# Patient Record
Sex: Male | Born: 1935 | Race: Black or African American | Hispanic: No | Smoking: Former smoker
Health system: Southern US, Community
[De-identification: ages and names within clinical notes are randomized; demographics above are authoritative.]

## PROBLEM LIST (undated history)

## (undated) DIAGNOSIS — N4340 Spermatocele of epididymis, unspecified: Secondary | ICD-10-CM

## (undated) DIAGNOSIS — M1990 Unspecified osteoarthritis, unspecified site: Secondary | ICD-10-CM

## (undated) DIAGNOSIS — I509 Heart failure, unspecified: Secondary | ICD-10-CM

## (undated) DIAGNOSIS — R339 Retention of urine, unspecified: Secondary | ICD-10-CM

## (undated) DIAGNOSIS — E291 Testicular hypofunction: Secondary | ICD-10-CM

## (undated) DIAGNOSIS — C61 Malignant neoplasm of prostate: Secondary | ICD-10-CM

## (undated) DIAGNOSIS — E119 Type 2 diabetes mellitus without complications: Secondary | ICD-10-CM

## (undated) DIAGNOSIS — M109 Gout, unspecified: Secondary | ICD-10-CM

## (undated) DIAGNOSIS — R350 Frequency of micturition: Secondary | ICD-10-CM

## (undated) DIAGNOSIS — N400 Enlarged prostate without lower urinary tract symptoms: Secondary | ICD-10-CM

## (undated) DIAGNOSIS — F0390 Unspecified dementia without behavioral disturbance: Secondary | ICD-10-CM

## (undated) DIAGNOSIS — T7840XA Allergy, unspecified, initial encounter: Secondary | ICD-10-CM

## (undated) DIAGNOSIS — F329 Major depressive disorder, single episode, unspecified: Secondary | ICD-10-CM

## (undated) DIAGNOSIS — N5089 Other specified disorders of the male genital organs: Secondary | ICD-10-CM

## (undated) DIAGNOSIS — I739 Peripheral vascular disease, unspecified: Secondary | ICD-10-CM

## (undated) DIAGNOSIS — M5430 Sciatica, unspecified side: Secondary | ICD-10-CM

## (undated) DIAGNOSIS — E785 Hyperlipidemia, unspecified: Secondary | ICD-10-CM

## (undated) DIAGNOSIS — N281 Cyst of kidney, acquired: Secondary | ICD-10-CM

## (undated) DIAGNOSIS — F419 Anxiety disorder, unspecified: Secondary | ICD-10-CM

## (undated) DIAGNOSIS — M5416 Radiculopathy, lumbar region: Secondary | ICD-10-CM

## (undated) DIAGNOSIS — R42 Dizziness and giddiness: Secondary | ICD-10-CM

## (undated) DIAGNOSIS — N139 Obstructive and reflux uropathy, unspecified: Secondary | ICD-10-CM

## (undated) DIAGNOSIS — R0600 Dyspnea, unspecified: Secondary | ICD-10-CM

## (undated) DIAGNOSIS — I2510 Atherosclerotic heart disease of native coronary artery without angina pectoris: Secondary | ICD-10-CM

## (undated) DIAGNOSIS — N430 Encysted hydrocele: Secondary | ICD-10-CM

## (undated) DIAGNOSIS — I639 Cerebral infarction, unspecified: Secondary | ICD-10-CM

## (undated) DIAGNOSIS — I7090 Unspecified atherosclerosis: Secondary | ICD-10-CM

## (undated) DIAGNOSIS — F32A Depression, unspecified: Secondary | ICD-10-CM

## (undated) DIAGNOSIS — R972 Elevated prostate specific antigen [PSA]: Secondary | ICD-10-CM

## (undated) DIAGNOSIS — I10 Essential (primary) hypertension: Secondary | ICD-10-CM

## (undated) DIAGNOSIS — N411 Chronic prostatitis: Secondary | ICD-10-CM

## (undated) DIAGNOSIS — N529 Male erectile dysfunction, unspecified: Secondary | ICD-10-CM

## (undated) DIAGNOSIS — R310 Gross hematuria: Secondary | ICD-10-CM

## (undated) DIAGNOSIS — I499 Cardiac arrhythmia, unspecified: Secondary | ICD-10-CM

## (undated) DIAGNOSIS — M48061 Spinal stenosis, lumbar region without neurogenic claudication: Secondary | ICD-10-CM

## (undated) DIAGNOSIS — H409 Unspecified glaucoma: Secondary | ICD-10-CM

## (undated) DIAGNOSIS — N62 Hypertrophy of breast: Secondary | ICD-10-CM

## (undated) DIAGNOSIS — K219 Gastro-esophageal reflux disease without esophagitis: Secondary | ICD-10-CM

## (undated) DIAGNOSIS — K649 Unspecified hemorrhoids: Secondary | ICD-10-CM

## (undated) HISTORY — DX: Unspecified osteoarthritis, unspecified site: M19.90

## (undated) HISTORY — DX: Type 2 diabetes mellitus without complications: E11.9

## (undated) HISTORY — DX: Testicular hypofunction: E29.1

## (undated) HISTORY — PX: APPENDECTOMY: SHX54

## (undated) HISTORY — DX: Other specified disorders of the male genital organs: N50.89

## (undated) HISTORY — DX: Gout, unspecified: M10.9

## (undated) HISTORY — DX: Essential (primary) hypertension: I10

## (undated) HISTORY — DX: Retention of urine, unspecified: R33.9

## (undated) HISTORY — DX: Obstructive and reflux uropathy, unspecified: N13.9

## (undated) HISTORY — DX: Unspecified hemorrhoids: K64.9

## (undated) HISTORY — DX: Male erectile dysfunction, unspecified: N52.9

## (undated) HISTORY — DX: Benign prostatic hyperplasia without lower urinary tract symptoms: N40.0

## (undated) HISTORY — DX: Allergy, unspecified, initial encounter: T78.40XA

## (undated) HISTORY — DX: Gross hematuria: R31.0

## (undated) HISTORY — DX: Sciatica, unspecified side: M54.30

## (undated) HISTORY — DX: Radiculopathy, lumbar region: M54.16

## (undated) HISTORY — DX: Spinal stenosis, lumbar region without neurogenic claudication: M48.061

## (undated) HISTORY — DX: Hypertrophy of breast: N62

## (undated) HISTORY — DX: Anxiety disorder, unspecified: F41.9

## (undated) HISTORY — DX: Elevated prostate specific antigen (PSA): R97.20

## (undated) HISTORY — DX: Hyperlipidemia, unspecified: E78.5

## (undated) HISTORY — DX: Cardiac arrhythmia, unspecified: I49.9

## (undated) HISTORY — DX: Cyst of kidney, acquired: N28.1

## (undated) HISTORY — DX: Encysted hydrocele: N43.0

## (undated) HISTORY — DX: Frequency of micturition: R35.0

## (undated) HISTORY — DX: Unspecified atherosclerosis: I70.90

## (undated) HISTORY — DX: Peripheral vascular disease, unspecified: I73.9

## (undated) HISTORY — DX: Chronic prostatitis: N41.1

## (undated) HISTORY — DX: Spermatocele of epididymis, unspecified: N43.40

## (undated) HISTORY — DX: Malignant neoplasm of prostate: C61

## (undated) HISTORY — PX: OTHER SURGICAL HISTORY: SHX169

---

## 2004-09-27 ENCOUNTER — Ambulatory Visit: Payer: Self-pay | Admitting: Unknown Physician Specialty

## 2004-12-13 ENCOUNTER — Ambulatory Visit: Payer: Self-pay | Admitting: Internal Medicine

## 2004-12-21 ENCOUNTER — Emergency Department: Payer: Self-pay | Admitting: Emergency Medicine

## 2005-01-24 ENCOUNTER — Ambulatory Visit: Payer: Self-pay | Admitting: Internal Medicine

## 2005-10-21 ENCOUNTER — Emergency Department: Payer: Self-pay | Admitting: Internal Medicine

## 2005-11-04 ENCOUNTER — Emergency Department: Payer: Self-pay | Admitting: Emergency Medicine

## 2005-11-07 ENCOUNTER — Emergency Department: Payer: Self-pay | Admitting: Emergency Medicine

## 2005-12-13 ENCOUNTER — Other Ambulatory Visit: Payer: Self-pay

## 2005-12-13 ENCOUNTER — Inpatient Hospital Stay: Payer: Self-pay | Admitting: Internal Medicine

## 2005-12-14 ENCOUNTER — Other Ambulatory Visit: Payer: Self-pay

## 2006-08-15 ENCOUNTER — Other Ambulatory Visit: Payer: Self-pay

## 2006-08-15 ENCOUNTER — Inpatient Hospital Stay: Payer: Self-pay | Admitting: Internal Medicine

## 2006-08-22 ENCOUNTER — Emergency Department: Payer: Self-pay | Admitting: Emergency Medicine

## 2006-08-22 ENCOUNTER — Other Ambulatory Visit: Payer: Self-pay

## 2007-01-09 ENCOUNTER — Ambulatory Visit: Payer: Self-pay | Admitting: Pain Medicine

## 2007-01-15 ENCOUNTER — Ambulatory Visit: Payer: Self-pay | Admitting: Pain Medicine

## 2007-03-15 ENCOUNTER — Ambulatory Visit: Payer: Self-pay | Admitting: Urology

## 2007-03-18 ENCOUNTER — Ambulatory Visit: Payer: Self-pay | Admitting: Pain Medicine

## 2007-03-31 ENCOUNTER — Ambulatory Visit: Payer: Self-pay | Admitting: Pain Medicine

## 2007-05-06 ENCOUNTER — Ambulatory Visit: Payer: Self-pay | Admitting: Pain Medicine

## 2007-05-12 ENCOUNTER — Ambulatory Visit: Payer: Self-pay | Admitting: Pain Medicine

## 2007-07-01 ENCOUNTER — Ambulatory Visit: Payer: Self-pay | Admitting: Pain Medicine

## 2007-07-16 ENCOUNTER — Ambulatory Visit: Payer: Self-pay | Admitting: Pain Medicine

## 2009-08-11 HISTORY — PX: PROSTATE BIOPSY: SHX241

## 2009-08-22 ENCOUNTER — Ambulatory Visit: Payer: Self-pay | Admitting: Urology

## 2009-08-24 ENCOUNTER — Ambulatory Visit: Payer: Self-pay | Admitting: Urology

## 2009-08-31 ENCOUNTER — Ambulatory Visit: Payer: Self-pay | Admitting: Urology

## 2009-09-16 ENCOUNTER — Ambulatory Visit: Payer: Self-pay | Admitting: Radiation Oncology

## 2009-10-14 HISTORY — PX: OTHER SURGICAL HISTORY: SHX169

## 2009-10-16 ENCOUNTER — Ambulatory Visit: Payer: Self-pay | Admitting: Radiation Oncology

## 2009-10-17 ENCOUNTER — Ambulatory Visit: Payer: Self-pay | Admitting: Radiation Oncology

## 2009-11-16 ENCOUNTER — Ambulatory Visit: Payer: Self-pay | Admitting: Radiation Oncology

## 2009-12-16 ENCOUNTER — Ambulatory Visit: Payer: Self-pay | Admitting: Radiation Oncology

## 2010-01-16 ENCOUNTER — Ambulatory Visit: Payer: Self-pay | Admitting: Radiation Oncology

## 2010-01-18 ENCOUNTER — Ambulatory Visit: Payer: Self-pay | Admitting: Radiation Oncology

## 2010-02-16 ENCOUNTER — Ambulatory Visit: Payer: Self-pay | Admitting: Radiation Oncology

## 2010-04-20 ENCOUNTER — Ambulatory Visit: Payer: Self-pay | Admitting: Urology

## 2010-05-18 ENCOUNTER — Ambulatory Visit: Payer: Self-pay | Admitting: Urology

## 2010-06-18 ENCOUNTER — Ambulatory Visit: Payer: Self-pay | Admitting: Urology

## 2010-07-21 ENCOUNTER — Ambulatory Visit: Payer: Self-pay | Admitting: Radiation Oncology

## 2010-08-17 ENCOUNTER — Ambulatory Visit: Payer: Self-pay | Admitting: Radiation Oncology

## 2011-07-23 ENCOUNTER — Ambulatory Visit: Payer: Self-pay | Admitting: Radiation Oncology

## 2011-08-17 ENCOUNTER — Ambulatory Visit: Payer: Self-pay | Admitting: Radiation Oncology

## 2011-10-03 ENCOUNTER — Ambulatory Visit: Payer: Self-pay | Admitting: Urology

## 2012-04-21 DIAGNOSIS — R339 Retention of urine, unspecified: Secondary | ICD-10-CM | POA: Insufficient documentation

## 2012-04-21 DIAGNOSIS — N4340 Spermatocele of epididymis, unspecified: Secondary | ICD-10-CM | POA: Insufficient documentation

## 2012-04-21 DIAGNOSIS — R350 Frequency of micturition: Secondary | ICD-10-CM | POA: Insufficient documentation

## 2012-04-21 DIAGNOSIS — Z8546 Personal history of malignant neoplasm of prostate: Secondary | ICD-10-CM | POA: Insufficient documentation

## 2012-04-21 DIAGNOSIS — M5430 Sciatica, unspecified side: Secondary | ICD-10-CM | POA: Insufficient documentation

## 2012-04-21 DIAGNOSIS — N430 Encysted hydrocele: Secondary | ICD-10-CM | POA: Insufficient documentation

## 2012-04-21 DIAGNOSIS — N529 Male erectile dysfunction, unspecified: Secondary | ICD-10-CM | POA: Insufficient documentation

## 2012-07-21 ENCOUNTER — Ambulatory Visit: Payer: Self-pay | Admitting: Radiation Oncology

## 2012-08-16 ENCOUNTER — Ambulatory Visit: Payer: Self-pay | Admitting: Radiation Oncology

## 2013-01-19 ENCOUNTER — Emergency Department: Payer: Self-pay | Admitting: Emergency Medicine

## 2013-01-19 LAB — CBC
HGB: 18.3 g/dL — ABNORMAL HIGH (ref 13.0–18.0)
MCH: 32.4 pg (ref 26.0–34.0)
MCHC: 35 g/dL (ref 32.0–36.0)
MCV: 92 fL (ref 80–100)
RDW: 14.4 % (ref 11.5–14.5)

## 2013-01-19 LAB — URINALYSIS, COMPLETE
Bacteria: NONE SEEN
Bilirubin,UR: NEGATIVE
Blood: NEGATIVE
Glucose,UR: NEGATIVE mg/dL (ref 0–75)

## 2013-01-19 LAB — COMPREHENSIVE METABOLIC PANEL
Anion Gap: 5 — ABNORMAL LOW (ref 7–16)
BUN: 10 mg/dL (ref 7–18)
Bilirubin,Total: 0.9 mg/dL (ref 0.2–1.0)
Calcium, Total: 9.7 mg/dL (ref 8.5–10.1)
Chloride: 103 mmol/L (ref 98–107)
Creatinine: 0.91 mg/dL (ref 0.60–1.30)
EGFR (Non-African Amer.): >60
Potassium: 4.2 mmol/L (ref 3.5–5.1)
SGOT(AST): 23 U/L (ref 15–37)

## 2013-04-20 ENCOUNTER — Encounter: Payer: Self-pay | Admitting: Podiatry

## 2013-04-20 ENCOUNTER — Ambulatory Visit (INDEPENDENT_AMBULATORY_CARE_PROVIDER_SITE_OTHER): Payer: Medicare Other | Admitting: Podiatry

## 2013-04-20 VITALS — BP 141/84 | HR 67 | Resp 18 | Ht 71.0 in | Wt 225.0 lb

## 2013-04-20 DIAGNOSIS — B351 Tinea unguium: Secondary | ICD-10-CM

## 2013-04-20 DIAGNOSIS — M79609 Pain in unspecified limb: Secondary | ICD-10-CM

## 2013-04-20 NOTE — Progress Notes (Signed)
  Subjective:    Patient ID: Darrell Clark, male    DOB: 1932-11-11, 79 y.o.   MRN: [id number]  HPI painful toe nails    Review of Systems  Constitutional: Negative.   HENT: Negative.   Eyes: Negative.   Cardiovascular: Negative.   Gastrointestinal: Negative.   Endocrine: Negative.   Genitourinary: Negative.   Allergic/Immunologic: Negative.   Neurological: Negative.   Hematological: Negative.   Psychiatric/Behavioral: Negative.        Objective:   Physical Exam: Palpable pulses bilateral lower extremity are intact. Nails are thick yellow dystrophic clinically mycotic.        Assessment & Plan:  Impression:. Pain in limb secondary to onychomycosis.  Plan: Debridement of nails 1 through 5 bilateral is cover service secondary to pain. Followup with him in 3 months.

## 2013-05-28 ENCOUNTER — Other Ambulatory Visit: Payer: Self-pay | Admitting: Urgent Care

## 2013-05-28 LAB — CBC WITH DIFFERENTIAL/PLATELET
Basophil #: 0 x10 3/mm 3
Basophil %: 0.6 %
Eosinophil #: 0.3 x10 3/mm 3
Eosinophil %: 4 %
HCT: 48.7 %
HGB: 16.2 g/dL
Lymphocyte %: 28.5 %
Lymphs Abs: 2.1 x10 3/mm 3
MCH: 31.1 pg
MCHC: 33.2 g/dL
MCV: 94 fL
Monocyte #: 0.9 "x10 3/mm "
Monocyte %: 11.6 %
Neutrophil #: 4.1 x10 3/mm 3
Neutrophil %: 55.3 %
Platelet: 174 x10 3/mm 3
RBC: 5.19 x10 6/mm 3
RDW: 14.5 %
WBC: 7.4 x10 3/mm 3

## 2013-05-28 LAB — COMPREHENSIVE METABOLIC PANEL WITH GFR
Albumin: 3.7 g/dL
Alkaline Phosphatase: 102 U/L
Anion Gap: 5 — ABNORMAL LOW
BUN: 12 mg/dL
Bilirubin,Total: 0.8 mg/dL
Calcium, Total: 9.4 mg/dL
Chloride: 108 mmol/L — ABNORMAL HIGH
Co2: 28 mmol/L
Creatinine: 0.93 mg/dL
EGFR (African American): >60
EGFR (Non-African Amer.): >60
Glucose: 83 mg/dL
Osmolality: 280
Potassium: 3.8 mmol/L
SGOT(AST): 26 U/L
SGPT (ALT): 25 U/L
Sodium: 141 mmol/L
Total Protein: 7.5 g/dL

## 2013-05-28 LAB — TSH: Thyroid Stimulating Horm: 1.51 u[IU]/mL

## 2013-06-02 ENCOUNTER — Ambulatory Visit: Payer: Self-pay | Admitting: Urgent Care

## 2013-07-22 ENCOUNTER — Ambulatory Visit: Payer: Self-pay | Admitting: Radiation Oncology

## 2013-07-22 ENCOUNTER — Ambulatory Visit: Payer: Self-pay | Admitting: Gastroenterology

## 2013-07-25 LAB — PATHOLOGY REPORT

## 2013-08-17 ENCOUNTER — Ambulatory Visit (INDEPENDENT_AMBULATORY_CARE_PROVIDER_SITE_OTHER): Payer: Commercial Managed Care - HMO | Admitting: Podiatry

## 2013-08-17 ENCOUNTER — Ambulatory Visit (INDEPENDENT_AMBULATORY_CARE_PROVIDER_SITE_OTHER): Payer: Commercial Managed Care - HMO

## 2013-08-17 VITALS — BP 116/67 | HR 83 | Resp 16 | Ht 71.0 in | Wt 230.0 lb

## 2013-08-17 DIAGNOSIS — M79609 Pain in unspecified limb: Secondary | ICD-10-CM

## 2013-08-17 DIAGNOSIS — B351 Tinea unguium: Secondary | ICD-10-CM

## 2013-08-17 DIAGNOSIS — M79673 Pain in unspecified foot: Secondary | ICD-10-CM

## 2013-08-17 DIAGNOSIS — M109 Gout, unspecified: Secondary | ICD-10-CM

## 2013-08-17 NOTE — Progress Notes (Signed)
He presents today chief complaint of painful toenails one through 5 bilateral.  Objective: Vital signs are stable he is alert and oriented x3.

## 2013-10-19 ENCOUNTER — Ambulatory Visit (INDEPENDENT_AMBULATORY_CARE_PROVIDER_SITE_OTHER): Payer: Medicare HMO | Admitting: Podiatry

## 2013-10-19 ENCOUNTER — Encounter: Payer: Self-pay | Admitting: Podiatry

## 2013-10-19 VITALS — BP 118/63 | HR 80 | Resp 18

## 2013-10-19 DIAGNOSIS — E1149 Type 2 diabetes mellitus with other diabetic neurological complication: Secondary | ICD-10-CM

## 2013-10-19 DIAGNOSIS — M2040 Other hammer toe(s) (acquired), unspecified foot: Secondary | ICD-10-CM | POA: Diagnosis not present

## 2013-10-19 NOTE — Progress Notes (Signed)
Measured for shoes 12.5 m 517 black shoe. Am also having numbness and tingling in my toes. The only hurt before of the shoes on and they do not hurt at night.  Objective: Vital signs are stable he is alert and oriented x3. Pulses are palpable bilateral. Hammertoe deformities are noted bilateral. No ulcerations. Decrease in sensorium per Semmes-Weinstein monofilament to the toes only.  Assessment: Diabetic peripheral neuropathy very early stage bilateral foot. Hammertoe deformities  Plan: Do to his early stages I suggest we do not start her medications at this point.

## 2013-11-02 ENCOUNTER — Encounter: Payer: Self-pay | Admitting: *Deleted

## 2013-11-02 NOTE — Progress Notes (Signed)
Sent pt post card letting him know diabetic inserts are here.

## 2013-11-16 HISTORY — PX: CARDIAC CATHETERIZATION: SHX172

## 2013-11-25 ENCOUNTER — Ambulatory Visit: Payer: Self-pay | Admitting: Internal Medicine

## 2013-12-09 ENCOUNTER — Ambulatory Visit (INDEPENDENT_AMBULATORY_CARE_PROVIDER_SITE_OTHER): Payer: Medicare HMO | Admitting: Podiatry

## 2013-12-09 VITALS — BP 129/78 | HR 76 | Resp 16

## 2013-12-09 DIAGNOSIS — E1149 Type 2 diabetes mellitus with other diabetic neurological complication: Secondary | ICD-10-CM

## 2013-12-09 DIAGNOSIS — M79673 Pain in unspecified foot: Secondary | ICD-10-CM

## 2013-12-09 DIAGNOSIS — M79609 Pain in unspecified limb: Secondary | ICD-10-CM | POA: Diagnosis not present

## 2013-12-09 DIAGNOSIS — M2040 Other hammer toe(s) (acquired), unspecified foot: Secondary | ICD-10-CM

## 2013-12-09 DIAGNOSIS — B351 Tinea unguium: Secondary | ICD-10-CM | POA: Diagnosis not present

## 2013-12-09 NOTE — Progress Notes (Signed)
He presented today to pick up his diabetic shoes and have his nail debridement 1 through 5 bilateral.  Objective: Pulses are strongly palpable bilateral. Nails are thick yellow dystrophic with mycotic and painful palpation.  Assessment: Pain in limb secondary to onychomycosis and diabetes.  Plan: Dispensed diabetic shoes debrided nails 1 through 5 bilateral covered service secondary to pain.

## 2013-12-21 ENCOUNTER — Ambulatory Visit: Payer: Commercial Managed Care - HMO | Admitting: Podiatry

## 2014-03-22 ENCOUNTER — Ambulatory Visit (INDEPENDENT_AMBULATORY_CARE_PROVIDER_SITE_OTHER): Payer: Medicare HMO | Admitting: Podiatry

## 2014-03-22 DIAGNOSIS — M79676 Pain in unspecified toe(s): Secondary | ICD-10-CM

## 2014-03-22 DIAGNOSIS — B351 Tinea unguium: Secondary | ICD-10-CM

## 2014-03-22 NOTE — Progress Notes (Signed)
He presents today chief complaint of painful elongated toenails.  Objective: Nails are thick yellow dystrophic with mycotic painful palpation.  Assessment: Pain in limb secondary to onychomycosis 1 through 5 bilateral.  Plan: Debridement of nails in thickness and length as cover service secondary to pain.

## 2014-07-15 ENCOUNTER — Ambulatory Visit: Payer: Self-pay | Admitting: Radiation Oncology

## 2014-07-17 LAB — PSA: PSA: 0.5 ng/mL (ref 0.0–4.0)

## 2014-07-19 ENCOUNTER — Ambulatory Visit (INDEPENDENT_AMBULATORY_CARE_PROVIDER_SITE_OTHER): Payer: PPO | Admitting: Podiatry

## 2014-07-19 ENCOUNTER — Ambulatory Visit: Payer: Medicare HMO | Admitting: Podiatry

## 2014-07-19 ENCOUNTER — Ambulatory Visit: Payer: Self-pay | Admitting: Radiation Oncology

## 2014-07-19 DIAGNOSIS — B351 Tinea unguium: Secondary | ICD-10-CM

## 2014-07-19 DIAGNOSIS — M79676 Pain in unspecified toe(s): Secondary | ICD-10-CM

## 2014-07-19 DIAGNOSIS — E1142 Type 2 diabetes mellitus with diabetic polyneuropathy: Secondary | ICD-10-CM

## 2014-07-19 NOTE — Progress Notes (Signed)
He presents today chief complaint of painful elongated toenails. He would also like to consider a new pair of diabetic shoes.  Objective: Nails are thick yellow dystrophic with mycotic painful palpation. Pulses are strongly palpable bilateral. Neurologic sensorium is decreased per Semmes-Weinstein monofilament. Hammertoe deformities are noted bilateral.  Assessment: Pain in limb secondary to onychomycosis 1 through 5 bilateral. Diabetic peripheral neuropathy with hammertoe deformities bilateral.  Plan: Debridement of nails in thickness and length as cover service secondary to pain. He was scanned for a set of diabetic shoes today. I will follow-up with him in 3 months

## 2014-08-04 ENCOUNTER — Telehealth: Payer: Self-pay | Admitting: Podiatry

## 2014-08-04 ENCOUNTER — Encounter: Payer: Self-pay | Admitting: Podiatry

## 2014-08-04 NOTE — Telephone Encounter (Signed)
Mrs. Grunder called in regards to Darrell Clark diabetic shoes. She stated they received a call that they had arrived. I tried to schedule an appointment but she was asking questions. Wanted to know if we are in network with Darrell Clark insurance. I told her yes. She then wanted to know if he would be responsible to pay anything. I told her when he came in to pick up his shoes he would be responsible to pay his co-pay. She stated she called the insurance company and they told her if we were filing in network they would not be responsible for anything but if we are filing out of network he would be responsible for 20%. I explained that when a doctor charges an office visit we are required to collect the co-pay. I said we could bill the insurance and if they owe anything we would send a bill. The patient said no either we don't pay anything if we are in network or we pay 20% if its out of network. I tried to schedule an appointment but they did not want to schedule one at this time, stating they had another doctor appointment to make.

## 2014-08-16 ENCOUNTER — Encounter: Payer: Self-pay | Admitting: Podiatry

## 2014-08-16 ENCOUNTER — Ambulatory Visit (INDEPENDENT_AMBULATORY_CARE_PROVIDER_SITE_OTHER): Payer: PPO | Admitting: Podiatry

## 2014-08-16 DIAGNOSIS — E1142 Type 2 diabetes mellitus with diabetic polyneuropathy: Secondary | ICD-10-CM

## 2014-08-16 DIAGNOSIS — M79673 Pain in unspecified foot: Secondary | ICD-10-CM

## 2014-08-16 NOTE — Progress Notes (Signed)
Dispensed diabetic shoes and 3 pairs of insoles. Instructions were reviewed and a copy was given to the patient. Patient to reappointment for regularly scheduled diabetic foot care visits or if he experiences any trouble with diabetic shoes.

## 2014-08-16 NOTE — Patient Instructions (Signed)

## 2014-08-17 DIAGNOSIS — I2510 Atherosclerotic heart disease of native coronary artery without angina pectoris: Secondary | ICD-10-CM | POA: Insufficient documentation

## 2014-08-31 DIAGNOSIS — I340 Nonrheumatic mitral (valve) insufficiency: Secondary | ICD-10-CM | POA: Insufficient documentation

## 2014-08-31 DIAGNOSIS — I350 Nonrheumatic aortic (valve) stenosis: Secondary | ICD-10-CM | POA: Insufficient documentation

## 2014-11-22 ENCOUNTER — Ambulatory Visit (INDEPENDENT_AMBULATORY_CARE_PROVIDER_SITE_OTHER): Payer: PPO | Admitting: Podiatry

## 2014-11-22 DIAGNOSIS — M79676 Pain in unspecified toe(s): Secondary | ICD-10-CM

## 2014-11-22 DIAGNOSIS — B351 Tinea unguium: Secondary | ICD-10-CM

## 2014-11-22 NOTE — Progress Notes (Signed)
He presents today chief complaint of painful elongated toenails. He would also like to consider a new pair of diabetic shoes.  Objective: Nails are thick yellow dystrophic with mycotic painful palpation. Pulses are strongly palpable bilateral. Neurologic sensorium is decreased per Semmes-Weinstein monofilament. Hammertoe deformities are noted bilateral.  Assessment: Pain in limb secondary to onychomycosis 1 through 5 bilateral. Diabetic peripheral neuropathy with hammertoe deformities bilateral.  Plan: Debridement of nails in thickness and length as cover service secondary to pain. He was scanned for a set of diabetic shoes today. I will follow-up with him in 3 months

## 2015-01-10 DIAGNOSIS — I071 Rheumatic tricuspid insufficiency: Secondary | ICD-10-CM | POA: Insufficient documentation

## 2015-02-28 ENCOUNTER — Ambulatory Visit (INDEPENDENT_AMBULATORY_CARE_PROVIDER_SITE_OTHER): Payer: PPO | Admitting: Podiatry

## 2015-02-28 DIAGNOSIS — E1142 Type 2 diabetes mellitus with diabetic polyneuropathy: Secondary | ICD-10-CM

## 2015-02-28 DIAGNOSIS — B351 Tinea unguium: Secondary | ICD-10-CM

## 2015-02-28 DIAGNOSIS — M79676 Pain in unspecified toe(s): Secondary | ICD-10-CM | POA: Diagnosis not present

## 2015-02-28 NOTE — Progress Notes (Signed)
He presents today chief complaint of painful elongated toenails.   Objective: Nails are thick yellow dystrophic with mycotic painful palpation. Pulses are strongly palpable bilateral. Neurologic sensorium is decreased per Semmes-Weinstein monofilament. Hammertoe deformities are noted bilateral.  Assessment: Pain in limb secondary to onychomycosis 1 through 5 bilateral. Diabetic peripheral neuropathy with hammertoe deformities bilateral.  Plan: Debridement of nails in thickness and length as cover service secondary to plan.    3 months  Roselind Messier DPM

## 2015-06-27 ENCOUNTER — Ambulatory Visit: Payer: PPO

## 2015-06-27 ENCOUNTER — Ambulatory Visit (INDEPENDENT_AMBULATORY_CARE_PROVIDER_SITE_OTHER): Payer: PPO | Admitting: Podiatry

## 2015-06-27 DIAGNOSIS — B351 Tinea unguium: Secondary | ICD-10-CM | POA: Diagnosis not present

## 2015-06-27 DIAGNOSIS — E1142 Type 2 diabetes mellitus with diabetic polyneuropathy: Secondary | ICD-10-CM | POA: Diagnosis not present

## 2015-06-27 DIAGNOSIS — M79676 Pain in unspecified toe(s): Secondary | ICD-10-CM

## 2015-06-27 NOTE — Progress Notes (Signed)
He presents today with a chief complaint of painful toenails bilateral.  Objective: Vital signs are stable he is alert and oriented 3. Pulses are strongly palpable bilateral. Toenails are thick yellow dystrophic with mycotic and painful palpation as well as debridement. Diabetic peripheral neuropathy with decreased sensorium persimmons Weinstein monofilament.  Assessment: Diabetic peripheral neuropathy with pain in limb secondary to onychomycosis.  Plan: Debrided nails 1 through 5 bilateral. And he was scanned for set of diabetic shoes.

## 2015-07-29 ENCOUNTER — Ambulatory Visit: Payer: Self-pay | Admitting: Radiation Oncology

## 2015-08-05 ENCOUNTER — Ambulatory Visit: Admission: RE | Admit: 2015-08-05 | Payer: Self-pay | Source: Ambulatory Visit | Admitting: Radiation Oncology

## 2015-08-05 ENCOUNTER — Ambulatory Visit: Payer: Self-pay | Admitting: Radiation Oncology

## 2015-08-17 ENCOUNTER — Ambulatory Visit: Payer: PPO

## 2015-09-21 ENCOUNTER — Ambulatory Visit: Payer: PPO

## 2015-09-21 ENCOUNTER — Encounter: Payer: Self-pay | Admitting: Podiatry

## 2015-09-21 ENCOUNTER — Ambulatory Visit: Payer: PPO | Admitting: Podiatry

## 2015-09-21 ENCOUNTER — Ambulatory Visit (INDEPENDENT_AMBULATORY_CARE_PROVIDER_SITE_OTHER): Payer: PPO | Admitting: Podiatry

## 2015-09-21 DIAGNOSIS — E1142 Type 2 diabetes mellitus with diabetic polyneuropathy: Secondary | ICD-10-CM

## 2015-09-21 DIAGNOSIS — M2040 Other hammer toe(s) (acquired), unspecified foot: Secondary | ICD-10-CM | POA: Diagnosis not present

## 2015-09-21 DIAGNOSIS — M79676 Pain in unspecified toe(s): Principal | ICD-10-CM

## 2015-09-21 DIAGNOSIS — B351 Tinea unguium: Secondary | ICD-10-CM

## 2015-09-21 NOTE — Progress Notes (Signed)
He presents today with a chief complaint of painful elongated toenails and he would like to pick up his diabetic shoes.  Objective: Vital signs are stable alert and oriented 3. Pulses are palpable. Toenails are thick yellow dystrophic onychomycotic and painful on palpation.  Assessment: Pain limb secondary to onychomycosis and diabetes.  Plan: Debridement of toenails 1 through 5 bilateral covered service secondary to pain. Also he Is diabetic shoes today and I will follow-up with him in 3 months.

## 2015-09-22 NOTE — Progress Notes (Signed)
Dispensed diabetic shoes and 3 pairs of insoles. Instructions were reviewed and a copy was given to the patient. Patient to reappointment for regularly scheduled diabetic foot care visits or if he experiences any trouble with her diabetic shoes. 

## 2015-09-24 DIAGNOSIS — M160 Bilateral primary osteoarthritis of hip: Secondary | ICD-10-CM | POA: Insufficient documentation

## 2015-09-26 ENCOUNTER — Ambulatory Visit: Payer: PPO | Admitting: Podiatry

## 2015-10-18 ENCOUNTER — Other Ambulatory Visit: Payer: Self-pay | Admitting: Orthopedic Surgery

## 2015-10-18 DIAGNOSIS — M5442 Lumbago with sciatica, left side: Principal | ICD-10-CM

## 2015-10-18 DIAGNOSIS — G8929 Other chronic pain: Secondary | ICD-10-CM

## 2015-10-18 DIAGNOSIS — M5441 Lumbago with sciatica, right side: Principal | ICD-10-CM

## 2015-10-19 ENCOUNTER — Ambulatory Visit: Payer: PPO | Admitting: Podiatry

## 2015-10-20 ENCOUNTER — Ambulatory Visit
Admission: RE | Admit: 2015-10-20 | Discharge: 2015-10-20 | Disposition: A | Discharge status: Home / Self Care | Payer: PPO | Source: Ambulatory Visit | Attending: Orthopedic Surgery | Admitting: Orthopedic Surgery

## 2015-10-20 DIAGNOSIS — G8929 Other chronic pain: Secondary | ICD-10-CM

## 2015-10-20 DIAGNOSIS — M4806 Spinal stenosis, lumbar region: Secondary | ICD-10-CM | POA: Diagnosis not present

## 2015-10-20 DIAGNOSIS — M5442 Lumbago with sciatica, left side: Secondary | ICD-10-CM | POA: Diagnosis present

## 2015-10-20 DIAGNOSIS — M5441 Lumbago with sciatica, right side: Secondary | ICD-10-CM | POA: Diagnosis present

## 2015-11-11 DIAGNOSIS — M48061 Spinal stenosis, lumbar region without neurogenic claudication: Secondary | ICD-10-CM

## 2015-11-11 DIAGNOSIS — M5416 Radiculopathy, lumbar region: Secondary | ICD-10-CM

## 2015-11-11 HISTORY — DX: Spinal stenosis, lumbar region without neurogenic claudication: M48.061

## 2015-11-11 HISTORY — DX: Radiculopathy, lumbar region: M54.16

## 2015-12-21 ENCOUNTER — Encounter: Payer: Self-pay | Admitting: Podiatry

## 2015-12-21 ENCOUNTER — Ambulatory Visit (INDEPENDENT_AMBULATORY_CARE_PROVIDER_SITE_OTHER): Payer: PPO | Admitting: Podiatry

## 2015-12-21 DIAGNOSIS — M79676 Pain in unspecified toe(s): Secondary | ICD-10-CM | POA: Diagnosis not present

## 2015-12-21 DIAGNOSIS — B351 Tinea unguium: Secondary | ICD-10-CM

## 2015-12-21 DIAGNOSIS — E1142 Type 2 diabetes mellitus with diabetic polyneuropathy: Secondary | ICD-10-CM | POA: Diagnosis not present

## 2015-12-21 NOTE — Progress Notes (Signed)
He presents today for chief complaint of painful elongated toenails he also states that his wife is been sick and in the hospital.  Objective: Vital signs are stable alert and oriented 3 pulses are palpable. His toenails are thick yellow dystrophic with mycotic painful palpation and debridement.  Assessment: Pain and limp secondary to onychomycosis.  Plan: Debridement of toenails 1 through 5 bilateral.

## 2016-03-26 ENCOUNTER — Encounter: Payer: Self-pay | Admitting: Podiatry

## 2016-03-26 ENCOUNTER — Ambulatory Visit (INDEPENDENT_AMBULATORY_CARE_PROVIDER_SITE_OTHER): Payer: PPO | Admitting: Podiatry

## 2016-03-26 DIAGNOSIS — B351 Tinea unguium: Secondary | ICD-10-CM

## 2016-03-26 DIAGNOSIS — M79676 Pain in unspecified toe(s): Secondary | ICD-10-CM

## 2016-03-26 DIAGNOSIS — L600 Ingrowing nail: Secondary | ICD-10-CM | POA: Diagnosis not present

## 2016-03-26 DIAGNOSIS — E1142 Type 2 diabetes mellitus with diabetic polyneuropathy: Secondary | ICD-10-CM

## 2016-03-26 MED ORDER — NEOMYCIN-POLYMYXIN-HC 1 % OT SOLN: OTIC | 1 refills | Status: DC

## 2016-03-26 NOTE — Patient Instructions (Signed)

## 2016-03-27 NOTE — Progress Notes (Signed)
Mr. Arrizon presents today with a chief complaint of a painful ingrown toenail to the fibular border of the hallux right. He states that the remainder of his toenails are long and need to be trimmed as well. He states that his diabetes is under good control and he has no problems with his feet.  Objective: Vital signs are stable he is alert and oriented 3. Pulses are palpable. Neurologic sensorium is intact. Deep tendon reflexes are intact.. Orthopedic evaluation demonstrates all joints distal to the ankle full range of motion without crepitation. Cutaneous evaluation Mr. is supple well-hydrated cutis no erythema edema cellulitis drainage or odor. A sharp greater nail margin to the inferior border hallux right. Otherwise his toenails are thick yellow dystrophic with mycotic bilateral. No open lesions or wounds other than the mild paronychia associated with the ingrown nail hallux right.  Assessment: Pain in limb secondary to onychomycosis and diabetes mellitus. Ingrown nail. The border hallux right.  Plan: Debridement nails 1 through 5 bilateral today also debrided the field were the hallux right. This is performed with a chemical matrixectomy after local anesthesia was administered. He tolerated this procedure well without complications. He was provided with oral and written home-going instructions for care and soaking of his toes. He was also provided with a prescription for Cortisporin Otic to be applied twice daily after soaking. I will follow-up with him in approximately 2 weeks for nail check.

## 2016-04-11 ENCOUNTER — Ambulatory Visit (INDEPENDENT_AMBULATORY_CARE_PROVIDER_SITE_OTHER): Payer: PPO | Admitting: Podiatry

## 2016-04-11 ENCOUNTER — Encounter: Payer: Self-pay | Admitting: Podiatry

## 2016-04-11 DIAGNOSIS — L600 Ingrowing nail: Secondary | ICD-10-CM | POA: Diagnosis not present

## 2016-04-11 NOTE — Progress Notes (Signed)
He presents today for follow-up of his matrixectomy of his hallux nail right. He states this seems to be doing good and he hasn't been soaking it. He states that his diabetes is under control.  Objective: Vital signs are stable alert and oriented 3. Pulses are palpable. No erythema edema saline as drainage or odor to the fibular border hallux right. Appears to have gone on to heal uneventfully.  Assessment: Well-healing surgical toe hallux right.  Plan: Continue soaks until completely resolved no defined me with questions or concerns. Otherwise he will leave this covered during the day and open at night.

## 2016-04-14 ENCOUNTER — Encounter: Payer: Self-pay | Admitting: Emergency Medicine

## 2016-04-14 ENCOUNTER — Observation Stay
Admission: EM | Admit: 2016-04-14 | Discharge: 2016-04-16 | Disposition: A | Discharge status: Skilled Nursing Facility | Payer: PPO | Attending: Internal Medicine | Admitting: Internal Medicine

## 2016-04-14 DIAGNOSIS — M1990 Unspecified osteoarthritis, unspecified site: Secondary | ICD-10-CM | POA: Diagnosis not present

## 2016-04-14 DIAGNOSIS — H53462 Homonymous bilateral field defects, left side: Principal | ICD-10-CM | POA: Insufficient documentation

## 2016-04-14 DIAGNOSIS — Z8546 Personal history of malignant neoplasm of prostate: Secondary | ICD-10-CM | POA: Diagnosis not present

## 2016-04-14 DIAGNOSIS — Z7902 Long term (current) use of antithrombotics/antiplatelets: Secondary | ICD-10-CM | POA: Insufficient documentation

## 2016-04-14 DIAGNOSIS — Z7982 Long term (current) use of aspirin: Secondary | ICD-10-CM | POA: Insufficient documentation

## 2016-04-14 DIAGNOSIS — F419 Anxiety disorder, unspecified: Secondary | ICD-10-CM | POA: Diagnosis not present

## 2016-04-14 DIAGNOSIS — K219 Gastro-esophageal reflux disease without esophagitis: Secondary | ICD-10-CM | POA: Diagnosis present

## 2016-04-14 DIAGNOSIS — I6612 Occlusion and stenosis of left anterior cerebral artery: Secondary | ICD-10-CM | POA: Diagnosis not present

## 2016-04-14 DIAGNOSIS — M6281 Muscle weakness (generalized): Secondary | ICD-10-CM

## 2016-04-14 DIAGNOSIS — I2510 Atherosclerotic heart disease of native coronary artery without angina pectoris: Secondary | ICD-10-CM | POA: Diagnosis not present

## 2016-04-14 DIAGNOSIS — H3412 Central retinal artery occlusion, left eye: Secondary | ICD-10-CM | POA: Insufficient documentation

## 2016-04-14 DIAGNOSIS — Z8249 Family history of ischemic heart disease and other diseases of the circulatory system: Secondary | ICD-10-CM | POA: Insufficient documentation

## 2016-04-14 DIAGNOSIS — I679 Cerebrovascular disease, unspecified: Secondary | ICD-10-CM

## 2016-04-14 DIAGNOSIS — Z794 Long term (current) use of insulin: Secondary | ICD-10-CM | POA: Diagnosis not present

## 2016-04-14 DIAGNOSIS — I639 Cerebral infarction, unspecified: Secondary | ICD-10-CM | POA: Diagnosis present

## 2016-04-14 DIAGNOSIS — E119 Type 2 diabetes mellitus without complications: Secondary | ICD-10-CM

## 2016-04-14 DIAGNOSIS — I10 Essential (primary) hypertension: Secondary | ICD-10-CM | POA: Diagnosis present

## 2016-04-14 DIAGNOSIS — I63422 Cerebral infarction due to embolism of left anterior cerebral artery: Secondary | ICD-10-CM

## 2016-04-14 DIAGNOSIS — E785 Hyperlipidemia, unspecified: Secondary | ICD-10-CM | POA: Diagnosis present

## 2016-04-14 DIAGNOSIS — R4781 Slurred speech: Secondary | ICD-10-CM | POA: Diagnosis not present

## 2016-04-14 DIAGNOSIS — Z87891 Personal history of nicotine dependence: Secondary | ICD-10-CM | POA: Insufficient documentation

## 2016-04-14 DIAGNOSIS — H53139 Sudden visual loss, unspecified eye: Secondary | ICD-10-CM

## 2016-04-14 HISTORY — DX: Malignant neoplasm of prostate: C61

## 2016-04-14 HISTORY — DX: Gastro-esophageal reflux disease without esophagitis: K21.9

## 2016-04-14 HISTORY — DX: Hyperlipidemia, unspecified: E78.5

## 2016-04-14 HISTORY — DX: Atherosclerotic heart disease of native coronary artery without angina pectoris: I25.10

## 2016-04-14 NOTE — ED Provider Notes (Signed)
[facility] Emergency Department Provider Note    First MD Initiated Contact with Patient 04/14/16 2344     (approximate)  I have reviewed the triage vital signs and the nursing notes.   HISTORY  Chief Complaint Visual Field Change (Pt. states vision change in lt. eye)   HPI Darrell Clark is a 82 y.o. male with history of hypertension and diabetes presents to the emergency department with history of slurred speech with onset 11 AM this morning which subsequently resolved. Patient stated at 10 PM tonight started to experience loss of vision in his left eye and has been intermittently losing vision in that eye since that time.   Past Medical History:  Diagnosis Date  . Allergy   . Anxiety   . Arthritis   . CAD (coronary artery disease)   . Diabetes mellitus without complication ([place])   . GERD (gastroesophageal reflux disease)   . HLD (hyperlipidemia)   . Hypertension   . Prostate cancer [facility])     Patient Active Problem List   Diagnosis Date Noted  . CVA (cerebral vascular accident) ([place]) 04/15/2016  . HTN (hypertension) 04/15/2016  . Diabetes ([place]) 04/15/2016  . GERD (gastroesophageal reflux disease) 04/15/2016  . HLD (hyperlipidemia) 04/15/2016    Past Surgical History:  Procedure Laterality Date  . APPENDECTOMY      Prior to Admission medications   Medication Sig Start Date End Date Taking? Authorizing Provider  amLODipine (NORVASC) 5 MG tablet Take 5 mg by mouth daily.  02/21/13  Yes Historical Provider, MD  benazepril (LOTENSIN) 20 MG tablet Take 20 mg by mouth daily.  02/21/13  Yes Historical Provider, MD  donepezil (ARICEPT) 10 MG tablet Take 10 mg by mouth at bedtime.  02/21/13  Yes Historical Provider, MD  GLIPIZIDE XL 5 MG 24 hr tablet Take 5 mg by mouth daily with breakfast.  02/21/13  Yes Historical Provider, MD  NEOMYCIN-POLYMYXIN-HYDROCORTISONE (CORTISPORIN) 1 % SOLN otic solution Apply 1-2 drops to toe BID after soaking 03/26/16   Yes Max T [place], DPM  ONE TOUCH ULTRA TEST test strip  04/16/13  Yes Historical Provider, MD  Glory Rosebush DELICA LANCETS [id number] [place]  04/16/13  Yes Historical Provider, MD  simvastatin (ZOCOR) 20 MG tablet Take 20 mg by mouth daily at 6 PM.  03/09/13  Yes Historical Provider, MD  fluticasone (FLONASE) 50 MCG/ACT nasal spray Place 2 sprays into both nostrils daily.  04/16/13   Historical Provider, MD  lactulose (CHRONULAC) 10 GM/15ML solution  01/27/13   Historical Provider, MD  omeprazole (PRILOSEC) 20 MG capsule  07/24/13   Historical Provider, MD  pantoprazole (PROTONIX) 40 MG tablet Take 40 mg by mouth daily.  08/06/13   Historical Provider, MD    Allergies Niacin and Niacin and related  Family History  Problem Relation Age of Onset  . Heart attack Mother   . Hypertension Mother   . Heart attack Father   . Hypertension Father   . Hypertension Sister   . Cancer Brother   . Hypertension Brother     Social History Social History  Substance Use Topics  . Smoking status: Former Research scientist (life sciences)  . Smokeless tobacco: Never Used     Comment: quit 10 years ago   . Alcohol use No    Review of Systems Constitutional: No fever/chills Eyes: Left eye decreased vision ENT: No sore throat. Cardiovascular: Denies chest pain. Respiratory: Denies shortness of breath. Gastrointestinal: No abdominal pain.  No nausea, no vomiting.  No  diarrhea.  No constipation. Genitourinary: Negative for dysuria. Musculoskeletal: Negative for back pain. Skin: Negative for rash. Neurological: Negative for headaches, focal weakness or numbness.  10-point ROS otherwise negative.  ____________________________________________   PHYSICAL EXAM:  VITAL SIGNS: ED Triage Vitals  Enc Vitals Group     BP 04/14/16 2337 (!) 147/114     Pulse Rate 04/14/16 2337 64     Resp 04/14/16 2337 18     Temp 04/14/16 2337 98.3 F (36.8 C)     Temp src --      SpO2 04/14/16 2330 96 %     Weight 04/14/16 2337 225 lb (102.1 kg)      Height 04/14/16 2337 5\' 11"  (1.803 m)     Head Circumference --      [place] --      Pain Score --      Pain Loc --      Pain Edu? --      Excl. in [place]? --     Constitutional: Alert and oriented. Well appearing and in no acute distress. Eyes: Conjunctivae are normal. PERRL. EOMI. Head: Atraumatic. Ears:  Healthy appearing ear canals and TMs bilaterally Nose: No congestion/rhinnorhea. Mouth/Throat: Mucous membranes are moist.  Oropharynx non-erythematous. Neck: No stridor.  No meningeal signs.  No cervical spine tenderness to palpation. Cardiovascular: Normal rate, regular rhythm. Good peripheral circulation. Grossly normal heart sounds. Respiratory: Normal respiratory effort.  No retractions. Lungs CTAB. Gastrointestinal: Soft and nontender. No distention.  Musculoskeletal: No lower extremity tenderness nor edema. No gross deformities of extremities. Neurologic:  Normal speech and language. No gross focal neurologic deficits are appreciated.  Skin:  Skin is warm, dry and intact. No rash noted. Psychiatric: Mood and affect are normal. Speech and behavior are normal.  ____________________________________________   LABS (all labs ordered are listed, but only abnormal results are displayed)  Labs Reviewed  APTT - Abnormal; Notable for the following:       Result Value   aPTT <24 (*)    All other components within normal limits  COMPREHENSIVE METABOLIC PANEL - Abnormal; Notable for the following:    Glucose, Bld 102 (*)    All other components within normal limits  GLUCOSE, CAPILLARY - Abnormal; Notable for the following:    Glucose-Capillary 111 (*)    All other components within normal limits  LIPID PANEL - Abnormal; Notable for the following:    HDL 31 (*)    All other components within normal limits  GLUCOSE, CAPILLARY - Abnormal; Notable for the following:    Glucose-Capillary 132 (*)    All other components within normal limits  GLUCOSE, CAPILLARY - Abnormal; Notable  for the following:    Glucose-Capillary 143 (*)    All other components within normal limits  GLUCOSE, CAPILLARY - Abnormal; Notable for the following:    Glucose-Capillary 136 (*)    All other components within normal limits  ETHANOL  PROTIME-INR  CBC  TROPONIN I  TSH  HEMOGLOBIN A1C   ____________________________________________  EKG  ED ECG REPORT I, [place] Chrissy Ealey, the attending physician, personally viewed and interpreted this ECG.   Date: 04/15/2016  EKG Time: 11:36 PM  Rate: 65  Rhythm: Normal sinus rhythm  Axis: Normal  Intervals: Normal  ST&T Change: None  ____________________________________________  RADIOLOGY I, [place] Wade Asebedo, personally viewed and evaluated these images (plain radiographs) as part of my medical decision making, as well as reviewing the written report by the radiologist.  Ct Angio Head  W Or Wo Contrast  Result Date: 04/15/2016 CLINICAL DATA:  Initial valuation for dizziness, slurred speech. Left visual changes. EXAM: CT ANGIOGRAPHY HEAD TECHNIQUE: Multidetector CT imaging of the head was performed using the standard protocol during bolus administration of intravenous contrast. Multiplanar CT image reconstructions and MIPs were obtained to evaluate the vascular anatomy. CONTRAST:  75 cc of Isovue view 370. COMPARISON:  Prior CT from 08/15/2006. FINDINGS: CT HEAD Brain: Cerebral volume normal for age. Minimal chronic microvascular ischemic disease, also felt to be within normal limits for age. Remote lacunar infarct within the right thalamus noted. Negative for acute intracranial hemorrhage. No acute infarct identified. No mass lesion, midline shift or mass effect. No hydrocephalus. No extra-axial fluid collection. Vascular: Prominent vascular calcifications are within the carotid siphons. Skull: Scalp soft tissues demonstrate no acute abnormality. Sinuses: Visualized paranasal sinuses are clear. No mastoid effusion. Orbits: Visualized globes and  orbits within normal limits. CTA HEAD Anterior circulation: The distal cervical segments patent bilaterally. Petrous segments widely patent. Scattered calcified atheromatous plaque within the cavernous/ supraclinoid ICAs with mild to moderate multi focal narrowing. Changes worse on the right. A1 segments patent. Left A1 segment is hypoplastic. Dominant right A2 segment which is widely patent to its distal aspect. Left A2 segment hypoplastic and small, with apparent occlusion (series 10, image 52). Atheromatous regularity throughout the anterior cerebral arteries. M1 segments patent without stenosis or occlusion. MCA bifurcations normal. Focal plaque at proximal M2 segment on the left with moderate stenosis (series 10, image 30). MCA branches opacified distally and symmetric. Atheromatous regularity throughout the MCA branches bilaterally. Posterior circulation: Attenuated flow within the vertebral arteries which are diminutive bilaterally. Left vertebral artery is dominant. Scattered atheromatous plaque within the V4 segments. There is fairly severe narrowing near the level of the vertebrobasilar junction (series 12, image 99). Basilar artery diminutive and attenuated distally with multi focal irregularity and moderate diffuse narrowing, but is patent to its distal aspect. Superior cerebral arteries patent bilaterally. There is fetal origin of the posterior cerebral arteries bilaterally, supplied via patent posterior communicating arteries. PCAs demonstrate multifocal atheromatous irregularity (worse on the left) but are supplied to their distal aspects. Venous sinuses: Grossly patent, although not well evaluated on this exam due to timing the contrast bolus. Anatomic variants: Fetal type PCAs with diminutive vertebrobasilar system. No aneurysm or vascular malformation. Delayed phase: No pathologic enhancement. IMPRESSION: 1. No acute intracranial process identified. 2. Bilateral fetal type PCAs with secondary  diminutive vertebrobasilar system. Attenuated flow throughout the diminutive vertebrobasilar system with superimposed multifocal atheromatous irregularity, with severe stenosis at the level of the vertebrobasilar junction. 3. Occlusion of the left A2 segment, of unknown chronicity. No CT findings to suggest evolving left ACA territory infarct, although evaluation with MRI would be warranted if there is concern for acute ischemia. 4. Additional age related atheromatous irregularity involving the anterior and posterior circulation as above. Electronically Signed   By: Jeannine Boga M.D.   On: 04/15/2016 01:31   Mr Angiogram Neck W Contrast  Result Date: 04/15/2016 CLINICAL DATA:  Stroke. Diabetes, hypertension, hyperlipidemia. Slurred speech EXAM: MR HEAD WITHOUT CONTRAST MR CIRCLE OF WILLIS WITHOUT CONTRAST MRA OF THE NECK WITHOUT AND WITH CONTRAST TECHNIQUE: Multiplanar, multiecho pulse sequences of the brain, circle of willis and surrounding structures were obtained without intravenous contrast. Angiographic images of the neck were obtained using MRA technique without and with intravenous contrast. CONTRAST:  51mL MULTIHANCE GADOBENATE DIMEGLUMINE 529 MG/ML IV SOLN COMPARISON:  CTA 04/15/2016 FINDINGS: MR HEAD FINDINGS  Brain: Negative for acute infarct. Chronic microvascular ischemic change in the white matter. Chronic infarcts in the thalamus bilaterally. Brainstem and cerebellum intact. Negative for hemorrhage or mass. Pituitary normal in size. Vascular: Abnormal signal in the distal right vertebral artery. Flow voids in the remainder of the circle of Willis within normal limits. Skull and upper cervical spine: No acute skeletal abnormality. Degenerative changes C1-C2. Sinuses/Orbits: Negative Other: None MR CIRCLE OF WILLIS FINDINGS Severe stenosis distal right vertebral artery. Moderate stenosis distal left vertebral artery. Basilar is patent. AICA patent. Diffuse basilar atherosclerotic disease  with mild stenosis. Diffuse posterior cerebral artery atherosclerotic disease with moderate stenosis bilaterally. Fetal origin of the posterior cerebral artery bilaterally. Superior cerebellar arteries patent bilaterally. Atherosclerotic irregularity in the cavernous carotid with mild stenosis bilaterally. Anterior and middle cerebral arteries patent bilaterally. Mild stenosis of the distal right M1 segment and right middle cerebral artery bifurcation. Left middle cerebral artery branches patent. Occlusion of the left anterior cerebral artery A2 segment. MRA NECK FINDINGS Severe stenosis at the origin of the vertebral artery bilaterally. Severe stenosis distal right vertebral artery. Moderate stenosis distal left vertebral artery. Carotid artery is patent bilaterally without significant stenosis. Mild atherosclerotic disease left carotid bulb. IMPRESSION: Negative for acute infarct. Chronic microvascular ischemic changes in the white matter and basal ganglia Severe stenosis at the origin of the vertebral artery bilaterally. Severe stenosis distal right vertebral artery and moderate stenosis distal left vertebral artery. Diffuse atherosclerotic disease in the basilar and posterior cerebral arteries bilaterally. Occlusion of the left anterior cerebral artery A2 segment. Mild atherosclerotic disease in the right middle cerebral artery. No significant carotid stenosis in the neck. Electronically Signed   By: Franchot Gallo M.D.   On: 04/15/2016 13:23   Mr Brain Wo Contrast  Result Date: 04/15/2016 CLINICAL DATA:  Stroke. Diabetes, hypertension, hyperlipidemia. Slurred speech EXAM: MR HEAD WITHOUT CONTRAST MR CIRCLE OF WILLIS WITHOUT CONTRAST MRA OF THE NECK WITHOUT AND WITH CONTRAST TECHNIQUE: Multiplanar, multiecho pulse sequences of the brain, circle of willis and surrounding structures were obtained without intravenous contrast. Angiographic images of the neck were obtained using MRA technique without and with  intravenous contrast. CONTRAST:  68mL MULTIHANCE GADOBENATE DIMEGLUMINE 529 MG/ML IV SOLN COMPARISON:  CTA 04/15/2016 FINDINGS: MR HEAD FINDINGS Brain: Negative for acute infarct. Chronic microvascular ischemic change in the white matter. Chronic infarcts in the thalamus bilaterally. Brainstem and cerebellum intact. Negative for hemorrhage or mass. Pituitary normal in size. Vascular: Abnormal signal in the distal right vertebral artery. Flow voids in the remainder of the circle of Willis within normal limits. Skull and upper cervical spine: No acute skeletal abnormality. Degenerative changes C1-C2. Sinuses/Orbits: Negative Other: None MR CIRCLE OF WILLIS FINDINGS Severe stenosis distal right vertebral artery. Moderate stenosis distal left vertebral artery. Basilar is patent. AICA patent. Diffuse basilar atherosclerotic disease with mild stenosis. Diffuse posterior cerebral artery atherosclerotic disease with moderate stenosis bilaterally. Fetal origin of the posterior cerebral artery bilaterally. Superior cerebellar arteries patent bilaterally. Atherosclerotic irregularity in the cavernous carotid with mild stenosis bilaterally. Anterior and middle cerebral arteries patent bilaterally. Mild stenosis of the distal right M1 segment and right middle cerebral artery bifurcation. Left middle cerebral artery branches patent. Occlusion of the left anterior cerebral artery A2 segment. MRA NECK FINDINGS Severe stenosis at the origin of the vertebral artery bilaterally. Severe stenosis distal right vertebral artery. Moderate stenosis distal left vertebral artery. Carotid artery is patent bilaterally without significant stenosis. Mild atherosclerotic disease left carotid bulb. IMPRESSION: Negative for acute infarct.  Chronic microvascular ischemic changes in the white matter and basal ganglia Severe stenosis at the origin of the vertebral artery bilaterally. Severe stenosis distal right vertebral artery and moderate stenosis  distal left vertebral artery. Diffuse atherosclerotic disease in the basilar and posterior cerebral arteries bilaterally. Occlusion of the left anterior cerebral artery A2 segment. Mild atherosclerotic disease in the right middle cerebral artery. No significant carotid stenosis in the neck. Electronically Signed   By: Franchot Gallo M.D.   On: 04/15/2016 13:23   Mr Jodene Nam Head/brain [id number] Cm  Result Date: 04/15/2016 CLINICAL DATA:  Stroke. Diabetes, hypertension, hyperlipidemia. Slurred speech EXAM: MR HEAD WITHOUT CONTRAST MR CIRCLE OF WILLIS WITHOUT CONTRAST MRA OF THE NECK WITHOUT AND WITH CONTRAST TECHNIQUE: Multiplanar, multiecho pulse sequences of the brain, circle of willis and surrounding structures were obtained without intravenous contrast. Angiographic images of the neck were obtained using MRA technique without and with intravenous contrast. CONTRAST:  5mL MULTIHANCE GADOBENATE DIMEGLUMINE 529 MG/ML IV SOLN COMPARISON:  CTA 04/15/2016 FINDINGS: MR HEAD FINDINGS Brain: Negative for acute infarct. Chronic microvascular ischemic change in the white matter. Chronic infarcts in the thalamus bilaterally. Brainstem and cerebellum intact. Negative for hemorrhage or mass. Pituitary normal in size. Vascular: Abnormal signal in the distal right vertebral artery. Flow voids in the remainder of the circle of Willis within normal limits. Skull and upper cervical spine: No acute skeletal abnormality. Degenerative changes C1-C2. Sinuses/Orbits: Negative Other: None MR CIRCLE OF WILLIS FINDINGS Severe stenosis distal right vertebral artery. Moderate stenosis distal left vertebral artery. Basilar is patent. AICA patent. Diffuse basilar atherosclerotic disease with mild stenosis. Diffuse posterior cerebral artery atherosclerotic disease with moderate stenosis bilaterally. Fetal origin of the posterior cerebral artery bilaterally. Superior cerebellar arteries patent bilaterally. Atherosclerotic irregularity in the cavernous  carotid with mild stenosis bilaterally. Anterior and middle cerebral arteries patent bilaterally. Mild stenosis of the distal right M1 segment and right middle cerebral artery bifurcation. Left middle cerebral artery branches patent. Occlusion of the left anterior cerebral artery A2 segment. MRA NECK FINDINGS Severe stenosis at the origin of the vertebral artery bilaterally. Severe stenosis distal right vertebral artery. Moderate stenosis distal left vertebral artery. Carotid artery is patent bilaterally without significant stenosis. Mild atherosclerotic disease left carotid bulb. IMPRESSION: Negative for acute infarct. Chronic microvascular ischemic changes in the white matter and basal ganglia Severe stenosis at the origin of the vertebral artery bilaterally. Severe stenosis distal right vertebral artery and moderate stenosis distal left vertebral artery. Diffuse atherosclerotic disease in the basilar and posterior cerebral arteries bilaterally. Occlusion of the left anterior cerebral artery A2 segment. Mild atherosclerotic disease in the right middle cerebral artery. No significant carotid stenosis in the neck. Electronically Signed   By: Franchot Gallo M.D.   On: 04/15/2016 13:23     Procedures    INITIAL IMPRESSION / ASSESSMENT AND PLAN / ED COURSE  Pertinent labs & imaging results that were available during my care of the patient were reviewed by me and considered in my medical decision making (see chart for details).     Clinical Course    ____________________________________________  FINAL CLINICAL IMPRESSION(S) / ED DIAGNOSES  Final diagnoses:  CVA (cerebral vascular accident) ([place])  CVA (cerebral vascular accident) ([place])  CVA (cerebral vascular accident) ([place])     MEDICATIONS GIVEN DURING THIS VISIT:  Medications  pantoprazole (PROTONIX) EC tablet 40 mg (40 mg Oral Given 04/15/16 0855)  simvastatin (ZOCOR) tablet 20 mg (20 mg Oral Given 04/15/16 1803)  enoxaparin  (LOVENOX) injection 40  mg (40 mg Subcutaneous Given 04/15/16 0507)  insulin aspart (novoLOG) injection 0-9 Units (1 Units Subcutaneous Given 04/15/16 2150)  aspirin EC tablet 81 mg (81 mg Oral Not Given 04/15/16 1217)  clopidogrel (PLAVIX) tablet 75 mg (75 mg Oral Given 04/15/16 1433)  acetaminophen (TYLENOL) tablet 650 mg (650 mg Oral Given 04/15/16 2146)  iopamidol (ISOVUE-370) 76 % injection 75 mL (75 mLs Intravenous Contrast Given 04/15/16 0017)   stroke: mapping our early stages of recovery book ( Does not apply Given 04/15/16 0507)  gadobenate dimeglumine (MULTIHANCE) injection 20 mL (20 mLs Intravenous Contrast Given 04/15/16 1257)     NEW OUTPATIENT MEDICATIONS STARTED DURING THIS VISIT:  Current Discharge Medication List      Current Discharge Medication List      Current Discharge Medication List       Note:  This document was prepared using Dragon voice recognition software and may include unintentional dictation errors.    Gregor Hams, MD 04/15/16 [id number]

## 2016-04-14 NOTE — ED Triage Notes (Signed)
Pt. States he was talking to nurse at [facility] today when he lost most vision in lt. Eye.  Pt. Denies any other symptoms at this time.

## 2016-04-15 ENCOUNTER — Emergency Department: Payer: PPO

## 2016-04-15 ENCOUNTER — Observation Stay
Admit: 2016-04-15 | Discharge: 2016-04-15 | Disposition: A | Discharge status: Home / Self Care | Payer: PPO | Attending: Internal Medicine | Admitting: Internal Medicine

## 2016-04-15 ENCOUNTER — Observation Stay: Payer: PPO

## 2016-04-15 DIAGNOSIS — I63533 Cerebral infarction due to unspecified occlusion or stenosis of bilateral posterior cerebral arteries: Secondary | ICD-10-CM | POA: Diagnosis not present

## 2016-04-15 DIAGNOSIS — I639 Cerebral infarction, unspecified: Secondary | ICD-10-CM | POA: Diagnosis present

## 2016-04-15 DIAGNOSIS — I10 Essential (primary) hypertension: Secondary | ICD-10-CM | POA: Diagnosis present

## 2016-04-15 DIAGNOSIS — E119 Type 2 diabetes mellitus without complications: Secondary | ICD-10-CM

## 2016-04-15 DIAGNOSIS — E785 Hyperlipidemia, unspecified: Secondary | ICD-10-CM | POA: Diagnosis present

## 2016-04-15 DIAGNOSIS — K219 Gastro-esophageal reflux disease without esophagitis: Secondary | ICD-10-CM | POA: Diagnosis present

## 2016-04-15 LAB — ECHOCARDIOGRAM COMPLETE
AOVTI: 67.2 cm
AV Mean grad: 17 mmHg
AV Peak grad: 29 mmHg
AV VEL mean LVOT/AV: 0.33
AV area mean vel ind: 0.59 cm2/m2
AV peak Index: 0.62
AV vel: 1.53
AVA: 1.53 cm2
AVAREAMEANV: 1.36 cm2
AVAREAVTI: 1.43 cm2
AVAREAVTIIND: 0.67 cm2/m2
AVPHT: 660 ms
AVPKVEL: 267 cm/s
Ao pk vel: 0.34 m/s
CHL CUP AV VALUE AREA INDEX: 0.67
CHL CUP MV DEC (S): 398
DOP CAL AO MEAN VELOCITY: 192 cm/s
EERAT: 7.41
EWDT: 398 ms
FS: 25 % — AB (ref 28–44)
Height: 71 in
IVS/LV PW RATIO, ED: 1.23
LA ID, A-P, ES: 39 mm
LA diam index: 1.7 cm/m2
LA vol A4C: 54.8 ml
LA vol index: 30.4 mL/m2
LAVOL: 69.5 mL
LEFT ATRIUM END SYS DIAM: 39 mm
LV E/e'average: 7.41
LV PW d: 10.2 mm — AB (ref 0.6–1.1)
LV TDI E'LATERAL: 8.38
LV TDI E'MEDIAL: 4.79
LV e' LATERAL: 8.38 cm/s
LVEEMED: 7.41
LVOT VTI: 24.7 cm
LVOT area: 4.15 cm2
LVOT peak VTI: 0.37 cm
LVOT peak vel: 92 cm/s
LVOTD: 23 mm
LVOTSV: 103 mL
MV pk A vel: 110 m/s
MVPKEVEL: 62.1 m/s
RV LATERAL S' VELOCITY: 9.57 cm/s
RV TAPSE: 24.4 mm
Weight: 3600 oz

## 2016-04-15 LAB — ETHANOL

## 2016-04-15 LAB — COMPREHENSIVE METABOLIC PANEL
ALBUMIN: 4 g/dL (ref 3.5–5.0)
ALK PHOS: 68 U/L (ref 38–126)
ALT: 17 U/L (ref 17–63)
ANION GAP: 7 (ref 5–15)
AST: 21 U/L (ref 15–41)
BILIRUBIN TOTAL: 0.6 mg/dL (ref 0.3–1.2)
BUN: 13 mg/dL (ref 6–20)
CALCIUM: 9.1 mg/dL (ref 8.9–10.3)
CO2: 28 mmol/L (ref 22–32)
Chloride: 108 mmol/L (ref 101–111)
Creatinine, Ser: 0.85 mg/dL (ref 0.61–1.24)
GLUCOSE: 102 mg/dL — AB (ref 65–99)
Potassium: 3.7 mmol/L (ref 3.5–5.1)
Sodium: 143 mmol/L (ref 135–145)
TOTAL PROTEIN: 7.5 g/dL (ref 6.5–8.1)

## 2016-04-15 LAB — GLUCOSE, CAPILLARY
GLUCOSE-CAPILLARY: 111 mg/dL — AB (ref 65–99)
GLUCOSE-CAPILLARY: 136 mg/dL — AB (ref 65–99)
Glucose-Capillary: 132 mg/dL — ABNORMAL HIGH (ref 65–99)
Glucose-Capillary: 143 mg/dL — ABNORMAL HIGH (ref 65–99)

## 2016-04-15 LAB — CBC
HCT: 46.7 % (ref 40.0–52.0)
HEMOGLOBIN: 16.3 g/dL (ref 13.0–18.0)
MCH: 32.8 pg (ref 26.0–34.0)
MCHC: 34.8 g/dL (ref 32.0–36.0)
MCV: 94.2 fL (ref 80.0–100.0)
Platelets: 158 10*3/uL (ref 150–440)
RBC: 4.96 MIL/uL (ref 4.40–5.90)
RDW: 14.3 % (ref 11.5–14.5)
WBC: 6.6 10*3/uL (ref 3.8–10.6)

## 2016-04-15 LAB — TROPONIN I

## 2016-04-15 LAB — LIPID PANEL
CHOL/HDL RATIO: 3 ratio
Cholesterol: 93 mg/dL (ref 0–200)
HDL: 31 mg/dL — AB (ref >40)
LDL Cholesterol: 47 mg/dL (ref 0–99)
TRIGLYCERIDES: 74 mg/dL (ref <150)
VLDL: 15 mg/dL (ref 0–40)

## 2016-04-15 LAB — PROTIME-INR
INR: 0.94
PROTHROMBIN TIME: 12.6 s (ref 11.4–15.2)

## 2016-04-15 LAB — APTT: aPTT: <24 seconds — ABNORMAL LOW (ref 24–36)

## 2016-04-15 LAB — TSH: TSH: 1.544 u[IU]/mL (ref 0.350–4.500)

## 2016-04-15 MED ORDER — PANTOPRAZOLE SODIUM 40 MG PO TBEC
40.0000 mg | DELAYED_RELEASE_TABLET | Freq: Every day | ORAL | Status: DC
  Administered 2016-04-15 – 2016-04-16 (×2): 40 mg via ORAL
  Filled 2016-04-15 (×2): qty 1

## 2016-04-15 MED ORDER — ACETAMINOPHEN 325 MG PO TABS
650.0000 mg | ORAL_TABLET | Freq: Four times a day (QID) | ORAL | Status: DC | PRN
  Administered 2016-04-15: 22:00:00 650 mg via ORAL
  Filled 2016-04-15: qty 2

## 2016-04-15 MED ORDER — ASPIRIN EC 325 MG PO TBEC
325.0000 mg | DELAYED_RELEASE_TABLET | Freq: Every day | ORAL | Status: DC
  Administered 2016-04-15: 325 mg via ORAL
  Filled 2016-04-15: qty 1

## 2016-04-15 MED ORDER — ASPIRIN EC 81 MG PO TBEC
81.0000 mg | DELAYED_RELEASE_TABLET | Freq: Every day | ORAL | Status: DC
  Administered 2016-04-16: 11:00:00 81 mg via ORAL
  Filled 2016-04-15: qty 1

## 2016-04-15 MED ORDER — IOPAMIDOL (ISOVUE-370) INJECTION 76%
75.0000 mL | Freq: Once | INTRAVENOUS | Status: AC | PRN
  Administered 2016-04-15: 75 mL via INTRAVENOUS

## 2016-04-15 MED ORDER — STROKE: EARLY STAGES OF RECOVERY BOOK
Freq: Once | Status: AC
  Administered 2016-04-15: 05:00:00

## 2016-04-15 MED ORDER — ENOXAPARIN SODIUM 40 MG/0.4ML ~~LOC~~ SOLN
40.0000 mg | SUBCUTANEOUS | Status: DC
  Administered 2016-04-15 – 2016-04-16 (×2): 40 mg via SUBCUTANEOUS
  Filled 2016-04-15 (×2): qty 0.4

## 2016-04-15 MED ORDER — INSULIN ASPART 100 UNIT/ML ~~LOC~~ SOLN
0.0000 [IU] | Freq: Four times a day (QID) | SUBCUTANEOUS | Status: DC
  Administered 2016-04-15 (×3): 1 [IU] via SUBCUTANEOUS
  Administered 2016-04-16: 2 [IU] via SUBCUTANEOUS
  Administered 2016-04-16: 1 [IU] via SUBCUTANEOUS
  Filled 2016-04-15: qty 1
  Filled 2016-04-15: qty 2
  Filled 2016-04-15 (×3): qty 1

## 2016-04-15 MED ORDER — GADOBENATE DIMEGLUMINE 529 MG/ML IV SOLN
20.0000 mL | Freq: Once | INTRAVENOUS | Status: AC | PRN
  Administered 2016-04-15: 20 mL via INTRAVENOUS

## 2016-04-15 MED ORDER — SIMVASTATIN 20 MG PO TABS
20.0000 mg | ORAL_TABLET | Freq: Every day | ORAL | Status: DC
  Administered 2016-04-15: 20 mg via ORAL
  Filled 2016-04-15: qty 1

## 2016-04-15 MED ORDER — CLOPIDOGREL BISULFATE 75 MG PO TABS
75.0000 mg | ORAL_TABLET | Freq: Every day | ORAL | Status: DC
  Administered 2016-04-15 – 2016-04-16 (×2): 75 mg via ORAL
  Filled 2016-04-15 (×2): qty 1

## 2016-04-15 NOTE — Evaluation (Signed)
Physical Therapy Evaluation Patient Details Name: Darrell Clark MRN: [id number] DOB: March 12, 1933 Today's Date: 04/15/2016   History of Present Illness  Patient is an 82 y/o male that presents with acute onset L homonymous hemianopsia and slurred speech (which has resolved).   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 Upon inspection, his visual field is roughly cut in half, he reports only shadows on his L side, does not respond when PT's hand comes at him from his L side. He denies any sensory changes, all MMT WNL aside from RUE shoulder flexion 4/5. Patient demonstrates mild balance deficits in sit to stand transfer and gait with RW. It appears he has been living at home alone and driving, his wife is currently at [facility] for rehab. Given his performance today, it is recommended he be discharged to SNF to allow for time to complete safety training with new onset visual field cut.     Follow Up Recommendations SNF    Equipment Recommendations  Rolling walker with 5" wheels    Recommendations for Other Services       Precautions / Restrictions Precautions Precautions: Fall Restrictions Weight Bearing Restrictions: No      Mobility  Bed Mobility Overal bed mobility: Needs Assistance Bed Mobility: Supine to Sit     Supine to sit: Min guard;Min assist     General bed mobility comments: Patient struggles to elevate torso off of the bed surface, requires PT assistance to complete.   Transfers Overall transfer level: Needs assistance Equipment used: Rolling walker (2 wheeled) Transfers: Sit to/from Stand Sit to Stand: Min assist;Mod assist         General transfer comment: Patient requires assistance to complete transfer safely due to balance deficits and mild loss of balance in standing transfer.   Ambulation/Gait Ambulation/Gait assistance: Min guard Ambulation Distance (Feet): 100 Feet Assistive device:  Rolling walker (2 wheeled) Gait Pattern/deviations: WFL(Within Functional Limits)   Gait velocity interpretation: Below normal speed for age/gender General Gait Details: Symmetrical gait pattern, though one stumble noted when WBing on LLE, he reports due to tripping on sock, though unclear if this was due to weakness. He reports he has chronic low back pain and asks to halt therapy after short bout of ambulation.   Stairs            Wheelchair Mobility    Modified Rankin (Stroke Patients Only)       Balance Overall balance assessment: Needs assistance Sitting-balance support: No upper extremity supported Sitting balance-Leahy Scale: Good     Standing balance support: Bilateral upper extremity supported Standing balance-Leahy Scale: Fair                               Pertinent Vitals/Pain Pain Assessment: No/denies pain    Home Living Family/patient expects to be discharged to:: Private residence Living Arrangements: Alone (Wife currently at [facility]) Available Help at Discharge:  (Unclear if any assistance available) Type of Home: House Home Access: Ramped entrance     Home Layout: One level Home Equipment: Cane - single point      Prior Function Level of Independence: Independent with assistive device(s)         Comments: patient reports he was using a SPC, but is a poor historian and unclear how accurate this is. He denies any falls recently.      Hand Dominance  Extremity/Trunk Assessment   Upper Extremity Assessment: RUE deficits/detail RUE Deficits / Details: R grip and elbow flexion WNL, shoulder flexion appeared to be 4/5 relative to LUE         Lower Extremity Assessment: Overall WFL for tasks assessed (Denies any sensory deficits on testing)         Communication   Communication: No difficulties  Cognition Arousal/Alertness: Awake/alert Behavior During Therapy: WFL for tasks assessed/performed Overall  Cognitive Status: Within Functional Limits for tasks assessed                      General Comments      Exercises Other Exercises Other Exercises: Sensory testing reported to be WNL, declined any differences in sensation from face down through feet.    Assessment/Plan    PT Assessment Patient needs continued PT services  PT Problem List Decreased strength;Decreased mobility;Decreased activity tolerance;Decreased balance;Decreased safety awareness;Other (comment) (L homonymous hemianopsia)          PT Treatment Interventions DME instruction;Therapeutic activities;Therapeutic exercise;Gait training;Balance training;Stair training;Neuromuscular re-education    PT Goals (Current goals can be found in the Care Plan section)  Acute Rehab PT Goals Patient Stated Goal: To return home  PT Goal Formulation: With patient Time For Goal Achievement: 04/29/16 Potential to Achieve Goals: Good    Frequency 7X/week   Barriers to discharge        Co-evaluation               End of Session Equipment Utilized During Treatment: Gait belt Activity Tolerance: Patient limited by pain (Chronic low back pain) Patient left: in chair;with chair alarm set;with call bell/phone within reach Nurse Communication: Mobility status    Functional Assessment Tool Used: Clinical judgement  Functional Limitation: Mobility: Walking and moving around Mobility: Walking and Moving Around Current Status [id number]): At least 20 percent but less than 40 percent impaired, limited or restricted Mobility: Walking and Moving Around Goal Status [phone]): At least 1 percent but less than 20 percent impaired, limited or restricted    Time: [id number] PT Time Calculation (min) (ACUTE ONLY): 17 min   Charges:   PT Evaluation $PT Eval Moderate Complexity: 1 Procedure     PT G Codes:   PT G-Codes **NOT FOR INPATIENT CLASS** Functional Assessment Tool Used: Clinical judgement  Functional Limitation:  Mobility: Walking and moving around Mobility: Walking and Moving Around Current Status [id number]): At least 20 percent but less than 40 percent impaired, limited or restricted Mobility: Walking and Moving Around Goal Status [phone]): At least 1 percent but less than 20 percent impaired, limited or restricted   Kerman Passey, PT, DPT    04/15/2016, 4:02 PM

## 2016-04-15 NOTE — ED Notes (Signed)
[facility] set up in room.

## 2016-04-15 NOTE — Progress Notes (Addendum)
[place] [facility] [place] NAME: Darrell Clark    MR#:  [id number]  DATE OF BIRTH:  26-May-1933  SUBJECTIVE:  CHIEF COMPLAINT:   Chief Complaint  Patient presents with  . Visual Field Change    Pt. states vision change in lt. eye   The patient is a 82 year old African-American male with medical history significant for history of coronary artery disease, diabetes mellitus, hyperlipidemia, hypertension, who presents to the hospital with slurred speech, which started in the morning of admission, followed by blurring of the vision in the left eye in the midial visual field. Neurologist feels that patient has left homonymous hemianopsia, recommended adding Plavix to aspirin therapy   Review of Systems  Constitutional: Negative for chills, fever and weight loss.  HENT: Negative for congestion.   Eyes: Positive for blurred vision. Negative for double vision.  Respiratory: Negative for cough, sputum production, shortness of breath and wheezing.   Cardiovascular: Negative for chest pain, palpitations, orthopnea, leg swelling and PND.  Gastrointestinal: Negative for abdominal pain, blood in stool, constipation, diarrhea, nausea and vomiting.  Genitourinary: Negative for dysuria, frequency, hematuria and urgency.  Musculoskeletal: Negative for falls.  Neurological: Negative for dizziness, tremors, focal weakness and headaches.  Endo/Heme/Allergies: Does not bruise/bleed easily.  Psychiatric/Behavioral: Negative for depression. The patient does not have insomnia.     VITAL SIGNS: Blood pressure (!) 145/71, pulse 66, temperature 98 F (36.7 C), temperature source Oral, resp. rate 19, height 5\' 11"  (1.803 m), weight 102.1 kg (225 lb), SpO2 100 %.  PHYSICAL EXAMINATION:   GENERAL:  82 y.o.-year-old patient lying in the bed with no acute distress. No significant slurring of the speech.  EYES: Pupils equal, round, reactive to light and accommodation.  No scleral icterus. Extraocular muscles intact.  HEENT: Head atraumatic, normocephalic. Oropharynx and nasopharynx clear.  NECK:  Supple, no jugular venous distention. No thyroid enlargement, no tenderness.  LUNGS: Normal breath sounds bilaterally, no wheezing, rales,rhonchi or crepitation. No use of accessory muscles of respiration.  CARDIOVASCULAR: S1, S2 normal. No murmurs, rubs, or gallops.  ABDOMEN: Soft, nontender, nondistended. Bowel sounds present. No organomegaly or mass.  EXTREMITIES: No pedal edema, cyanosis, or clubbing.  NEUROLOGIC: Cranial nerves II through XII are intact. Muscle strength 5/5 in all extremities. Sensation intact. Gait not checked. Patient has decreased vision in left eye, medial visual field, peripheral vision is intact, intact vision in the right eye PSYCHIATRIC: The patient is alert and oriented x 3.  SKIN: No obvious rash, lesion, or ulcer.   ORDERS/RESULTS REVIEWED:   CBC  Recent Labs Lab 04/15/16 0052  WBC 6.6  HGB 16.3  HCT 46.7  PLT 158  MCV 94.2  MCH 32.8  MCHC 34.8  RDW 14.3   ------------------------------------------------------------------------------------------------------------------  Chemistries   Recent Labs Lab 04/15/16 0007  NA 143  K 3.7  CL 108  CO2 28  GLUCOSE 102*  BUN 13  CREATININE 0.85  CALCIUM 9.1  AST 21  ALT 17  ALKPHOS 68  BILITOT 0.6   ------------------------------------------------------------------------------------------------------------------ estimated creatinine clearance is 82.9 mL/min (by C-G formula based on SCr of 0.85 mg/dL). ------------------------------------------------------------------------------------------------------------------ No results for input(s): TSH, T4TOTAL, T3FREE, THYROIDAB in the last 72 hours.  Invalid input(s): FREET3  Cardiac Enzymes  Recent Labs Lab 04/15/16 0007  TROPONINI <0.03    ------------------------------------------------------------------------------------------------------------------ Invalid input(s): POCBNP ---------------------------------------------------------------------------------------------------------------  RADIOLOGY: Ct Angio Head W Or Wo Contrast  Result Date: 04/15/2016 CLINICAL DATA:  Initial valuation for dizziness, slurred speech. Left  visual changes. EXAM: CT ANGIOGRAPHY HEAD TECHNIQUE: Multidetector CT imaging of the head was performed using the standard protocol during bolus administration of intravenous contrast. Multiplanar CT image reconstructions and MIPs were obtained to evaluate the vascular anatomy. CONTRAST:  75 cc of Isovue view 370. COMPARISON:  Prior CT from 08/15/2006. FINDINGS: CT HEAD Brain: Cerebral volume normal for age. Minimal chronic microvascular ischemic disease, also felt to be within normal limits for age. Remote lacunar infarct within the right thalamus noted. Negative for acute intracranial hemorrhage. No acute infarct identified. No mass lesion, midline shift or mass effect. No hydrocephalus. No extra-axial fluid collection. Vascular: Prominent vascular calcifications are within the carotid siphons. Skull: Scalp soft tissues demonstrate no acute abnormality. Sinuses: Visualized paranasal sinuses are clear. No mastoid effusion. Orbits: Visualized globes and orbits within normal limits. CTA HEAD Anterior circulation: The distal cervical segments patent bilaterally. Petrous segments widely patent. Scattered calcified atheromatous plaque within the cavernous/ supraclinoid ICAs with mild to moderate multi focal narrowing. Changes worse on the right. A1 segments patent. Left A1 segment is hypoplastic. Dominant right A2 segment which is widely patent to its distal aspect. Left A2 segment hypoplastic and small, with apparent occlusion (series 10, image 52). Atheromatous regularity throughout the anterior cerebral arteries. M1  segments patent without stenosis or occlusion. MCA bifurcations normal. Focal plaque at proximal M2 segment on the left with moderate stenosis (series 10, image 30). MCA branches opacified distally and symmetric. Atheromatous regularity throughout the MCA branches bilaterally. Posterior circulation: Attenuated flow within the vertebral arteries which are diminutive bilaterally. Left vertebral artery is dominant. Scattered atheromatous plaque within the V4 segments. There is fairly severe narrowing near the level of the vertebrobasilar junction (series 12, image 99). Basilar artery diminutive and attenuated distally with multi focal irregularity and moderate diffuse narrowing, but is patent to its distal aspect. Superior cerebral arteries patent bilaterally. There is fetal origin of the posterior cerebral arteries bilaterally, supplied via patent posterior communicating arteries. PCAs demonstrate multifocal atheromatous irregularity (worse on the left) but are supplied to their distal aspects. Venous sinuses: Grossly patent, although not well evaluated on this exam due to timing the contrast bolus. Anatomic variants: Fetal type PCAs with diminutive vertebrobasilar system. No aneurysm or vascular malformation. Delayed phase: No pathologic enhancement. IMPRESSION: 1. No acute intracranial process identified. 2. 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 Attenuated flow throughout the diminutive vertebrobasilar system with superimposed multifocal atheromatous irregularity, with severe stenosis at the level of the vertebrobasilar junction. 3. Occlusion of the left A2 segment, of unknown chronicity. No CT findings to suggest evolving left ACA territory infarct, although evaluation with MRI would be warranted if there is concern for acute ischemia. 4. Additional age related atheromatous irregularity involving the anterior and posterior circulation as above. Electronically Signed   By:  Jeannine Boga M.D.   On: 04/15/2016 01:31    EKG:  Orders placed or performed during the hospital encounter of 04/14/16  . EKG 12-Lead  . EKG 12-Lead  . ED EKG  . ED EKG    ASSESSMENT AND PLAN:  Principal Problem:   CVA (cerebral vascular accident) ([place]) Active Problems:   HTN (hypertension)   Diabetes ([place])   GERD (gastroesophageal reflux disease)   HLD (hyperlipidemia)  #1. Stroke with left homonymous hemianopsia, likely occipital stroke, likely  due to acute on chronic vertebrobasilar blood flow abnormality. Continue patient on aspirin, Zocor, neurology consultation is appreciated, Plavix was added, MRI of the brain is pending, echocardiogram is unremarkable.  Getting hemoglobin A1c and TSH #2. Essential hypertension, holding blood pressure medications, reinitiate if blood pressure is significantly elevated #3. Hyperlipidemia, LDL is 47, continue Zocor #4. Diabetes mellitus, get hemoglobin A1c, continue patient on diabetic diet, sliding scale insulin while in the hospital  Management plans discussed with the patient, family and they are in agreement.   DRUG ALLERGIES:  Allergies  Allergen Reactions  . Niacin Itching  . Niacin And Related Rash    CODE STATUS:     Code Status Orders        Start     Ordered   04/15/16 0411  Full code  Continuous     04/15/16 0410    Code Status History    Date Active Date Inactive Code Status Order ID Comments User Context   This patient has a current code status but no historical code status.    Advance Directive Documentation   Flowsheet Row Most Recent Value  Type of Advance Directive  Healthcare Power of Attorney  Pre-existing out of facility DNR order (yellow form or pink MOST form)  No data  "MOST" Form in Place?  No data      TOTAL TIME TAKING CARE OF THIS PATIENT: 40 minutes.    Theodoro Grist M.D on 04/15/2016 at 12:05 PM  Between 7am to 6pm - Pager - [phone]  After 6pm go to [email or web address] -  password [email or web address] [facility]  Office  [phone]  CC: Primary care physician; Marden Noble, MD

## 2016-04-15 NOTE — Consult Note (Signed)
Subjective: Yesterday had some slurred speech and loss of most of the vision in the left eye, particularly to the right side. No pain. No similar episodes.  Objective: BcVA  J3 OD  HM OS   Pupils: APD OS  IOP: 21/20   VFTFC: Full OD; bare vision left field OS, dense VF loss to the right. OS.   Motility full. Anterior seg significant only for Cataract OU Dilated exam:    NL OD                              Cherry red spot/ diffuse retinal ischemia OS       Vital signs in last 24 hours: Temp:  [97.5 F (36.4 C)-98.3 F (36.8 C)] 98 F (36.7 C) (10/29 1117) Pulse Rate:  [57-72] 72 (10/29 1452) Resp:  [16-20] 19 (10/29 1117) BP: (105-172)/(60-114) 149/82 (10/29 1452) SpO2:  [91 %-100 %] 100 % (10/29 1452) Weight:  [102.1 kg (225 lb)] 102.1 kg (225 lb) (10/28 2337)    BcVA  J3 OD  HM OS   Pupils: APD OS  IOP: 21/20   VFTFC: Full OD; bare vision left field OS, dense VF loss to the right. OS.   Motility full. Anterior seg significant only for Cataract OU Dilated exam:    NL OD                              Cherry red spot/ diffuse retinal ischemia OS     Recent Labs  04/15/16 0007 04/15/16 0052  WBC  --  6.6  HGB  --  16.3  HCT  --  46.7  NA 143  --   K 3.7  --   CL 108  --   CO2 28  --   BUN 13  --   CREATININE 0.85  --     Studies/Results: Ct Angio Head W Or Wo Contrast  Result Date: 04/15/2016 CLINICAL DATA:  Initial valuation for dizziness, slurred speech. Left visual changes. EXAM: CT ANGIOGRAPHY HEAD TECHNIQUE: Multidetector CT imaging of the head was performed using the standard protocol during bolus administration of intravenous contrast. Multiplanar CT image reconstructions and MIPs were obtained to evaluate the vascular anatomy. CONTRAST:  75 cc of Isovue view 370. COMPARISON:  Prior CT from 08/15/2006. FINDINGS: CT HEAD Brain: Cerebral volume normal for age. Minimal chronic microvascular ischemic disease, also felt to be within normal limits for age. Remote  lacunar infarct within the right thalamus noted. Negative for acute intracranial hemorrhage. No acute infarct identified. No mass lesion, midline shift or mass effect. No hydrocephalus. No extra-axial fluid collection. Vascular: Prominent vascular calcifications are within the carotid siphons. Skull: Scalp soft tissues demonstrate no acute abnormality. Sinuses: Visualized paranasal sinuses are clear. No mastoid effusion. Orbits: Visualized globes and orbits within normal limits. CTA HEAD Anterior circulation: The distal cervical segments patent bilaterally. Petrous segments widely patent. Scattered calcified atheromatous plaque within the cavernous/ supraclinoid ICAs with mild to moderate multi focal narrowing. Changes worse on the right. A1 segments patent. Left A1 segment is hypoplastic. Dominant right A2 segment which is widely patent to its distal aspect. Left A2 segment hypoplastic and small, with apparent occlusion (series 10, image 52). Atheromatous regularity throughout the anterior cerebral arteries. M1 segments patent without stenosis or occlusion. MCA bifurcations normal. Focal plaque at proximal M2 segment on the left with moderate stenosis (series 10,  image 30). MCA branches opacified distally and symmetric. Atheromatous regularity throughout the MCA branches bilaterally. Posterior circulation: Attenuated flow within the vertebral arteries which are diminutive bilaterally. Left vertebral artery is dominant. Scattered atheromatous plaque within the V4 segments. There is fairly severe narrowing near the level of the vertebrobasilar junction (series 12, image 99). Basilar artery diminutive and attenuated distally with multi focal irregularity and moderate diffuse narrowing, but is patent to its distal aspect. Superior cerebral arteries patent bilaterally. There is fetal origin of the posterior cerebral arteries bilaterally, supplied via patent posterior communicating arteries. PCAs demonstrate multifocal  atheromatous irregularity (worse on the left) but are supplied to their distal aspects. Venous sinuses: Grossly patent, although not well evaluated on this exam due to timing the contrast bolus. Anatomic variants: Fetal type PCAs with diminutive vertebrobasilar system. No aneurysm or vascular malformation. Delayed phase: No pathologic enhancement. IMPRESSION: 1. No acute intracranial process identified. 2. 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 Attenuated flow throughout the diminutive vertebrobasilar system with superimposed multifocal atheromatous irregularity, with severe stenosis at the level of the vertebrobasilar junction. 3. Occlusion of the left A2 segment, of unknown chronicity. No CT findings to suggest evolving left ACA territory infarct, although evaluation with MRI would be warranted if there is concern for acute ischemia. 4. Additional age related atheromatous irregularity involving the anterior and posterior circulation as above. Electronically Signed   By: Jeannine Boga M.D.   On: 04/15/2016 01:31   Mr Angiogram Neck W Contrast  Result Date: 04/15/2016 CLINICAL DATA:  Stroke. Diabetes, hypertension, hyperlipidemia. Slurred speech EXAM: MR HEAD WITHOUT CONTRAST MR CIRCLE OF WILLIS WITHOUT CONTRAST MRA OF THE NECK WITHOUT AND WITH CONTRAST TECHNIQUE: Multiplanar, multiecho pulse sequences of the brain, circle of willis and surrounding structures were obtained without intravenous contrast. Angiographic images of the neck were obtained using MRA technique without and with intravenous contrast. CONTRAST:  57mL MULTIHANCE GADOBENATE DIMEGLUMINE 529 MG/ML IV SOLN COMPARISON:  CTA 04/15/2016 FINDINGS: MR HEAD FINDINGS Brain: Negative for acute infarct. Chronic microvascular ischemic change in the white matter. Chronic infarcts in the thalamus bilaterally. Brainstem and cerebellum intact. Negative for hemorrhage or mass. Pituitary normal in size. Vascular:  Abnormal signal in the distal right vertebral artery. Flow voids in the remainder of the circle of Willis within normal limits. Skull and upper cervical spine: No acute skeletal abnormality. Degenerative changes C1-C2. Sinuses/Orbits: Negative Other: None MR CIRCLE OF WILLIS FINDINGS Severe stenosis distal right vertebral artery. Moderate stenosis distal left vertebral artery. Basilar is patent. AICA patent. Diffuse basilar atherosclerotic disease with mild stenosis. Diffuse posterior cerebral artery atherosclerotic disease with moderate stenosis bilaterally. Fetal origin of the posterior cerebral artery bilaterally. Superior cerebellar arteries patent bilaterally. Atherosclerotic irregularity in the cavernous carotid with mild stenosis bilaterally. Anterior and middle cerebral arteries patent bilaterally. Mild stenosis of the distal right M1 segment and right middle cerebral artery bifurcation. Left middle cerebral artery branches patent. Occlusion of the left anterior cerebral artery A2 segment. MRA NECK FINDINGS Severe stenosis at the origin of the vertebral artery bilaterally. Severe stenosis distal right vertebral artery. Moderate stenosis distal left vertebral artery. Carotid artery is patent bilaterally without significant stenosis. Mild atherosclerotic disease left carotid bulb. IMPRESSION: Negative for acute infarct. Chronic microvascular ischemic changes in the white matter and basal ganglia Severe stenosis at the origin of the vertebral artery bilaterally. Severe stenosis distal right vertebral artery and moderate stenosis distal left vertebral artery. Diffuse atherosclerotic disease in the basilar and posterior cerebral arteries bilaterally.  Occlusion of the left anterior cerebral artery A2 segment. Mild atherosclerotic disease in the right middle cerebral artery. No significant carotid stenosis in the neck. Electronically Signed   By: Franchot Gallo M.D.   On: 04/15/2016 13:23   Mr Brain Wo  Contrast  Result Date: 04/15/2016 CLINICAL DATA:  Stroke. Diabetes, hypertension, hyperlipidemia. Slurred speech EXAM: MR HEAD WITHOUT CONTRAST MR CIRCLE OF WILLIS WITHOUT CONTRAST MRA OF THE NECK WITHOUT AND WITH CONTRAST TECHNIQUE: Multiplanar, multiecho pulse sequences of the brain, circle of willis and surrounding structures were obtained without intravenous contrast. Angiographic images of the neck were obtained using MRA technique without and with intravenous contrast. CONTRAST:  1mL MULTIHANCE GADOBENATE DIMEGLUMINE 529 MG/ML IV SOLN COMPARISON:  CTA 04/15/2016 FINDINGS: MR HEAD FINDINGS Brain: Negative for acute infarct. Chronic microvascular ischemic change in the white matter. Chronic infarcts in the thalamus bilaterally. Brainstem and cerebellum intact. Negative for hemorrhage or mass. Pituitary normal in size. Vascular: Abnormal signal in the distal right vertebral artery. Flow voids in the remainder of the circle of Willis within normal limits. Skull and upper cervical spine: No acute skeletal abnormality. Degenerative changes C1-C2. Sinuses/Orbits: Negative Other: None MR CIRCLE OF WILLIS FINDINGS Severe stenosis distal right vertebral artery. Moderate stenosis distal left vertebral artery. Basilar is patent. AICA patent. Diffuse basilar atherosclerotic disease with mild stenosis. Diffuse posterior cerebral artery atherosclerotic disease with moderate stenosis bilaterally. Fetal origin of the posterior cerebral artery bilaterally. Superior cerebellar arteries patent bilaterally. Atherosclerotic irregularity in the cavernous carotid with mild stenosis bilaterally. Anterior and middle cerebral arteries patent bilaterally. Mild stenosis of the distal right M1 segment and right middle cerebral artery bifurcation. Left middle cerebral artery branches patent. Occlusion of the left anterior cerebral artery A2 segment. MRA NECK FINDINGS Severe stenosis at the origin of the vertebral artery bilaterally.  Severe stenosis distal right vertebral artery. Moderate stenosis distal left vertebral artery. Carotid artery is patent bilaterally without significant stenosis. Mild atherosclerotic disease left carotid bulb. IMPRESSION: Negative for acute infarct. Chronic microvascular ischemic changes in the white matter and basal ganglia Severe stenosis at the origin of the vertebral artery bilaterally. Severe stenosis distal right vertebral artery and moderate stenosis distal left vertebral artery. Diffuse atherosclerotic disease in the basilar and posterior cerebral arteries bilaterally. Occlusion of the left anterior cerebral artery A2 segment. Mild atherosclerotic disease in the right middle cerebral artery. No significant carotid stenosis in the neck. Electronically Signed   By: Franchot Gallo M.D.   On: 04/15/2016 13:23   Mr Jodene Nam Head/brain [id number] Cm  Result Date: 04/15/2016 CLINICAL DATA:  Stroke. Diabetes, hypertension, hyperlipidemia. Slurred speech EXAM: MR HEAD WITHOUT CONTRAST MR CIRCLE OF WILLIS WITHOUT CONTRAST MRA OF THE NECK WITHOUT AND WITH CONTRAST TECHNIQUE: Multiplanar, multiecho pulse sequences of the brain, circle of willis and surrounding structures were obtained without intravenous contrast. Angiographic images of the neck were obtained using MRA technique without and with intravenous contrast. CONTRAST:  46mL MULTIHANCE GADOBENATE DIMEGLUMINE 529 MG/ML IV SOLN COMPARISON:  CTA 04/15/2016 FINDINGS: MR HEAD FINDINGS Brain: Negative for acute infarct. Chronic microvascular ischemic change in the white matter. Chronic infarcts in the thalamus bilaterally. Brainstem and cerebellum intact. Negative for hemorrhage or mass. Pituitary normal in size. Vascular: Abnormal signal in the distal right vertebral artery. Flow voids in the remainder of the circle of Willis within normal limits. Skull and upper cervical spine: No acute skeletal abnormality. Degenerative changes C1-C2. Sinuses/Orbits: Negative Other: None  MR CIRCLE OF WILLIS FINDINGS Severe stenosis distal right vertebral artery.  Moderate stenosis distal left vertebral artery. Basilar is patent. AICA patent. Diffuse basilar atherosclerotic disease with mild stenosis. Diffuse posterior cerebral artery atherosclerotic disease with moderate stenosis bilaterally. Fetal origin of the posterior cerebral artery bilaterally. Superior cerebellar arteries patent bilaterally. Atherosclerotic irregularity in the cavernous carotid with mild stenosis bilaterally. Anterior and middle cerebral arteries patent bilaterally. Mild stenosis of the distal right M1 segment and right middle cerebral artery bifurcation. Left middle cerebral artery branches patent. Occlusion of the left anterior cerebral artery A2 segment. MRA NECK FINDINGS Severe stenosis at the origin of the vertebral artery bilaterally. Severe stenosis distal right vertebral artery. Moderate stenosis distal left vertebral artery. Carotid artery is patent bilaterally without significant stenosis. Mild atherosclerotic disease left carotid bulb. IMPRESSION: Negative for acute infarct. Chronic microvascular ischemic changes in the white matter and basal ganglia Severe stenosis at the origin of the vertebral artery bilaterally. Severe stenosis distal right vertebral artery and moderate stenosis distal left vertebral artery. Diffuse atherosclerotic disease in the basilar and posterior cerebral arteries bilaterally. Occlusion of the left anterior cerebral artery A2 segment. Mild atherosclerotic disease in the right middle cerebral artery. No significant carotid stenosis in the neck. Electronically Signed   By: Franchot Gallo M.D.   On: 04/15/2016 13:23    Medications:  Assessment/Plan:    CRAO     OS Discussed with pt. And family present. Manage CVA risk factors. Control DM and HTN. Follow up [facility] as out patient 2 weeks.  30-2 visual field then.  LOS: 0 days   Kacie Huxtable LOUIS 10/29/20173:50  PM

## 2016-04-15 NOTE — ED Notes (Signed)
Transporting patient to 1C-110

## 2016-04-15 NOTE — Care Management Note (Signed)
Case Management Note  Patient Details  Name: Darrell Clark MRN: [id number] Date of Birth: May 27, 1933  Subjective/Objective:      Per Dr Ether Griffins Mr Woodring has had a CVA and she is requesting a change from Observation Status to Inpatient.               Action/Plan:   Expected Discharge Date:  04/17/16               Expected Discharge Plan:     In-House Referral:     Discharge planning Services     Post Acute Care Choice:    Choice offered to:     DME Arranged:    DME Agency:     HH Arranged:    HH Agency:     Status of Service:     If discussed at [employer], dates discussed:    Additional Comments:  Anders Hohmann A, RN 04/15/2016, 1:07 PM

## 2016-04-15 NOTE — Consult Note (Signed)
Referring Physician: Dr. Judeen Hammans     Chief Complaint: slurred speech and L sided blurry vision   HPI: Darrell Clark is an 82 y.o. male who presents with an episode of slurred speech yesterday, followed by blurring of vision in the left side in the evening. Patient states that his speech issue resolved and he didn't think much of his time, but his vision problem was persistent and so he came to the ED for evaluation. Currently appears to have [place] (L homonomous hemianopsia.     Date last known well: Date: 04/14/2016 Time last known well: Unable to determine tPA Given: No: out of TPA window  Past Medical History:  Diagnosis Date  . Allergy   . Anxiety   . Arthritis   . CAD (coronary artery disease)   . Diabetes mellitus without complication ([place])   . GERD (gastroesophageal reflux disease)   . HLD (hyperlipidemia)   . Hypertension   . Prostate cancer [facility])     Past Surgical History:  Procedure Laterality Date  . APPENDECTOMY      Family History  Problem Relation Age of Onset  . Heart attack Mother   . Hypertension Mother   . Heart attack Father   . Hypertension Father   . Hypertension Sister   . Cancer Brother   . Hypertension Brother    Social History:  reports that he has quit smoking. He has never used smokeless tobacco. He reports that he does not drink alcohol or use drugs.  Allergies:  Allergies  Allergen Reactions  . Niacin Itching  . Niacin And Related Rash    Medications: I have reviewed the patient's current medications.  ROS: History obtained from the patient  General ROS: negative for - chills, fatigue, fever, night sweats, weight gain or weight loss Psychological ROS: negative for - behavioral disorder, hallucinations, memory difficulties, mood swings or suicidal ideation Ophthalmic ROS: negative for - blurry vision, double vision, eye pain or loss of vision ENT ROS: negative for - epistaxis, nasal discharge, oral lesions, sore throat, tinnitus or  vertigo Allergy and Immunology ROS: negative for - hives or itchy/watery eyes Hematological and Lymphatic ROS: negative for - bleeding problems, bruising or swollen lymph nodes Endocrine ROS: negative for - galactorrhea, hair pattern changes, polydipsia/polyuria or temperature intolerance Respiratory ROS: negative for - cough, hemoptysis, shortness of breath or wheezing Cardiovascular ROS: negative for - chest pain, dyspnea on exertion, edema or irregular heartbeat Gastrointestinal ROS: negative for - abdominal pain, diarrhea, hematemesis, nausea/vomiting or stool incontinence Genito-Urinary ROS: negative for - dysuria, hematuria, incontinence or urinary frequency/urgency Musculoskeletal ROS: negative for - joint swelling or muscular weakness Neurological ROS: as noted in HPI Dermatological ROS: negative for rash and skin lesion changes  Physical Examination: Blood pressure (!) 145/71, pulse 66, temperature 98 F (36.7 C), temperature source Oral, resp. rate 19, height 5\' 11"  (1.803 m), weight 102.1 kg (225 lb), SpO2 100 %.  HEENT-  Normocephalic, no lesions, without obvious abnormality.  Normal external eye and conjunctiva.  Normal TM's bilaterally.  Normal auditory canals and external ears. Normal external nose, mucus membranes and septum.  Normal pharynx. Cardiovascular- regular rate and rhythm, S1, S2 normal, no murmur, click, rub or gallop, pulses palpable throughout   Lungs- Heart exam - S1, S2 normal, no murmur, no gallop, rate regular Abdomen- soft, non-tender; bowel sounds normal; no masses,  no organomegaly Extremities- less then 2 second capillary refill Lymph-no adenopathy palpable Musculoskeletal-no joint tenderness, deformity or swelling Skin-warm and dry, no  hyperpigmentation, vitiligo, or suspicious lesions  Neurological Examination Mental Status: Alert, oriented, thought content appropriate.  Speech fluent without evidence of aphasia.  Able to follow 3 step commands  without difficulty. Cranial Nerves: II: Discs flat bilaterally; suspected LHH, but exam changes with repeated testing  III,IV, VI: ptosis not present, extra-ocular motions intact bilaterally V,VII: smile symmetric, facial light touch sensation normal bilaterally VIII: hearing normal bilaterally IX,X: gag reflex present XI: bilateral shoulder shrug XII: midline tongue extension Motor: Right : Upper extremity   5/5    Left:     Upper extremity   5/5  Lower extremity   5/5     Lower extremity   5/5 Tone and bulk:normal tone throughout; no atrophy noted Sensory: Pinprick and light touch intact throughout, bilaterally Deep Tendon Reflexes: 2+ and symmetric throughout Plantars: Right: downgoing   Left: downgoing Cerebellar: normal finger-to-nose, normal rapid alternating movements and normal heel-to-shin test Gait: not tested       Laboratory Studies:  Basic Metabolic Panel:  Recent Labs Lab 04/15/16 0007  NA 143  K 3.7  CL 108  CO2 28  GLUCOSE 102*  BUN 13  CREATININE 0.85  CALCIUM 9.1    Liver Function Tests:  Recent Labs Lab 04/15/16 0007  AST 21  ALT 17  ALKPHOS 68  BILITOT 0.6  PROT 7.5  ALBUMIN 4.0   No results for input(s): LIPASE, AMYLASE in the last 168 hours. No results for input(s): AMMONIA in the last 168 hours.  CBC:  Recent Labs Lab 04/15/16 0052  WBC 6.6  HGB 16.3  HCT 46.7  MCV 94.2  PLT 158    Cardiac Enzymes:  Recent Labs Lab 04/15/16 0007  TROPONINI <0.03    BNP: Invalid input(s): POCBNP  CBG:  Recent Labs Lab 04/15/16 0436 04/15/16 1115  GLUCAP 111* 132*    Microbiology: No results found for this or any previous visit.  Coagulation Studies:  Recent Labs  04/15/16 0007  LABPROT 12.6  INR 0.94    Urinalysis: No results for input(s): COLORURINE, LABSPEC, PHURINE, GLUCOSEU, HGBUR, BILIRUBINUR, KETONESUR, PROTEINUR, UROBILINOGEN, NITRITE, LEUKOCYTESUR in the last 168 hours.  Invalid input(s):  APPERANCEUR  Lipid Panel:    Component Value Date/Time   CHOL 93 04/15/2016 0455   TRIG 74 04/15/2016 0455   HDL 31 (L) 04/15/2016 0455   CHOLHDL 3.0 04/15/2016 0455   VLDL 15 04/15/2016 0455   LDLCALC 47 04/15/2016 0455    HgbA1C: No results found for: HGBA1C  Urine Drug Screen:  No results found for: LABOPIA, COCAINSCRNUR, LABBENZ, AMPHETMU, THCU, LABBARB  Alcohol Level:  Recent Labs Lab 04/15/16 0007  ETH <5    Other results: EKG: normal EKG, normal sinus rhythm, unchanged from previous tracings.  Imaging: Ct Angio Head W Or Wo Contrast  Result Date: 04/15/2016 CLINICAL DATA:  Initial valuation for dizziness, slurred speech. Left visual changes. EXAM: CT ANGIOGRAPHY HEAD TECHNIQUE: Multidetector CT imaging of the head was performed using the standard protocol during bolus administration of intravenous contrast. Multiplanar CT image reconstructions and MIPs were obtained to evaluate the vascular anatomy. CONTRAST:  75 cc of Isovue view 370. COMPARISON:  Prior CT from 08/15/2006. FINDINGS: CT HEAD Brain: Cerebral volume normal for age. Minimal chronic microvascular ischemic disease, also felt to be within normal limits for age. Remote lacunar infarct within the right thalamus noted. Negative for acute intracranial hemorrhage. No acute infarct identified. No mass lesion, midline shift or mass effect. No hydrocephalus. No extra-axial fluid collection. Vascular: Prominent vascular  calcifications are within the carotid siphons. Skull: Scalp soft tissues demonstrate no acute abnormality. Sinuses: Visualized paranasal sinuses are clear. No mastoid effusion. Orbits: Visualized globes and orbits within normal limits. CTA HEAD Anterior circulation: The distal cervical segments patent bilaterally. Petrous segments widely patent. Scattered calcified atheromatous plaque within the cavernous/ supraclinoid ICAs with mild to moderate multi focal narrowing. Changes worse on the right. A1 segments  patent. Left A1 segment is hypoplastic. Dominant right A2 segment which is widely patent to its distal aspect. Left A2 segment hypoplastic and small, with apparent occlusion (series 10, image 52). Atheromatous regularity throughout the anterior cerebral arteries. M1 segments patent without stenosis or occlusion. MCA bifurcations normal. Focal plaque at proximal M2 segment on the left with moderate stenosis (series 10, image 30). MCA branches opacified distally and symmetric. Atheromatous regularity throughout the MCA branches bilaterally. Posterior circulation: Attenuated flow within the vertebral arteries which are diminutive bilaterally. Left vertebral artery is dominant. Scattered atheromatous plaque within the V4 segments. There is fairly severe narrowing near the level of the vertebrobasilar junction (series 12, image 99). Basilar artery diminutive and attenuated distally with multi focal irregularity and moderate diffuse narrowing, but is patent to its distal aspect. Superior cerebral arteries patent bilaterally. There is fetal origin of the posterior cerebral arteries bilaterally, supplied via patent posterior communicating arteries. PCAs demonstrate multifocal atheromatous irregularity (worse on the left) but are supplied to their distal aspects. Venous sinuses: Grossly patent, although not well evaluated on this exam due to timing the contrast bolus. Anatomic variants: Fetal type PCAs with diminutive vertebrobasilar system. No aneurysm or vascular malformation. Delayed phase: No pathologic enhancement. IMPRESSION: 1. No acute intracranial process identified. 2. 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 Attenuated flow throughout the diminutive vertebrobasilar system with superimposed multifocal atheromatous irregularity, with severe stenosis at the level of the vertebrobasilar junction. 3. Occlusion of the left A2 segment, of unknown chronicity. No CT findings to suggest  evolving left ACA territory infarct, although evaluation with MRI would be warranted if there is concern for acute ischemia. 4. Additional age related atheromatous irregularity involving the anterior and posterior circulation as above. Electronically Signed   By: Jeannine Boga M.D.   On: 04/15/2016 01:31    Assessment: 82 y.o. male  who presents with an episode of slurred speech yesterday, followed by blurring of vision in the left side in the evening. Patient states that his speech issue resolved and he didn't think much of his time, but his vision problem was persistent and so he came to the ED for evaluation. Currently appears to have [place] (L homonomous hemianopsia)    Stroke Risk Factors - family history, hyperlipidemia and hypertension   Pt had CTA head which showed b/l fetal PCAs ( meaning PCA's originate from anterior circulation and are supplied by carotids rather then posterior circulation) Very minimal flow in the posterior circulation and the L A2  Plan: 1. HgbA1c, fasting lipid panel 2. MRI, MRA  of the brain without contrast 3. PT consult, OT consult, Speech consult 4. Echocardiogram 5. Carotid dopplers 6. Prophylactic therapy-Antiplatelet med: Aspirin - dose 81 and Antiplatelet med: Plavix - dose 75 7. NPO until RN stroke swallow screen 8. Telemetry monitoring 9. Frequent neuro checks 10. Pt was on ASA, I will change to dual anti platelet therapy as significant intracranial stenosis  . Leotis Pain

## 2016-04-15 NOTE — H&P (Signed)
[place] [facility] [place] NAME: Darrell Clark    MR#:  [id number]  DATE OF BIRTH:  11-Mar-1933  DATE OF ADMISSION:  04/14/2016  PRIMARY CARE PHYSICIAN: Marden Noble, MD   REQUESTING/REFERRING PHYSICIAN: Owens Shark, MD  CHIEF COMPLAINT:   Chief Complaint  Patient presents with  . Visual Field Change    Pt. states vision change in lt. eye    HISTORY OF PRESENT ILLNESS:  Darrell Clark  is a 82 y.o. male who presents with An episode of slurred speech in the morning, followed by blurring of vision in the left side in the evening. Patient states that his speech issue resolved and he didn't think much of his time, but his vision problem was persistent and so he came to the ED for evaluation. Here he had a CTA head which showed posterior circulation disease as well as some anterior territory disease. Neurology was contacted by ED physician and felt that he was potentially having a stroke and requested admission. Hospitalists were called for the same.  PAST MEDICAL HISTORY:   Past Medical History:  Diagnosis Date  . Allergy   . Anxiety   . Arthritis   . CAD (coronary artery disease)   . Diabetes mellitus without complication ([place])   . GERD (gastroesophageal reflux disease)   . HLD (hyperlipidemia)   . Hypertension   . Prostate cancer ([place])     PAST SURGICAL HISTORY:   Past Surgical History:  Procedure Laterality Date  . APPENDECTOMY      SOCIAL HISTORY:   Social History  Substance Use Topics  . Smoking status: Former Research scientist (life sciences)  . Smokeless tobacco: Not on file     Comment: quit 10 years ago   . Alcohol use No    FAMILY HISTORY:   Family History  Problem Relation Age of Onset  . Heart attack Mother   . Hypertension Mother   . Heart attack Father   . Hypertension Father   . Hypertension Sister   . Cancer Brother   . Hypertension Brother     DRUG ALLERGIES:   Allergies  Allergen Reactions  . Niacin Itching  .  Niacin And Related Rash    MEDICATIONS AT HOME:   Prior to Admission medications   Medication Sig Start Date End Date Taking? Authorizing Provider  amLODipine (NORVASC) 5 MG tablet Take 5 mg by mouth daily.  02/21/13  Yes Historical Provider, MD  benazepril (LOTENSIN) 20 MG tablet Take 20 mg by mouth daily.  02/21/13  Yes Historical Provider, MD  donepezil (ARICEPT) 10 MG tablet Take 10 mg by mouth at bedtime.  02/21/13  Yes Historical Provider, MD  GLIPIZIDE XL 5 MG 24 hr tablet Take 5 mg by mouth daily with breakfast.  02/21/13  Yes Historical Provider, MD  NEOMYCIN-POLYMYXIN-HYDROCORTISONE (CORTISPORIN) 1 % SOLN otic solution Apply 1-2 drops to toe BID after soaking 03/26/16  Yes Max T [place], DPM  ONE TOUCH ULTRA TEST test strip  04/16/13  Yes Historical Provider, MD  Glory Rosebush DELICA LANCETS [id number] [place]  04/16/13  Yes Historical Provider, MD  simvastatin (ZOCOR) 20 MG tablet Take 20 mg by mouth daily at 6 PM.  03/09/13  Yes Historical Provider, MD  fluticasone (FLONASE) 50 MCG/ACT nasal spray Place 2 sprays into both nostrils daily.  04/16/13   Historical Provider, MD  lactulose ([place]) 10 GM/15ML solution  01/27/13   Historical Provider, MD  omeprazole (PRILOSEC) 20 MG capsule  07/24/13  Historical Provider, MD  pantoprazole (PROTONIX) 40 MG tablet Take 40 mg by mouth daily.  08/06/13   Historical Provider, MD    REVIEW OF SYSTEMS:  Review of Systems  Constitutional: Negative for chills, fever, malaise/fatigue and weight loss.  HENT: Negative for ear pain, hearing loss and tinnitus.   Eyes: Positive for blurred vision. Negative for double vision, pain and redness.  Respiratory: Negative for cough, hemoptysis and shortness of breath.   Cardiovascular: Negative for chest pain, palpitations, orthopnea and leg swelling.  Gastrointestinal: Negative for abdominal pain, constipation, diarrhea, nausea and vomiting.  Genitourinary: Negative for dysuria, frequency and hematuria.  Musculoskeletal:  Negative for back pain, joint pain and neck pain.  Skin:       No acne, rash, or lesions  Neurological: Positive for speech change. Negative for dizziness, tremors, focal weakness and weakness.  Endo/Heme/Allergies: Negative for polydipsia. Does not bruise/bleed easily.  Psychiatric/Behavioral: Negative for depression. The patient is not nervous/anxious and does not have insomnia.      VITAL SIGNS:   Vitals:   04/14/16 2338 04/15/16 0100 04/15/16 0115 04/15/16 0216  BP:  131/65  105/66  Pulse:  (!) 59  63  Resp:  19  19  Temp:   98.1 F (36.7 C) 98.1 F (36.7 C)  TempSrc:    Oral  SpO2: 100% 98%  98%  Weight:      Height:       Wt Readings from Last 3 Encounters:  04/14/16 102.1 kg (225 lb)  08/17/13 104.3 kg (230 lb)  04/20/13 102.1 kg (225 lb)    PHYSICAL EXAMINATION:  Physical Exam  Vitals reviewed. Constitutional: He is oriented to person, place, and time. He appears well-developed and well-nourished. No distress.  HENT:  Head: Normocephalic and atraumatic.  Mouth/Throat: Oropharynx is clear and moist.  Eyes: Conjunctivae and EOM are normal. Pupils are equal, round, and reactive to light. No scleral icterus.  Neck: Normal range of motion. Neck supple. No JVD present. No thyromegaly present.  Cardiovascular: Normal rate, regular rhythm and intact distal pulses.  Exam reveals no gallop and no friction rub.   No murmur heard. Respiratory: Effort normal and breath sounds normal. No respiratory distress. He has no wheezes. He has no rales.  GI: Soft. Bowel sounds are normal. He exhibits no distension. There is no tenderness.  Musculoskeletal: Normal range of motion. He exhibits no edema.  No arthritis, no gout  Lymphadenopathy:    He has no cervical adenopathy.  Neurological: He is alert and oriented to person, place, and time. No cranial nerve deficit.  Neurologic: Cranial nerves II-XII intact, patient has not lost total vision on the left side on visual field testing,  but does state that he has blurred vision on the left side, Sensation intact to light touch/pinprick, 5/5 strength in all extremities, no dysarthria, no aphasia, no dysphagia, memory intact, no pronator drift, DTR intact, Babinski sign not present.   Skin: Skin is warm and dry. No rash noted. No erythema.  Psychiatric: He has a normal mood and affect. His behavior is normal. Judgment and thought content normal.    LABORATORY PANEL:   CBC  Recent Labs Lab 04/15/16 0052  WBC 6.6  HGB 16.3  HCT 46.7  PLT 158   ------------------------------------------------------------------------------------------------------------------  Chemistries   Recent Labs Lab 04/15/16 0007  NA 143  K 3.7  CL 108  CO2 28  GLUCOSE 102*  BUN 13  CREATININE 0.85  CALCIUM 9.1  AST 21  ALT  17  ALKPHOS 68  BILITOT 0.6   ------------------------------------------------------------------------------------------------------------------  Cardiac Enzymes  Recent Labs Lab 04/15/16 0007  TROPONINI <0.03   ------------------------------------------------------------------------------------------------------------------  RADIOLOGY:  Ct Angio Head W Or Wo Contrast  Result Date: 04/15/2016 CLINICAL DATA:  Initial valuation for dizziness, slurred speech. Left visual changes. EXAM: CT ANGIOGRAPHY HEAD TECHNIQUE: Multidetector CT imaging of the head was performed using the standard protocol during bolus administration of intravenous contrast. Multiplanar CT image reconstructions and MIPs were obtained to evaluate the vascular anatomy. CONTRAST:  75 cc of Isovue view 370. COMPARISON:  Prior CT from 08/15/2006. FINDINGS: CT HEAD Brain: Cerebral volume normal for age. Minimal chronic microvascular ischemic disease, also felt to be within normal limits for age. Remote lacunar infarct within the right thalamus noted. Negative for acute intracranial hemorrhage. No acute infarct identified. No mass lesion, midline  shift or mass effect. No hydrocephalus. No extra-axial fluid collection. Vascular: Prominent vascular calcifications are within the carotid siphons. Skull: Scalp soft tissues demonstrate no acute abnormality. Sinuses: Visualized paranasal sinuses are clear. No mastoid effusion. Orbits: Visualized globes and orbits within normal limits. CTA HEAD Anterior circulation: The distal cervical segments patent bilaterally. Petrous segments widely patent. Scattered calcified atheromatous plaque within the cavernous/ supraclinoid ICAs with mild to moderate multi focal narrowing. Changes worse on the right. A1 segments patent. Left A1 segment is hypoplastic. Dominant right A2 segment which is widely patent to its distal aspect. Left A2 segment hypoplastic and small, with apparent occlusion (series 10, image 52). Atheromatous regularity throughout the anterior cerebral arteries. M1 segments patent without stenosis or occlusion. MCA bifurcations normal. Focal plaque at proximal M2 segment on the left with moderate stenosis (series 10, image 30). MCA branches opacified distally and symmetric. Atheromatous regularity throughout the MCA branches bilaterally. Posterior circulation: Attenuated flow within the vertebral arteries which are diminutive bilaterally. Left vertebral artery is dominant. Scattered atheromatous plaque within the V4 segments. There is fairly severe narrowing near the level of the vertebrobasilar junction (series 12, image 99). Basilar artery diminutive and attenuated distally with multi focal irregularity and moderate diffuse narrowing, but is patent to its distal aspect. Superior cerebral arteries patent bilaterally. There is fetal origin of the posterior cerebral arteries bilaterally, supplied via patent posterior communicating arteries. PCAs demonstrate multifocal atheromatous irregularity (worse on the left) but are supplied to their distal aspects. Venous sinuses: Grossly patent, although not well evaluated  on this exam due to timing the contrast bolus. Anatomic variants: Fetal type PCAs with diminutive vertebrobasilar system. No aneurysm or vascular malformation. Delayed phase: No pathologic enhancement. IMPRESSION: 1. No acute intracranial process identified. 2. 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 Attenuated flow throughout the diminutive vertebrobasilar system with superimposed multifocal atheromatous irregularity, with severe stenosis at the level of the vertebrobasilar junction. 3. Occlusion of the left A2 segment, of unknown chronicity. No CT findings to suggest evolving left ACA territory infarct, although evaluation with MRI would be warranted if there is concern for acute ischemia. 4. Additional age related atheromatous irregularity involving the anterior and posterior circulation as above. Electronically Signed   By: Jeannine Boga M.D.   On: 04/15/2016 01:31    EKG:   Orders placed or performed during the hospital encounter of 04/14/16  . EKG 12-Lead  . EKG 12-Lead  . ED EKG  . ED EKG    IMPRESSION AND PLAN:  Principal Problem:   CVA (cerebral vascular accident) ([place]) - admission via stroke order set with corresponding imaging and labs. CTA  cause and a question the possibility of a posterior circulation stroke given that he has posterior circulation disease, and this could potentially explain his visual disturbances as well as his speech disturbance. However, atheroembolic disease is also not excluded at this time. Neurology consult Active Problems:   HTN (hypertension) - permissive hypertension for the first 24 hours after onset of symptoms, goal less than 220/120   Diabetes ([place]) - sliding scale insulin with corresponding glucose checks   GERD (gastroesophageal reflux disease) - home dose PPI   HLD (hyperlipidemia) - home dose statin  All the records are reviewed and case discussed with ED provider. Management plans discussed with the  patient and/or family.  DVT PROPHYLAXIS: SubQ lovenox  GI PROPHYLAXIS: PPI  ADMISSION STATUS: Observation  CODE STATUS: Full Code Status History    This patient does not have a recorded code status. Please follow your organizational policy for patients in this situation.      TOTAL TIME TAKING CARE OF THIS PATIENT: 40 minutes.    Nathasha Fiorillo [place] 04/15/2016, 2:44 AM  [facility]  Office  [phone]  CC: Primary care physician; Marden Noble, MD

## 2016-04-15 NOTE — Clinical Social Work Note (Signed)
CSW received consult for AD and insurance questions for the patient's wife's insurance. CSW will direct the patient to discuss the matter with representatives at [employer]. AD/HCPOA is handled by Chaplain's service/Pastoral Care. CSW signing off.  Santiago Bumpers, MSW, LCSW-A [phone]

## 2016-04-15 NOTE — Care Management Obs Status (Signed)
[place] NOTIFICATION   Patient Details  Name: Darrell Clark MRN: [id number] Date of Birth: Feb 27, 1933   Medicare Observation Status Notification Given:  Yes    Ival Bible, RN 04/15/2016, 4:14 PM

## 2016-04-15 NOTE — Care Management Note (Signed)
Case Management Note  Patient Details  Name: Darrell Clark MRN: [id number] Date of Birth: 13-Jan-1933  Subjective/Objective:                    Action/Plan:   Expected Discharge Date:  04/17/16               Expected Discharge Plan:     In-House Referral:     Discharge planning Services     Post Acute Care Choice:    Choice offered to:     DME Arranged:    DME Agency:     HH Arranged:    [place] Agency:     Status of Service:     If discussed at [employer] of Stay Meetings, dates discussed:    Additional Comments:Case reviewed with Dr Doy Hutching MRI findings negative for acute infarcts continue observation status. MRI finding discussed with Dr Ether Griffins. Case closed.  Ival Bible, RN 04/15/2016, 2:15 PM

## 2016-04-16 DIAGNOSIS — I679 Cerebrovascular disease, unspecified: Secondary | ICD-10-CM

## 2016-04-16 DIAGNOSIS — H53139 Sudden visual loss, unspecified eye: Secondary | ICD-10-CM

## 2016-04-16 DIAGNOSIS — I63422 Cerebral infarction due to embolism of left anterior cerebral artery: Secondary | ICD-10-CM

## 2016-04-16 DIAGNOSIS — H53132 Sudden visual loss, left eye: Secondary | ICD-10-CM | POA: Diagnosis not present

## 2016-04-16 LAB — HEMOGLOBIN A1C
Hgb A1c MFr Bld: 5.9 % — ABNORMAL HIGH (ref 4.8–5.6)
MEAN PLASMA GLUCOSE: 123 mg/dL

## 2016-04-16 LAB — GLUCOSE, CAPILLARY
GLUCOSE-CAPILLARY: 152 mg/dL — AB (ref 65–99)
Glucose-Capillary: 122 mg/dL — ABNORMAL HIGH (ref 65–99)

## 2016-04-16 MED ORDER — CLOPIDOGREL BISULFATE 75 MG PO TABS: 75.0000 mg | ORAL_TABLET | Freq: Every day | ORAL | 5 refills | Status: DC

## 2016-04-16 MED ORDER — GLIPIZIDE ER 2.5 MG PO TB24: 2.5000 mg | ORAL_TABLET | Freq: Every day | ORAL | 6 refills | Status: DC

## 2016-04-16 MED ORDER — ACETAMINOPHEN 325 MG PO TABS
650.0000 mg | ORAL_TABLET | Freq: Four times a day (QID) | ORAL | Status: DC | PRN
  Administered 2016-04-16: 650 mg via ORAL
  Filled 2016-04-16: qty 2

## 2016-04-16 MED ORDER — ASPIRIN 81 MG PO TBEC: 81.0000 mg | DELAYED_RELEASE_TABLET | Freq: Every day | ORAL | 5 refills | Status: AC

## 2016-04-16 NOTE — Progress Notes (Addendum)
1:34 PM Insurance authorization has been obtained [id number].  Patient is currently working to reach family for transport.   RN aware of discharge and will call for report.  10:59 AM  Patient has been accepted to [facility] for transport today. Amy with [facility] made aware, will follow up with insurance authorization. Patient calling family in effort to arrange for transport. [employer] has reviewed clincals and authorized for short term rehab. Call placed to [facility]/patient's first choice for placement. Reviewing patient and will follow up.  Plan is for DC today: SNF.   LCSW is aware of consult and have called [employer] regarding insurance authorization for [facility].  Staffed case with Amy who is going to review clinicals to see if patient is appropriate for SNF.  Will await review and call back regarding disposition.  Lane Hacker, MSW Clinical Social Work: [employer] Coverage for :  [phone]

## 2016-04-16 NOTE — Discharge Summary (Signed)
[facility] NAME: Darrell Clark    MR#:  [id number]  DATE OF BIRTH:  24-Apr-1933  DATE OF ADMISSION:  04/14/2016 ADMITTING PHYSICIAN: Lance Coon, MD  DATE OF DISCHARGE: No discharge date for patient encounter.  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 Allergy   . Anxiety   . Arthritis   . CAD (coronary artery disease)   . Diabetes mellitus without complication ([place])   . GERD (gastroesophageal reflux disease)   . HLD (hyperlipidemia)   . Hypertension   . Prostate cancer ([place])     .pro HOSPITAL COURSE:  The patient is a 82 year old African-American male with medical history significant for history of coronary artery disease, diabetes mellitus, hyperlipidemia, hypertension, who presents to the hospital with slurred speech, which started in the morning of admission, followed by blurring of the vision in the left eye in the right side of  visual field. Neurologist feels that patient has left homonymous hemianopsia, recommended adding Plavix to aspirin therapy due to intracranial vascular stenosis. Patient was seen by ophthalmologist who felt that patient had diffuse retinal ischemia of left eye, concerning for embolism via left anterior cerebral artery . Patient was seen by physical therapist and recommended skilled nursing facility placement. Discussion by problem: #1. Stroke in left eye leaving patient was left eye. Visual deficit in the medial visual field, patient is to  continue aspirin and Plavix, per neurologist recommendations due to intracranial vascular stenosis noted on CT angiogram. Patient was seen by physical therapist and recommended skilled nursing facility placement. I due to significant visual deficit. The patient was advised not to drive. #2. Essential hypertension, patient's blood pressure is around 0000000 systolic, we are reinstating Norvasc, holding all other (medications, to be reinstated in about 1 week after acute stroke #3. Hyperlipidemia, LDL was 47, continue Zocor #4 diabetes mellitus, hemoglobin A1c 5.9, continue patient on diabetic diet, decrease glipizide dose to 2.5 mg daily, follow blood glucose levels closely. #5. Slurred speech, Improved with conservative therapy, possibly related to TIA versus recent stroke, continue aspirin and Plavix combination #6 intracranial vascular stenosis, seen on CT angiogram on admission, neurologist recommends aspirin and Plavix. Follow-up with neurologist as outpatient for further recommendations   DISCHARGE CONDITIONS:   Stable  CONSULTS OBTAINED:  Treatment Team:  Leotis Pain, MD  DRUG ALLERGIES:   Allergies  Allergen Reactions  . Niacin Itching  . Niacin And Related Rash    DISCHARGE MEDICATIONS:   Current Discharge Medication List    START taking these medications   Details  aspirin EC 81 MG EC tablet Take 1 tablet (81 mg total) by mouth daily. Qty: 30 tablet, Refills: 5    clopidogrel (PLAVIX) 75 MG tablet Take 1 tablet (75 mg total) by mouth daily. Qty: 30 tablet, Refills: 5      CONTINUE these medications which have CHANGED   Details  glipiZIDE (GLUCOTROL XL) 2.5 MG 24 hr tablet Take 1 tablet (2.5 mg total) by mouth daily with breakfast. Qty:  30 tablet, Refills: 6      CONTINUE these medications which have NOT CHANGED   Details  amLODipine (NORVASC) 5 MG tablet Take 5 mg by mouth daily.     donepezil (ARICEPT) 10 MG tablet Take 10 mg by mouth at bedtime.      NEOMYCIN-POLYMYXIN-HYDROCORTISONE (CORTISPORIN) 1 % SOLN otic solution Apply 1-2 drops to toe BID after soaking Qty: 10 mL, Refills: 1    ONE TOUCH ULTRA TEST test strip     ONETOUCH DELICA LANCETS [id number] MISC     simvastatin (ZOCOR) 20 MG tablet Take 20 mg by mouth daily at 6 PM.     fluticasone (FLONASE) 50 MCG/ACT nasal spray Place 2 sprays into both nostrils daily.     lactulose (CHRONULAC) 10 GM/15ML solution     omeprazole (PRILOSEC) 20 MG capsule     pantoprazole (PROTONIX) 40 MG tablet Take 40 mg by mouth daily.       STOP taking these medications     benazepril (LOTENSIN) 20 MG tablet          DISCHARGE INSTRUCTIONS:    Patient is to follow-up with primary care physician, neurologist as outpatient  If you experience worsening of your admission symptoms, develop shortness of breath, life threatening emergency, suicidal or homicidal thoughts you must seek medical attention immediately by calling 911 or calling your MD immediately  if symptoms less severe.  You Must read complete instructions/literature along with all the possible adverse reactions/side effects for all the Medicines you take and that have been prescribed to you. Take any new Medicines after you have completely understood and accept all the possible adverse reactions/side effects.   Please note  You were cared for by a hospitalist during your hospital stay. If you have any questions about your discharge medications or the care you received while you were in the hospital after you are discharged, you can call the unit and asked to speak with the hospitalist on call if the hospitalist that took care of you is not available. Once you are discharged, your primary care physician will handle any further medical issues. Please note that NO REFILLS for any discharge medications will be authorized once you are discharged, as it is imperative that you return to your primary care physician (or establish a relationship  with a primary care physician if you do not have one) for your aftercare needs so that they can reassess your need for medications and monitor your lab values.    Today   CHIEF COMPLAINT:   Chief Complaint  Patient presents with  . Visual Field Change    Pt. states vision change in lt. eye    HISTORY OF PRESENT ILLNESS:  Darrell Clark  is a 82 y.o. male with a known history of coronary artery disease, diabetes mellitus, hyperlipidemia, hypertension, who presents to the hospital with slurred speech, which started in the morning of admission, followed by blurring of the vision in the left eye in the right side of  visual field. Neurologist feels that patient has left homonymous hemianopsia, recommended adding Plavix to aspirin therapy due to intracranial vascular stenosis. Patient was seen by ophthalmologist who felt that patient had diffuse retinal ischemia of left eye, concerning for embolism via left anterior cerebral artery . Patient was seen by physical therapist and recommended skilled nursing facility placement. Discussion by problem: #1. Stroke in left eye leaving patient was left eye. Visual deficit in the medial visual field, patient is to continue aspirin and  Plavix, per neurologist recommendations due to intracranial vascular stenosis noted on CT angiogram. Patient was seen by physical therapist and recommended skilled nursing facility placement. I due to significant visual deficit. The patient was advised not to drive. #2. Essential hypertension, patient's blood pressure is around 0000000 systolic, we are reinstating Norvasc, holding all other (medications, to be reinstated in about 1 week after acute stroke #3. Hyperlipidemia, LDL was 47, continue Zocor #4 diabetes mellitus, hemoglobin A1c 5.9, continue patient on diabetic diet, decrease glipizide dose to 2.5 mg daily, follow blood glucose levels closely. #5. Slurred speech, Improved with conservative therapy, possibly related to TIA  versus recent stroke, continue aspirin and Plavix combination #6 intracranial vascular stenosis, seen on CT angiogram on admission, neurologist recommends aspirin and Plavix. Follow-up with neurologist as outpatient for further recommendations     VITAL SIGNS:  Blood pressure (!) 160/81, pulse 62, temperature 98.5 F (36.9 C), temperature source Oral, resp. rate 18, height 5\' 11"  (1.803 m), weight 102.1 kg (225 lb), SpO2 100 %.  I/O:   Intake/Output Summary (Last 24 hours) at 04/16/16 0929 Last data filed at 04/16/16 0926  Gross per 24 hour  Intake              600 ml  Output             1325 ml  Net             -725 ml    PHYSICAL EXAMINATION:  GENERAL:  82 y.o.-year-old patient lying in the bed with no acute distress.  EYES: Pupils equal, round, reactive to light and accommodation. No scleral icterus. Extraocular muscles intact.  HEENT: Head atraumatic, normocephalic. Oropharynx and nasopharynx clear.  NECK:  Supple, no jugular venous distention. No thyroid enlargement, no tenderness.  LUNGS: Normal breath sounds bilaterally, no wheezing, rales,rhonchi or crepitation. No use of accessory muscles of respiration.  CARDIOVASCULAR: S1, S2 normal. No murmurs, rubs, or gallops.  ABDOMEN: Soft, non-tender, non-distended. Bowel sounds present. No organomegaly or mass.  EXTREMITIES: No pedal edema, cyanosis, or clubbing.  NEUROLOGIC: Cranial nerves II through XII are intact. Muscle strength 5/5 in all extremities. Sensation intact. Gait not checked.  PSYCHIATRIC: The patient is alert and oriented x 3.  SKIN: No obvious rash, lesion, or ulcer.   DATA REVIEW:   CBC  Recent Labs Lab 04/15/16 0052  WBC 6.6  HGB 16.3  HCT 46.7  PLT 158    Chemistries   Recent Labs Lab 04/15/16 0007  NA 143  K 3.7  CL 108  CO2 28  GLUCOSE 102*  BUN 13  CREATININE 0.85  CALCIUM 9.1  AST 21  ALT 17  ALKPHOS 68  BILITOT 0.6    Cardiac Enzymes  Recent Labs Lab 04/15/16 0007   TROPONINI <0.03    Microbiology Results  No results found for this or any previous visit.  RADIOLOGY:  Ct Angio Head W Or Wo Contrast  Result Date: 04/15/2016 CLINICAL DATA:  Initial valuation for dizziness, slurred speech. Left visual changes. EXAM: CT ANGIOGRAPHY HEAD TECHNIQUE: Multidetector CT imaging of the head was performed using the standard protocol during bolus administration of intravenous contrast. Multiplanar CT image reconstructions and MIPs were obtained to evaluate the vascular anatomy. CONTRAST:  75 cc of Isovue view 370. COMPARISON:  Prior CT from 08/15/2006. FINDINGS: CT HEAD Brain: Cerebral volume normal for age. Minimal chronic microvascular ischemic disease, also felt to be within normal limits for age. Remote lacunar infarct within the right thalamus noted.  Negative for acute intracranial hemorrhage. No acute infarct identified. No mass lesion, midline shift or mass effect. No hydrocephalus. No extra-axial fluid collection. Vascular: Prominent vascular calcifications are within the carotid siphons. Skull: Scalp soft tissues demonstrate no acute abnormality. Sinuses: Visualized paranasal sinuses are clear. No mastoid effusion. Orbits: Visualized globes and orbits within normal limits. CTA HEAD Anterior circulation: The distal cervical segments patent bilaterally. Petrous segments widely patent. Scattered calcified atheromatous plaque within the cavernous/ supraclinoid ICAs with mild to moderate multi focal narrowing. Changes worse on the right. A1 segments patent. Left A1 segment is hypoplastic. Dominant right A2 segment which is widely patent to its distal aspect. Left A2 segment hypoplastic and small, with apparent occlusion (series 10, image 52). Atheromatous regularity throughout the anterior cerebral arteries. M1 segments patent without stenosis or occlusion. MCA bifurcations normal. Focal plaque at proximal M2 segment on the left with moderate stenosis (series 10, image 30).  MCA branches opacified distally and symmetric. Atheromatous regularity throughout the MCA branches bilaterally. Posterior circulation: Attenuated flow within the vertebral arteries which are diminutive bilaterally. Left vertebral artery is dominant. Scattered atheromatous plaque within the V4 segments. There is fairly severe narrowing near the level of the vertebrobasilar junction (series 12, image 99). Basilar artery diminutive and attenuated distally with multi focal irregularity and moderate diffuse narrowing, but is patent to its distal aspect. Superior cerebral arteries patent bilaterally. There is fetal origin of the posterior cerebral arteries bilaterally, supplied via patent posterior communicating arteries. PCAs demonstrate multifocal atheromatous irregularity (worse on the left) but are supplied to their distal aspects. Venous sinuses: Grossly patent, although not well evaluated on this exam due to timing the contrast bolus. Anatomic variants: Fetal type PCAs with diminutive vertebrobasilar system. No aneurysm or vascular malformation. Delayed phase: No pathologic enhancement. IMPRESSION: 1. No acute intracranial process identified. 2. 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 Attenuated flow throughout the diminutive vertebrobasilar system with superimposed multifocal atheromatous irregularity, with severe stenosis at the level of the vertebrobasilar junction. 3. Occlusion of the left A2 segment, of unknown chronicity. No CT findings to suggest evolving left ACA territory infarct, although evaluation with MRI would be warranted if there is concern for acute ischemia. 4. Additional age related atheromatous irregularity involving the anterior and posterior circulation as above. Electronically Signed   By: Jeannine Boga M.D.   On: 04/15/2016 01:31   Mr Angiogram Neck W Contrast  Result Date: 04/15/2016 CLINICAL DATA:  Stroke. Diabetes, hypertension,  hyperlipidemia. Slurred speech EXAM: MR HEAD WITHOUT CONTRAST MR CIRCLE OF WILLIS WITHOUT CONTRAST MRA OF THE NECK WITHOUT AND WITH CONTRAST TECHNIQUE: Multiplanar, multiecho pulse sequences of the brain, circle of willis and surrounding structures were obtained without intravenous contrast. Angiographic images of the neck were obtained using MRA technique without and with intravenous contrast. CONTRAST:  23mL MULTIHANCE GADOBENATE DIMEGLUMINE 529 MG/ML IV SOLN COMPARISON:  CTA 04/15/2016 FINDINGS: MR HEAD FINDINGS Brain: Negative for acute infarct. Chronic microvascular ischemic change in the white matter. Chronic infarcts in the thalamus bilaterally. Brainstem and cerebellum intact. Negative for hemorrhage or mass. Pituitary normal in size. Vascular: Abnormal signal in the distal right vertebral artery. Flow voids in the remainder of the circle of Willis within normal limits. Skull and upper cervical spine: No acute skeletal abnormality. Degenerative changes C1-C2. Sinuses/Orbits: Negative Other: None MR CIRCLE OF WILLIS FINDINGS Severe stenosis distal right vertebral artery. Moderate stenosis distal left vertebral artery. Basilar is patent. AICA patent. Diffuse basilar atherosclerotic disease with mild stenosis. Diffuse posterior  cerebral artery atherosclerotic disease with moderate stenosis bilaterally. Fetal origin of the posterior cerebral artery bilaterally. Superior cerebellar arteries patent bilaterally. Atherosclerotic irregularity in the cavernous carotid with mild stenosis bilaterally. Anterior and middle cerebral arteries patent bilaterally. Mild stenosis of the distal right M1 segment and right middle cerebral artery bifurcation. Left middle cerebral artery branches patent. Occlusion of the left anterior cerebral artery A2 segment. MRA NECK FINDINGS Severe stenosis at the origin of the vertebral artery bilaterally. Severe stenosis distal right vertebral artery. Moderate stenosis distal left vertebral  artery. Carotid artery is patent bilaterally without significant stenosis. Mild atherosclerotic disease left carotid bulb. IMPRESSION: Negative for acute infarct. Chronic microvascular ischemic changes in the white matter and basal ganglia Severe stenosis at the origin of the vertebral artery bilaterally. Severe stenosis distal right vertebral artery and moderate stenosis distal left vertebral artery. Diffuse atherosclerotic disease in the basilar and posterior cerebral arteries bilaterally. Occlusion of the left anterior cerebral artery A2 segment. Mild atherosclerotic disease in the right middle cerebral artery. No significant carotid stenosis in the neck. Electronically Signed   By: Franchot Gallo M.D.   On: 04/15/2016 13:23   Mr Brain Wo Contrast  Result Date: 04/15/2016 CLINICAL DATA:  Stroke. Diabetes, hypertension, hyperlipidemia. Slurred speech EXAM: MR HEAD WITHOUT CONTRAST MR CIRCLE OF WILLIS WITHOUT CONTRAST MRA OF THE NECK WITHOUT AND WITH CONTRAST TECHNIQUE: Multiplanar, multiecho pulse sequences of the brain, circle of willis and surrounding structures were obtained without intravenous contrast. Angiographic images of the neck were obtained using MRA technique without and with intravenous contrast. CONTRAST:  72mL MULTIHANCE GADOBENATE DIMEGLUMINE 529 MG/ML IV SOLN COMPARISON:  CTA 04/15/2016 FINDINGS: MR HEAD FINDINGS Brain: Negative for acute infarct. Chronic microvascular ischemic change in the white matter. Chronic infarcts in the thalamus bilaterally. Brainstem and cerebellum intact. Negative for hemorrhage or mass. Pituitary normal in size. Vascular: Abnormal signal in the distal right vertebral artery. Flow voids in the remainder of the circle of Willis within normal limits. Skull and upper cervical spine: No acute skeletal abnormality. Degenerative changes C1-C2. Sinuses/Orbits: Negative Other: None MR CIRCLE OF WILLIS FINDINGS Severe stenosis distal right vertebral artery. Moderate  stenosis distal left vertebral artery. Basilar is patent. AICA patent. Diffuse basilar atherosclerotic disease with mild stenosis. Diffuse posterior cerebral artery atherosclerotic disease with moderate stenosis bilaterally. Fetal origin of the posterior cerebral artery bilaterally. Superior cerebellar arteries patent bilaterally. Atherosclerotic irregularity in the cavernous carotid with mild stenosis bilaterally. Anterior and middle cerebral arteries patent bilaterally. Mild stenosis of the distal right M1 segment and right middle cerebral artery bifurcation. Left middle cerebral artery branches patent. Occlusion of the left anterior cerebral artery A2 segment. MRA NECK FINDINGS Severe stenosis at the origin of the vertebral artery bilaterally. Severe stenosis distal right vertebral artery. Moderate stenosis distal left vertebral artery. Carotid artery is patent bilaterally without significant stenosis. Mild atherosclerotic disease left carotid bulb. IMPRESSION: Negative for acute infarct. Chronic microvascular ischemic changes in the white matter and basal ganglia Severe stenosis at the origin of the vertebral artery bilaterally. Severe stenosis distal right vertebral artery and moderate stenosis distal left vertebral artery. Diffuse atherosclerotic disease in the basilar and posterior cerebral arteries bilaterally. Occlusion of the left anterior cerebral artery A2 segment. Mild atherosclerotic disease in the right middle cerebral artery. No significant carotid stenosis in the neck. Electronically Signed   By: Franchot Gallo M.D.   On: 04/15/2016 13:23   Mr Jodene Nam Head/brain [id number] Cm  Result Date: 04/15/2016 CLINICAL DATA:  Stroke. Diabetes, hypertension,  hyperlipidemia. Slurred speech EXAM: MR HEAD WITHOUT CONTRAST MR CIRCLE OF WILLIS WITHOUT CONTRAST MRA OF THE NECK WITHOUT AND WITH CONTRAST TECHNIQUE: Multiplanar, multiecho pulse sequences of the brain, circle of willis and surrounding structures were obtained  without intravenous contrast. Angiographic images of the neck were obtained using MRA technique without and with intravenous contrast. CONTRAST:  58mL MULTIHANCE GADOBENATE DIMEGLUMINE 529 MG/ML IV SOLN COMPARISON:  CTA 04/15/2016 FINDINGS: MR HEAD FINDINGS Brain: Negative for acute infarct. Chronic microvascular ischemic change in the white matter. Chronic infarcts in the thalamus bilaterally. Brainstem and cerebellum intact. Negative for hemorrhage or mass. Pituitary normal in size. Vascular: Abnormal signal in the distal right vertebral artery. Flow voids in the remainder of the circle of Willis within normal limits. Skull and upper cervical spine: No acute skeletal abnormality. Degenerative changes C1-C2. Sinuses/Orbits: Negative Other: None MR CIRCLE OF WILLIS FINDINGS Severe stenosis distal right vertebral artery. Moderate stenosis distal left vertebral artery. Basilar is patent. AICA patent. Diffuse basilar atherosclerotic disease with mild stenosis. Diffuse posterior cerebral artery atherosclerotic disease with moderate stenosis bilaterally. Fetal origin of the posterior cerebral artery bilaterally. Superior cerebellar arteries patent bilaterally. Atherosclerotic irregularity in the cavernous carotid with mild stenosis bilaterally. Anterior and middle cerebral arteries patent bilaterally. Mild stenosis of the distal right M1 segment and right middle cerebral artery bifurcation. Left middle cerebral artery branches patent. Occlusion of the left anterior cerebral artery A2 segment. MRA NECK FINDINGS Severe stenosis at the origin of the vertebral artery bilaterally. Severe stenosis distal right vertebral artery. Moderate stenosis distal left vertebral artery. Carotid artery is patent bilaterally without significant stenosis. Mild atherosclerotic disease left carotid bulb. IMPRESSION: Negative for acute infarct. Chronic microvascular ischemic changes in the white matter and basal ganglia Severe stenosis at the  origin of the vertebral artery bilaterally. Severe stenosis distal right vertebral artery and moderate stenosis distal left vertebral artery. Diffuse atherosclerotic disease in the basilar and posterior cerebral arteries bilaterally. Occlusion of the left anterior cerebral artery A2 segment. Mild atherosclerotic disease in the right middle cerebral artery. No significant carotid stenosis in the neck. Electronically Signed   By: Franchot Gallo M.D.   On: 04/15/2016 13:23    EKG:   Orders placed or performed during the hospital encounter of 04/14/16  . EKG 12-Lead  . EKG 12-Lead  . ED EKG  . ED EKG      Management plans discussed with the patient, family and they are in agreement.  CODE STATUS:     Code Status Orders        Start     Ordered   04/15/16 0411  Full code  Continuous     04/15/16 0410    Code Status History    Date Active Date Inactive Code Status Order ID Comments User Context   This patient has a current code status but no historical code status.    Advance Directive Documentation   Flowsheet Row Most Recent Value  Type of Advance Directive  Healthcare Power of Attorney  Pre-existing out of facility DNR order (yellow form or pink MOST form)  No data  "MOST" Form in Place?  No data      TOTAL TIME TAKING CARE OF THIS PATIENT: 40 minutes.    Theodoro Grist M.D on 04/16/2016 at 9:29 AM  Between 7am to 6pm - Pager - [phone]  After 6pm go to [email or web address] - password [email or web address] [facility]  Office  [phone]  CC: Primary care physician; Marden Noble,  MD

## 2016-04-16 NOTE — Progress Notes (Signed)
PT Cancellation Note  Patient Details Name: Darrell Clark MRN: [id number] DOB: [birth year]   Cancelled Treatment:    Reason Eval/Treat Not Completed: Other (comment). Treatment attempted; pt eating/refused. Spoke with CM who notes pt is awaiting authorization to go to skilled nursing facility today. Re attempt if pt does not receive authorization today.    Larae Grooms, PTA 04/16/2016, 12:32 PM

## 2016-04-16 NOTE — Evaluation (Signed)
Occupational Therapy Evaluation Patient Details Name: [facility] MRN: [id number] DOB: 02/05/1933 Today's Date: 04/16/2016    History of Present Illness Patient is an 82 y/o male that presents with acute onset L homonymous hemianopsia and slurred speech (which has resolved). Patient reports he was visiting his wife in the nursing home and was talking to the nurse when his vision when blurry and he was having difficulty getting his words out.  The nurse called EMS and patient was transported to [facility] and admitted for evaluation.     Clinical Impression   Patient is a 82 yo male who was admitted to [facility] with slurred speech and blurred vision on the left.  He reports he feels weak all over, slightly more on the left than right side.  Patient evaluated by OT this date and presents with muscle weakness, left side greater than right.  Mild decreased grip on left side compared to right.  He was previously independent with basic self care tasks and now requires assistance with lower body bathing, dressing (left sock) and functional mobility to and from the bathroom.  He presents with impaired visual deficits with diagnosis of left homonymous hemianopsia and cannot currently drive.  He lives at home alone, his wife is currently in a nursing home for the last 4 months.  He has not been able to perform meal prep or homemaking tasks and has limited assistance from family or friends.  He drives daily to get his 3 meals a day out and to visit his wife which he takes 2 of her 3 meals.  He would benefit from skilled OT to maximize his safety and independence in self care and daily tasks prior to returning home.  He would likely benefit from short term rehab to further address self care deficits in order to return home alone safely.      Follow Up Recommendations  SNF    Equipment Recommendations       Recommendations for Other Services       Precautions / Restrictions Precautions Precautions:  Fall Restrictions Weight Bearing Restrictions: No      Mobility Bed Mobility Overal bed mobility: Needs Assistance Bed Mobility: Supine to Sit     Supine to sit: Min guard;Min assist        Transfers Overall transfer level: Needs assistance Equipment used: Rolling walker (2 wheeled) Transfers: Sit to/from Stand Sit to Stand: Min guard;Min assist              Balance                                            ADL Overall ADL's : Needs assistance/impaired Eating/Feeding: Independent   Grooming: Wash/dry hands;Wash/dry face;Applying deodorant Grooming Details (indicate cue type and reason): min to min guard to stand at the sink to perform grooming tasks.   Upper Body Bathing: Modified independent   Lower Body Bathing: Minimal assistance Lower Body Bathing Details (indicate cue type and reason): difficulty reaching to left foot and leg. Upper Body Dressing : Independent   Lower Body Dressing: Minimal assistance Lower Body Dressing Details (indicate cue type and reason): difficulty with putting on underwear but with increased time and cues able to complete with min guard, able to don and doff right sock, min assist to don and doff left sock.   Toilet Transfer: Tour manager Toilet;Grab bars  Toileting- Water quality scientist and Hygiene: Min guard       Functional mobility during ADLs: Min guard;Minimal assistance General ADL Comments: Patient with complaints of back and hip pain on the left side which limit his participation in self care tasks.  Slow to move, increased difficulty with sit to stand, performance improved after he takes a few steps towards bathroom.  He is very close to his wife and is concerned about not being able to drive to get himself food as well as get food for her and visit with her on a daily basis.  He is unable to cook or perform homemaking skills at home.       Vision Vision Assessment?: Yes;Vision impaired- to be  further tested in functional context Ocular Range of Motion: Restricted on the left Alignment/Gaze Preference: Gaze right Tracking/Visual Pursuits: Decreased smoothness of horizontal tracking;Other (comment);Requires cues, head turns, or add eye shifts to track (decreased smoothness to left mid field) Visual Fields: Left homonymous hemianopsia   Perception     Praxis      Pertinent Vitals/Pain Pain Assessment: 0-10 Pain Score: 4  Pain Location: back Pain Descriptors / Indicators: Aching Pain Intervention(s): Limited activity within patient's tolerance;Repositioned;Monitored during session;Premedicated before session     Hand Dominance Right   Extremity/Trunk Assessment Upper Extremity Assessment Upper Extremity Assessment: LUE deficits/detail RUE Deficits / Details: R grip and elbow flexion WNL, shoulder flexion limited to 100 degrees, strength 3+/5 overall LUE Deficits / Details: Left shoulder flexion limited to around 100 degrees, strength 3/5 at the shoulder and elbow, slight decrease in grip strength on left compared to right.     Lower Extremity Assessment Lower Extremity Assessment: Defer to PT evaluation       Communication Communication Communication: No difficulties   Cognition Arousal/Alertness: Awake/alert Behavior During Therapy: WFL for tasks assessed/performed Overall Cognitive Status: Within Functional Limits for tasks assessed                     General Comments       Exercises       Shoulder Instructions      Home Living Family/patient expects to be discharged to:: Private residence Living Arrangements: Alone (Patient's wife is currently at [facility] for the last 4 months, he drives there daily to visit and take her meals. ) Available Help at Discharge: Other (Comment) (Patient reports he has supportive family but assistance is limited.  His daughter is disabled and lives in [facility], son is a Engineer, structural who just married and works 12  hour days. ) Type of Home: House Home Access: [place]: One level     Bathroom Shower/Tub: Risk analyst characteristics: Door Biochemist, clinical: Standard Bathroom Accessibility: Yes   Home Equipment: [place] - single point;Bedside commode          Prior Functioning/Environment Level of Independence: Independent with assistive device(s)        Comments: Patient reports he used a cane in the past due to back pain.  He reports being independent with basic self care tasks, did not do housework and reports "the house is a mess" and he is unable to cook, picks up all meals.  States, "I don't know what I will do if I can't drive, it will kill me. I will be disabled."  It is important for him to be near his wife, and make sure she has meals.  He reports she cannot eat the meals  provided at [facility]         OT Problem List: Decreased strength;Impaired balance (sitting and/or standing);Pain;Decreased range of motion;Impaired vision/perception;Decreased safety awareness;Impaired UE functional use   OT Treatment/Interventions: Self-care/ADL training;DME and/or AE instruction;Therapeutic activities;Therapeutic exercise;Balance training;Neuromuscular education;Visual/perceptual remediation/compensation;Patient/family education    OT Goals(Current goals can be found in the care plan section) Acute Rehab OT Goals Patient Stated Goal: Patient would like to be as independent as he was before, return home and be able to drive to pick up food and visit his wife at the nursing home. OT Goal Formulation: With patient Time For Goal Achievement: 04/28/16 Potential to Achieve Goals: Good  OT Frequency: Min 1X/week   Barriers to D/C: Decreased caregiver support  Patient requiring minimal assist with basic self care tasks, cannot perform meal prep and is dependent on being able to pick up meals and driving.  Limited support from family per patient report.          Co-evaluation              End of Session Equipment Utilized During Treatment: Gait belt;Rolling walker  Activity Tolerance: Patient tolerated treatment well;Patient limited by pain Patient left: in bed;with call bell/phone within reach;with bed alarm set   Time: 1007-1045 OT Time Calculation (min): 38 min Charges:  OT General Charges $OT Visit: 1 Procedure OT Evaluation $OT Eval Low Complexity: 1 Procedure OT Treatments $Self Care/Home Management : 8-22 mins G-Codes: OT G-codes **NOT FOR INPATIENT CLASS** Functional Assessment Tool Used: clinical judgment, self care assessment, strength testing Functional Limitation: Self care Self Care Current Status [id number]): At least 20 percent but less than 40 percent impaired, limited or restricted Self Care Goal Status [id number]): At least 1 percent but less than 20 percent impaired, limited or restricted  [employer], OTR/L, CLT  04/16/2016, 12:22 PM

## 2016-04-16 NOTE — Progress Notes (Signed)
Subjective: Patient continues to have visual deficits.  Speech at baseline.    Objective: Current vital signs: BP 140/73 (BP Location: Left Arm)   Pulse 66   Temp 98.4 F (36.9 C)   Resp 20   Ht 5\' 11"  (1.803 m)   Wt 102.1 kg (225 lb)   SpO2 99%   BMI 31.38 kg/m  Vital signs in last 24 hours: Temp:  [98 F (36.7 C)-98.5 F (36.9 C)] 98.4 F (36.9 C) (10/30 1004) Pulse Rate:  [60-72] 66 (10/30 1004) Resp:  [18-23] 20 (10/30 1004) BP: (126-172)/(61-87) 140/73 (10/30 1004) SpO2:  [91 %-100 %] 99 % (10/30 1004)  Intake/Output from previous day: 10/29 0701 - 10/30 0700 In: 120 [P.O.:120] Out: 1400 [Urine:1400] Intake/Output this shift: Total I/O In: 600 [P.O.:600] Out: 125 [Urine:125] Nutritional status: Diet heart healthy/carb modified Room service appropriate? Yes; Fluid consistency: Thin Diet - low sodium heart healthy  Neurologic Exam: Mental Status: Alert, oriented, thought content appropriate.  Speech fluent without evidence of aphasia.  Able to follow 3 step commands without difficulty. Cranial Nerves: II: Discs flat bilaterally; decreased vision in the nasal visual field of the left eye.  VF's otherwise intact.   III,IV, VI: ptosis not present, extra-ocular motions intact bilaterally V,VII: smile symmetric, facial light touch sensation normal bilaterally VIII: hearing normal bilaterally IX,X: gag reflex present XI: bilateral shoulder shrug XII: midline tongue extension Motor: Right : Upper extremity   5/5    Left:     Upper extremity   5/5  Lower extremity   5/5     Lower extremity   5/5 Tone and bulk:normal tone throughout; no atrophy noted   Lab Results: Basic Metabolic Panel:  Recent Labs Lab 04/15/16 0007  NA 143  K 3.7  CL 108  CO2 28  GLUCOSE 102*  BUN 13  CREATININE 0.85  CALCIUM 9.1    Liver Function Tests:  Recent Labs Lab 04/15/16 0007  AST 21  ALT 17  ALKPHOS 68  BILITOT 0.6  PROT 7.5  ALBUMIN 4.0   No results for  input(s): LIPASE, AMYLASE in the last 168 hours. No results for input(s): AMMONIA in the last 168 hours.  CBC:  Recent Labs Lab 04/15/16 0052  WBC 6.6  HGB 16.3  HCT 46.7  MCV 94.2  PLT 158    Cardiac Enzymes:  Recent Labs Lab 04/15/16 0007  TROPONINI <0.03    Lipid Panel:  Recent Labs Lab 04/15/16 0455  CHOL 93  TRIG 74  HDL 31*  CHOLHDL 3.0  VLDL 15  LDLCALC 47    CBG:  Recent Labs Lab 04/15/16 1115 04/15/16 1627 04/15/16 2139 04/16/16 0358 04/16/16 1046  GLUCAP 132* 143* 136* 122* 152*    Microbiology: No results found for this or any previous visit.  Coagulation Studies:  Recent Labs  04/15/16 0007  LABPROT 12.6  INR 0.94    Imaging: Ct Angio Head W Or Wo Contrast  Result Date: 04/15/2016 CLINICAL DATA:  Initial valuation for dizziness, slurred speech. Left visual changes. EXAM: CT ANGIOGRAPHY HEAD TECHNIQUE: Multidetector CT imaging of the head was performed using the standard protocol during bolus administration of intravenous contrast. Multiplanar CT image reconstructions and MIPs were obtained to evaluate the vascular anatomy. CONTRAST:  75 cc of Isovue view 370. COMPARISON:  Prior CT from 08/15/2006. FINDINGS: CT HEAD Brain: Cerebral volume normal for age. Minimal chronic microvascular ischemic disease, also felt to be within normal limits for age. Remote lacunar infarct within the right  thalamus noted. Negative for acute intracranial hemorrhage. No acute infarct identified. No mass lesion, midline shift or mass effect. No hydrocephalus. No extra-axial fluid collection. Vascular: Prominent vascular calcifications are within the carotid siphons. Skull: Scalp soft tissues demonstrate no acute abnormality. Sinuses: Visualized paranasal sinuses are clear. No mastoid effusion. Orbits: Visualized globes and orbits within normal limits. CTA HEAD Anterior circulation: The distal cervical segments patent bilaterally. Petrous segments widely patent.  Scattered calcified atheromatous plaque within the cavernous/ supraclinoid ICAs with mild to moderate multi focal narrowing. Changes worse on the right. A1 segments patent. Left A1 segment is hypoplastic. Dominant right A2 segment which is widely patent to its distal aspect. Left A2 segment hypoplastic and small, with apparent occlusion (series 10, image 52). Atheromatous regularity throughout the anterior cerebral arteries. M1 segments patent without stenosis or occlusion. MCA bifurcations normal. Focal plaque at proximal M2 segment on the left with moderate stenosis (series 10, image 30). MCA branches opacified distally and symmetric. Atheromatous regularity throughout the MCA branches bilaterally. Posterior circulation: Attenuated flow within the vertebral arteries which are diminutive bilaterally. Left vertebral artery is dominant. Scattered atheromatous plaque within the V4 segments. There is fairly severe narrowing near the level of the vertebrobasilar junction (series 12, image 99). Basilar artery diminutive and attenuated distally with multi focal irregularity and moderate diffuse narrowing, but is patent to its distal aspect. Superior cerebral arteries patent bilaterally. There is fetal origin of the posterior cerebral arteries bilaterally, supplied via patent posterior communicating arteries. PCAs demonstrate multifocal atheromatous irregularity (worse on the left) but are supplied to their distal aspects. Venous sinuses: Grossly patent, although not well evaluated on this exam due to timing the contrast bolus. Anatomic variants: Fetal type PCAs with diminutive vertebrobasilar system. No aneurysm or vascular malformation. Delayed phase: No pathologic enhancement. IMPRESSION: 1. No acute intracranial process identified. 2. 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 Attenuated flow throughout the diminutive vertebrobasilar system with superimposed multifocal atheromatous  irregularity, with severe stenosis at the level of the vertebrobasilar junction. 3. Occlusion of the left A2 segment, of unknown chronicity. No CT findings to suggest evolving left ACA territory infarct, although evaluation with MRI would be warranted if there is concern for acute ischemia. 4. Additional age related atheromatous irregularity involving the anterior and posterior circulation as above. Electronically Signed   By: Jeannine Boga M.D.   On: 04/15/2016 01:31   Mr Angiogram Neck W Contrast  Result Date: 04/15/2016 CLINICAL DATA:  Stroke. Diabetes, hypertension, hyperlipidemia. Slurred speech EXAM: MR HEAD WITHOUT CONTRAST MR CIRCLE OF WILLIS WITHOUT CONTRAST MRA OF THE NECK WITHOUT AND WITH CONTRAST TECHNIQUE: Multiplanar, multiecho pulse sequences of the brain, circle of willis and surrounding structures were obtained without intravenous contrast. Angiographic images of the neck were obtained using MRA technique without and with intravenous contrast. CONTRAST:  61mL MULTIHANCE GADOBENATE DIMEGLUMINE 529 MG/ML IV SOLN COMPARISON:  CTA 04/15/2016 FINDINGS: MR HEAD FINDINGS Brain: Negative for acute infarct. Chronic microvascular ischemic change in the white matter. Chronic infarcts in the thalamus bilaterally. Brainstem and cerebellum intact. Negative for hemorrhage or mass. Pituitary normal in size. Vascular: Abnormal signal in the distal right vertebral artery. Flow voids in the remainder of the circle of Willis within normal limits. Skull and upper cervical spine: No acute skeletal abnormality. Degenerative changes C1-C2. Sinuses/Orbits: Negative Other: None MR CIRCLE OF WILLIS FINDINGS Severe stenosis distal right vertebral artery. Moderate stenosis distal left vertebral artery. Basilar is patent. AICA patent. Diffuse basilar atherosclerotic disease with mild stenosis.  Diffuse posterior cerebral artery atherosclerotic disease with moderate stenosis bilaterally. Fetal origin of the posterior  cerebral artery bilaterally. Superior cerebellar arteries patent bilaterally. Atherosclerotic irregularity in the cavernous carotid with mild stenosis bilaterally. Anterior and middle cerebral arteries patent bilaterally. Mild stenosis of the distal right M1 segment and right middle cerebral artery bifurcation. Left middle cerebral artery branches patent. Occlusion of the left anterior cerebral artery A2 segment. MRA NECK FINDINGS Severe stenosis at the origin of the vertebral artery bilaterally. Severe stenosis distal right vertebral artery. Moderate stenosis distal left vertebral artery. Carotid artery is patent bilaterally without significant stenosis. Mild atherosclerotic disease left carotid bulb. IMPRESSION: Negative for acute infarct. Chronic microvascular ischemic changes in the white matter and basal ganglia Severe stenosis at the origin of the vertebral artery bilaterally. Severe stenosis distal right vertebral artery and moderate stenosis distal left vertebral artery. Diffuse atherosclerotic disease in the basilar and posterior cerebral arteries bilaterally. Occlusion of the left anterior cerebral artery A2 segment. Mild atherosclerotic disease in the right middle cerebral artery. No significant carotid stenosis in the neck. Electronically Signed   By: Franchot Gallo M.D.   On: 04/15/2016 13:23   Mr Brain Wo Contrast  Result Date: 04/15/2016 CLINICAL DATA:  Stroke. Diabetes, hypertension, hyperlipidemia. Slurred speech EXAM: MR HEAD WITHOUT CONTRAST MR CIRCLE OF WILLIS WITHOUT CONTRAST MRA OF THE NECK WITHOUT AND WITH CONTRAST TECHNIQUE: Multiplanar, multiecho pulse sequences of the brain, circle of willis and surrounding structures were obtained without intravenous contrast. Angiographic images of the neck were obtained using MRA technique without and with intravenous contrast. CONTRAST:  55mL MULTIHANCE GADOBENATE DIMEGLUMINE 529 MG/ML IV SOLN COMPARISON:  CTA 04/15/2016 FINDINGS: MR HEAD FINDINGS  Brain: Negative for acute infarct. Chronic microvascular ischemic change in the white matter. Chronic infarcts in the thalamus bilaterally. Brainstem and cerebellum intact. Negative for hemorrhage or mass. Pituitary normal in size. Vascular: Abnormal signal in the distal right vertebral artery. Flow voids in the remainder of the circle of Willis within normal limits. Skull and upper cervical spine: No acute skeletal abnormality. Degenerative changes C1-C2. Sinuses/Orbits: Negative Other: None MR CIRCLE OF WILLIS FINDINGS Severe stenosis distal right vertebral artery. Moderate stenosis distal left vertebral artery. Basilar is patent. AICA patent. Diffuse basilar atherosclerotic disease with mild stenosis. Diffuse posterior cerebral artery atherosclerotic disease with moderate stenosis bilaterally. Fetal origin of the posterior cerebral artery bilaterally. Superior cerebellar arteries patent bilaterally. Atherosclerotic irregularity in the cavernous carotid with mild stenosis bilaterally. Anterior and middle cerebral arteries patent bilaterally. Mild stenosis of the distal right M1 segment and right middle cerebral artery bifurcation. Left middle cerebral artery branches patent. Occlusion of the left anterior cerebral artery A2 segment. MRA NECK FINDINGS Severe stenosis at the origin of the vertebral artery bilaterally. Severe stenosis distal right vertebral artery. Moderate stenosis distal left vertebral artery. Carotid artery is patent bilaterally without significant stenosis. Mild atherosclerotic disease left carotid bulb. IMPRESSION: Negative for acute infarct. Chronic microvascular ischemic changes in the white matter and basal ganglia Severe stenosis at the origin of the vertebral artery bilaterally. Severe stenosis distal right vertebral artery and moderate stenosis distal left vertebral artery. Diffuse atherosclerotic disease in the basilar and posterior cerebral arteries bilaterally. Occlusion of the left  anterior cerebral artery A2 segment. Mild atherosclerotic disease in the right middle cerebral artery. No significant carotid stenosis in the neck. Electronically Signed   By: Franchot Gallo M.D.   On: 04/15/2016 13:23   Mr Jodene Nam Head/brain [id number] Cm  Result Date: 04/15/2016 CLINICAL DATA:  Stroke. Diabetes, hypertension, hyperlipidemia. Slurred speech EXAM: MR HEAD WITHOUT CONTRAST MR CIRCLE OF WILLIS WITHOUT CONTRAST MRA OF THE NECK WITHOUT AND WITH CONTRAST TECHNIQUE: Multiplanar, multiecho pulse sequences of the brain, circle of willis and surrounding structures were obtained without intravenous contrast. Angiographic images of the neck were obtained using MRA technique without and with intravenous contrast. CONTRAST:  76mL MULTIHANCE GADOBENATE DIMEGLUMINE 529 MG/ML IV SOLN COMPARISON:  CTA 04/15/2016 FINDINGS: MR HEAD FINDINGS Brain: Negative for acute infarct. Chronic microvascular ischemic change in the white matter. Chronic infarcts in the thalamus bilaterally. Brainstem and cerebellum intact. Negative for hemorrhage or mass. Pituitary normal in size. Vascular: Abnormal signal in the distal right vertebral artery. Flow voids in the remainder of the circle of Willis within normal limits. Skull and upper cervical spine: No acute skeletal abnormality. Degenerative changes C1-C2. Sinuses/Orbits: Negative Other: None MR CIRCLE OF WILLIS FINDINGS Severe stenosis distal right vertebral artery. Moderate stenosis distal left vertebral artery. Basilar is patent. AICA patent. Diffuse basilar atherosclerotic disease with mild stenosis. Diffuse posterior cerebral artery atherosclerotic disease with moderate stenosis bilaterally. Fetal origin of the posterior cerebral artery bilaterally. Superior cerebellar arteries patent bilaterally. Atherosclerotic irregularity in the cavernous carotid with mild stenosis bilaterally. Anterior and middle cerebral arteries patent bilaterally. Mild stenosis of the distal right M1 segment  and right middle cerebral artery bifurcation. Left middle cerebral artery branches patent. Occlusion of the left anterior cerebral artery A2 segment. MRA NECK FINDINGS Severe stenosis at the origin of the vertebral artery bilaterally. Severe stenosis distal right vertebral artery. Moderate stenosis distal left vertebral artery. Carotid artery is patent bilaterally without significant stenosis. Mild atherosclerotic disease left carotid bulb. IMPRESSION: Negative for acute infarct. Chronic microvascular ischemic changes in the white matter and basal ganglia Severe stenosis at the origin of the vertebral artery bilaterally. Severe stenosis distal right vertebral artery and moderate stenosis distal left vertebral artery. Diffuse atherosclerotic disease in the basilar and posterior cerebral arteries bilaterally. Occlusion of the left anterior cerebral artery A2 segment. Mild atherosclerotic disease in the right middle cerebral artery. No significant carotid stenosis in the neck. Electronically Signed   By: Franchot Gallo M.D.   On: 04/15/2016 13:23    Medications:  I have reviewed the patient's current medications. Scheduled: . aspirin EC  81 mg Oral Daily  . clopidogrel  75 mg Oral Daily  . enoxaparin (LOVENOX) injection  40 mg Subcutaneous Q24H  . insulin aspart  0-9 Units Subcutaneous Q6H  . pantoprazole  40 mg Oral QAC breakfast  . simvastatin  20 mg Oral q1800    Assessment/Plan: Patient with continued visual complaints.  Based on examination suspect ischemic injury to the eye itself which also explains the MRI of the brain which has been reviewed and shows no acute changes.  Significant small vessel disease noted though on MRA and CTA.  Patient on ASA and Plavix.  Echocardiogram shows no cardiac source of emboli with an EF of 55%.  A1c 5.9, LDL 47.   Recommendations: 1.  Continue statin 2.  Outpatient follow up by ophthalmology and neurology    LOS: 0 days   Alexis Goodell,  MD Neurology [phone] 04/16/2016  11:31 AM

## 2016-04-16 NOTE — Progress Notes (Signed)
Pt discharged via pt's niece to [facility]. Report called to [facility]. Pt being discharged on room air, IV and tele removed, no distress noted. Ammie Dalton, RN

## 2016-04-16 NOTE — Progress Notes (Signed)
SLP Cancellation Note  Patient Details Name: Darrell Clark MRN: [id number] DOB: 1932/09/10   Cancelled treatment:       Reason Eval/Treat Not Completed: SLP screened, no needs identified, will sign off. Chart reviewed and Nursing consulted. Nursing notes no concern for swallowing deficits. Pt recently completed lunch meal when ST services entered Pt's room. Pt notes no swallowing deficits and no speech-language deficits. Pt states he is at his baseline for speech language abilities and at his baseline for swallowing abilities. ST services will be available for additional education and evaluation if decline in status during admission.    Rivka Safer, B.A. Clinical Graduate Student 04/16/2016, 12:23 PM

## 2016-04-16 NOTE — NC FL2 (Signed)
[place] LEVEL OF CARE SCREENING TOOL     IDENTIFICATION  Patient Name: Darrell Clark Birthdate: [birth year] Sex: male Admission Date (Current Location): 04/14/2016  [place] and [place] Number:  Engineering geologist and Address:  [facility], [address] [place], [place] [address]      Provider Number: [id number]  Attending Physician Name and Address:  Theodoro Grist, MD  Relative Name and Phone Number:       Current Level of Care: Hospital Recommended Level of Care: [place] Prior Approval Number:    Date Approved/Denied:   PASRR Number:   [id number]  Discharge Plan: SNF    Current Diagnoses: Patient Active Problem List   Diagnosis Date Noted  . Vision, loss, sudden 04/16/2016  . 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 Intracranial vascular stenosis 04/16/2016  . CVA (cerebral vascular accident) ([place]) 04/15/2016  . HTN (hypertension) 04/15/2016  . Diabetes ([place]) 04/15/2016  . GERD (gastroesophageal reflux disease) 04/15/2016  . HLD (hyperlipidemia) 04/15/2016    Orientation RESPIRATION BLADDER Height & Weight     Self, Time, Situation, Place  Normal Continent Weight: 225 lb (102.1 kg) Height:  5\' 11"  (180.3 cm)  BEHAVIORAL SYMPTOMS/MOOD NEUROLOGICAL BOWEL NUTRITION STATUS      Continent Diet (Low sodium heart healthy)  AMBULATORY STATUS COMMUNICATION OF NEEDS Skin   Limited Assist Verbally Normal                       Personal Care Assistance Level of Assistance  Bathing, Feeding, Dressing Bathing Assistance: Limited assistance Feeding assistance: Independent Dressing Assistance: Limited assistance     Functional Limitations Info  Sight, Hearing, Speech Sight Info: Impaired Hearing Info: Adequate Speech Info: Adequate    SPECIAL CARE FACTORS FREQUENCY  PT (By licensed PT), OT (By licensed OT)     PT Frequency: 5 OT Frequency: 5             Contractures Contractures Info: Not present    Additional Factors Info  Code Status, Allergies Code Status Info: DNR Allergies Info: Niacin, Niacin And Related           Current Medications (04/16/2016):  This is the current hospital active medication list Current Facility-Administered Medications  Medication Dose Route Frequency Provider Last Rate Last Dose  . acetaminophen (TYLENOL) tablet 650 mg  650 mg Oral Q6H PRN Lance Coon, MD   650 mg at 04/16/16 0630  . aspirin EC tablet 81 mg  81 mg Oral Daily Leotis Pain, MD      . clopidogrel (PLAVIX) tablet 75 mg  75 mg Oral Daily Leotis Pain, MD   75 mg at 04/15/16 1433  . enoxaparin (LOVENOX) injection 40 mg  40 mg Subcutaneous Q24H Lance Coon, MD   40 mg at 04/16/16 0434  . insulin aspart (novoLOG) injection 0-9 Units  0-9 Units Subcutaneous Q6H Lance Coon, MD   1 Units at 04/16/16 0434  . pantoprazole (PROTONIX) EC tablet 40 mg  40 mg Oral QAC breakfast Lance Coon, MD   40 mg at 04/16/16 0851  . simvastatin (ZOCOR) tablet 20 mg  20 mg Oral q1800 Lance Coon, MD   20 mg at 04/15/16 1803     Discharge Medications: Please see discharge summary for a list of discharge medications.  Relevant Imaging Results:  Relevant Lab Results:   Additional Information SSN:  [id number]  Lilly Cove, LCSW

## 2016-04-16 NOTE — Clinical Social Work Placement (Addendum)
   CLINICAL SOCIAL WORK PLACEMENT  NOTE  Date:  04/16/2016  Patient Details  Name: Darrell Clark MRN: [id number] Date of Birth: Sep 25, 1932  Clinical Social Work is seeking post-discharge placement for this patient at the [place] level of care (*CSW will initial, date and re-position this form in  chart as items are completed):  Yes   Patient/family provided with [place] Work Department's list of facilities offering this level of care within the geographic area requested by the patient (or if unable, by the patient's family).  Yes   Patient/family informed of their freedom to choose among providers that offer the needed level of care, that participate in Medicare, Medicaid or managed care program needed by the patient, have an available bed and are willing to accept the patient.  Yes   Patient/family informed of [facility]'s ownership interest in [facility] and [facility], as well as of the fact that they are under no obligation to receive care at these facilities.  PASRR submitted to EDS on 04/16/16     PASRR number received on 04/16/16     Existing PASRR number confirmed on       FL2 transmitted to all facilities in geographic area requested by pt/family on     04/16/2016   FL2 transmitted to all facilities within larger geographic area on       Patient informed that his/her managed care company has contracts with or will negotiate with certain facilities, including the following:      04/16/2016       Patient/family informed of bed offers received. 04/16/2016   Patient chooses bed at     [facility]  Physician recommends and patient chooses bed at     SNF Patient to be transferred to   on  .  04/16/2016   Patient to be transferred to facility by      family  Patient family notified on   of transfer.  Juliann Pulse  Name of family member notified:        Patient calling family for transport.  PHYSICIAN Please sign FL2      Additional Comment:    _______________________________________________ Lilly Cove, LCSW 04/16/2016, 10:59 AM

## 2016-04-16 NOTE — Clinical Social Work Placement (Signed)
   CLINICAL SOCIAL WORK PLACEMENT  NOTE  Date:  04/16/2016  Patient Details  Name: Darrell Clark MRN: [id number] Date of Birth: October 03, 1932  Clinical Social Work is seeking post-discharge placement for this patient at the [place] level of care (*CSW will initial, date and re-position this form in  chart as items are completed):  Yes   Patient/family provided with [place] Work Department's list of facilities offering this level of care within the geographic area requested by the patient (or if unable, by the patient's family).  Yes   Patient/family informed of their freedom to choose among providers that offer the needed level of care, that participate in Medicare, Medicaid or managed care program needed by the patient, have an available bed and are willing to accept the patient.  Yes   Patient/family informed of [facility]'s ownership interest in [facility] and [facility], as well as of the fact that they are under no obligation to receive care at these facilities.  PASRR submitted to EDS on 04/16/16     PASRR number received on 04/16/16     Existing PASRR number confirmed on       FL2 transmitted to all facilities in geographic area requested by pt/family on       FL2 transmitted to all facilities within larger geographic area on       Patient informed that his/her managed care company has contracts with or will negotiate with certain facilities, including the following:        Yes   Patient/family informed of bed offers received.  Patient [facility] bed at [facility]     Physician recommends and patient chooses bed at [facility]    Patient to be transferred to [facility] on 04/16/16.  Patient to be transferred to facility by family      Patient family notified on 04/16/16 of transfer.  Name of family member notified:  neice     PHYSICIAN Please sign FL2     Additional Comment:     _______________________________________________ Lilly Cove, LCSW 04/16/2016, 11:13 AM

## 2016-05-03 ENCOUNTER — Other Ambulatory Visit: Payer: Self-pay | Admitting: *Deleted

## 2016-05-03 NOTE — Patient Outreach (Signed)
Attempt made to contact pt, follow up on [facility] referral received 11/15 - pt's  recent hospital discharge 10/28-10/30 vision issues, CVA and SNF discharge 10/30-11/12.    HIPAA compliant voice message left with contact name and number.     Plan:  If no response, plan to follow up again tomorrow telephonically- transition of care.     Zara Chess.   [place] Care Management  [phone]

## 2016-05-03 NOTE — Patient Outreach (Signed)
[facility]) Care Management  05/03/2016  Darrell Clark [birth year] [id number]   Patient referred to this social worker by [facility] to assist with transportation needs, assess for support needs ans well as food resources.  Patient contacted by phone, HIPAA complaint voicemail message left requesting a return call.   [name] [facility] Care Management [phone]

## 2016-05-04 ENCOUNTER — Ambulatory Visit: Payer: Self-pay | Admitting: *Deleted

## 2016-05-04 ENCOUNTER — Encounter: Payer: Self-pay | Admitting: *Deleted

## 2016-05-04 ENCOUNTER — Other Ambulatory Visit: Payer: Self-pay | Admitting: *Deleted

## 2016-05-04 NOTE — Patient Outreach (Signed)
Follow up call to pt successful, complete transition of care with pt.   Spoke with pt, HIPAA verified.   Pt reports can't talk long as someone is making him something to eat.  Pt reports does not know what problems to call MD about to which RN CM discussed  signs/symptoms of stroke, when to call 911.   Pt reports he is to see Eye MD in December.   Pt did not know if on a special diet (unable to read discharge instructions, vision loss from CVA).    Plan:  RN CM to follow up with pt again next week- initial home visit.    Zara Chess.   [place] Care Management  [phone]

## 2016-05-04 NOTE — Patient Outreach (Addendum)
Received a return phone call from pt to voice message left yesterday by RN CM.   Spoke with pt, HIPAA verified, discussed purpose of attempt call yesterday, follow up on [facility] referral- recent SNF discharge 10/31-11/12,recent  Hospitalization 10/28-10/30 vision changes, CVA.   Pt reports he saw Dr. Brynda Greathouse yesterday, was told not sure if be able to see from left eye again. Pt reports he is currently not driving, son took him to MD office visit.   Pt reports he has his discharge instructions somewhere, cannot find them now.  Pt reports a  nurse came out, went over his medications.   Pt reports son set his medications up for him but son will not be able to continue to do it- works, a Administrator.   Pt reports he has no food, goes out to eat- can't cook- tried and burnt the food. RN CM discussed with pt Meals on Wheels, that [facility] LCSW will be following up with him also - community resources.   RN CM discussed doing a home visit to which pt gave verbal consent to receive [facility] services.      Plan:  As discussed with pt, plan to call him back later today, complete transition of care.             Plan to do a home visit next week- part of transition of care.            Barrier letter sent to Dr. Brynda Greathouse- inform MD of [facility] involvement.     Zara Chess.   [place] Care Management  [phone]

## 2016-05-07 ENCOUNTER — Encounter: Payer: Self-pay | Admitting: *Deleted

## 2016-05-07 ENCOUNTER — Other Ambulatory Visit: Payer: Self-pay | Admitting: *Deleted

## 2016-05-07 NOTE — Patient Outreach (Signed)
[facility]) Care Management  [facility] Social Work  05/07/2016  Darrell Clark 1933/05/01 [id number]  Subjective:  Patient is a 82 year old male, currently resides in his own home.  Patient's spouse is currently in rehab at  [facility].  Objective:   Encounter Medications:  Outpatient Encounter Prescriptions as of 05/07/2016  Medication Sig  . amLODipine (NORVASC) 5 MG tablet Take 5 mg by mouth daily.   Marland Kitchen aspirin EC 81 MG EC tablet Take 1 tablet (81 mg total) by mouth daily.  . clopidogrel (PLAVIX) 75 MG tablet Take 1 tablet (75 mg total) by mouth daily.  Marland Kitchen donepezil (ARICEPT) 10 MG tablet Take 10 mg by mouth at bedtime.   . fluticasone (FLONASE) 50 MCG/ACT nasal spray Place 2 sprays into both nostrils daily.   Marland Kitchen glipiZIDE (GLUCOTROL XL) 2.5 MG 24 hr tablet Take 1 tablet (2.5 mg total) by mouth daily with breakfast.  . NEOMYCIN-POLYMYXIN-HYDROCORTISONE (CORTISPORIN) 1 % SOLN otic solution Apply 1-2 drops to toe BID after soaking  . ONE TOUCH ULTRA TEST test strip   . ONETOUCH DELICA LANCETS 99991111 MISC   . pantoprazole (PROTONIX) 40 MG tablet Take 40 mg by mouth daily.   . simvastatin (ZOCOR) 20 MG tablet Take 20 mg by mouth daily at 6 PM.    No facility-administered encounter medications on file as of 05/07/2016.     Functional Status:  In your present state of health, do you have any difficulty performing the following activities: 05/07/2016 04/15/2016  Hearing? N N  Vision? Y N  Difficulty concentrating or making decisions? N N  Walking or climbing stairs? Y Y  Dressing or bathing? N N  Doing errands, shopping? [name]  Preparing Food and eating ? Y -  Using the Toilet? N -  In the past six months, have you accidently leaked urine? N -  Do you have problems with loss of bowel control? N -  Managing your Medications? Y -  Managing your Finances? Y -  Housekeeping or managing your Housekeeping? Y -  Some recent data might be hidden    Fall/Depression  Screening:  PHQ 2/9 Scores 05/07/2016  PHQ - 2 Score 6  PHQ- 9 Score 12    Assessment: Patient was referred to this social worker to assess for transportation needs, assess for support needs as well as food resources. Per patient, his wife took care of a lot of the household duties, however she is now in rehab at [facility] and will likely remain there under long term care.  per patient "even if she did come home I would not be able to take care of her"  Patient very tearful stating that she cooked the meals, took care of the home as well as coordinated all of his medical  appointments and medicines. Per patient, he is having increased difficulty  having to ask for help.  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  Per patient, he drives daily to see his wife and to take her meals. For longer distances, he usually calls a family friend or church member.  Patient states that a church member will be taking him today  to the bank and to get a walker from [place]. Patient observed to have a unsteady gait, and walks holding on to various furniture in the home.  PT called from [place] during the visit and will be calling patient in 05/08/16 schedule  an appointment to assess appointment for home health services.  Patient's family support explored. Per patient, he has a son that lives local and a limited amount of family friends and church family, however per patient "he does not want to burden them".    This social provided patient with supportive counseling around asking for and accepting help. Discussed the importance of safety and limiting his driving due to eyesight impairment.  This Education officer, museum did not refer patient to Meals on Wheels as he is is not home bound.  Plan:  Follow up with patient on 05/21/16 at 9:30am to discuss ways to expand patient's support system.  Community resources to be provided as well.

## 2016-05-08 ENCOUNTER — Encounter: Payer: Self-pay | Admitting: *Deleted

## 2016-05-08 ENCOUNTER — Other Ambulatory Visit: Payer: Self-pay | Admitting: *Deleted

## 2016-05-08 NOTE — Patient Outreach (Signed)
[facility]) Care Management   05/08/2016  Brandyn TIBERIUS ROMUALDO 04-15-1933 [id number]  Nezar KALIL BONFANTE is an 82 y.o. male  Subjective:   Pt reports he still has the bubble packs from [facility], son helps with medications. Pt reports he skipped  2 days of his BP medication, does not know how that happened.  Pt reports saw  Eye MD, told he could drive, but only does if necessary ([place], visit spouse at [facility]).  Pt  reports checks his sugars daily.    Objective:   Vitals:   05/08/16 1032  BP: 130/90  Pulse: 66  Resp: 16    ROS  Physical Exam  Constitutional: He is oriented to person, place, and time. He appears well-developed and well-nourished.  Cardiovascular: Normal rate, regular rhythm and normal heart sounds.   Respiratory: Effort normal and breath sounds normal.  GI: Soft. Bowel sounds are normal.  Musculoskeletal: Normal range of motion.  Neurological: He is alert and oriented to person, place, and time.  Skin: Skin is warm and dry.  Psychiatric: He has a normal mood and affect. His behavior is normal. Judgment and thought content normal.    Encounter Medications:   Outpatient Encounter Prescriptions as of 05/08/2016  Medication Sig  . amLODipine (NORVASC) 5 MG tablet Take 5 mg by mouth daily.   Marland Kitchen aspirin EC 81 MG EC tablet Take 1 tablet (81 mg total) by mouth daily.  . clopidogrel (PLAVIX) 75 MG tablet Take 1 tablet (75 mg total) by mouth daily.  Marland Kitchen donepezil (ARICEPT) 10 MG tablet Take 10 mg by mouth at bedtime.   . fluticasone (FLONASE) 50 MCG/ACT nasal spray Place 2 sprays into both nostrils daily.   Marland Kitchen glipiZIDE (GLUCOTROL XL) 2.5 MG 24 hr tablet Take 1 tablet (2.5 mg total) by mouth daily with breakfast.  . NEOMYCIN-POLYMYXIN-HYDROCORTISONE (CORTISPORIN) 1 % SOLN otic solution Apply 1-2 drops to toe BID after soaking  . ONE TOUCH ULTRA TEST test strip   . ONETOUCH DELICA LANCETS 99991111 MISC   . pantoprazole (PROTONIX) 40 MG tablet  Take 40 mg by mouth daily.   . simvastatin (ZOCOR) 20 MG tablet Take 20 mg by mouth daily at 6 PM.    No facility-administered encounter medications on file as of 05/08/2016.     Functional Status:   In your present state of health, do you have any difficulty performing the following activities: 05/07/2016 04/15/2016  Hearing? N N  Vision? Y N  Difficulty concentrating or making decisions? N N  Walking or climbing stairs? Y Y  Dressing or bathing? N N  Doing errands, shopping? [name]  Preparing Food and eating ? Y -  Using the Toilet? N -  In the past six months, have you accidently leaked urine? N -  Do you have problems with loss of bowel control? N -  Managing your Medications? Y -  Managing your Finances? Y -  Housekeeping or managing your Housekeeping? Y -  Some recent data might be hidden    Fall/Depression Screening:    PHQ 2/9 Scores 05/07/2016  PHQ - 2 Score 6  PHQ- 9 Score 12    Assessment:  Pleasant 82 year old gentleman, currently lives alone as spouse in SNF.   Lungs clear, No c/o sob, pain.  HH PT came during home visit, opening pt up for services, determined pt is home  Bound.  Recent CVA-  Reviewed with pt signs/symptoms of stroke- preventing a second stroke.            Hypertension- BP today 130/90 (prior to taking medications). Provided pt with information on                              Low Na+ diet- discussed  foods to avoid, reading food labels for sodium content.              Medication adherence- per pt skipped 2 days of not taking BP medication, does not know how.                  Provided pt with weekly pill planner to use after completing bubble packs from SNF.              DM- view of pt's glucometer, sugar today 130, averages 7 day-133, 14 day 135, 30 day 131, ranges                111-152.   Plan:  Plan to continue to follow pt for transition of care, follow up again next week telephonically.            Plan to fax Dr. Brynda Greathouse 11/21 home  visit encounter.  THN CM Care Plan Problem One   Flowsheet Row Most Recent Value  Care Plan Problem One  Risk for readmission related to recent SNF and hospitali discharge (CVA).   Role Documenting the Problem One  Care Management Coordinator  Care Plan for Problem One  Active  THN Long Term Goal (31-90 days)  Pt would not readmit to SNF or hospital within the next 31 days   THN Long Term Goal Start Date  05/04/16  Interventions for Problem One Long Term Goal  Initial home visit done, provided/reviewed information on Low Na+ diet (foods to avoid, reading food labels for sodium content).   THN CM Short Term Goal #1 (0-30 days)  Pt will have no issues taking his medications in the next 30 days   THN CM Short Term Goal #1 Start Date  05/04/16  Interventions for Short Term Goal #1  discussed medication adherence as pt reported missed 2 days of BP medication, RN CM provided weekly pill planner   THN CM Short Term Goal #2 (0-30 days)  Pt will keep all MD appointments in the next 30 days   THN CM Short Term Goal #2 Start Date  05/04/16     Zara Chess.   [place] Care Management  [phone]

## 2016-05-13 ENCOUNTER — Emergency Department: Payer: PPO

## 2016-05-13 ENCOUNTER — Emergency Department
Admission: EM | Admit: 2016-05-13 | Discharge: 2016-05-13 | Disposition: A | Discharge status: Home / Self Care | Payer: PPO | Attending: Emergency Medicine | Admitting: Emergency Medicine

## 2016-05-13 ENCOUNTER — Encounter: Payer: Self-pay | Admitting: Emergency Medicine

## 2016-05-13 DIAGNOSIS — I2510 Atherosclerotic heart disease of native coronary artery without angina pectoris: Secondary | ICD-10-CM | POA: Insufficient documentation

## 2016-05-13 DIAGNOSIS — Z87891 Personal history of nicotine dependence: Secondary | ICD-10-CM | POA: Diagnosis not present

## 2016-05-13 DIAGNOSIS — Z79899 Other long term (current) drug therapy: Secondary | ICD-10-CM | POA: Insufficient documentation

## 2016-05-13 DIAGNOSIS — I10 Essential (primary) hypertension: Secondary | ICD-10-CM | POA: Insufficient documentation

## 2016-05-13 DIAGNOSIS — Z7984 Long term (current) use of oral hypoglycemic drugs: Secondary | ICD-10-CM | POA: Diagnosis not present

## 2016-05-13 DIAGNOSIS — R208 Other disturbances of skin sensation: Secondary | ICD-10-CM

## 2016-05-13 DIAGNOSIS — R0602 Shortness of breath: Secondary | ICD-10-CM | POA: Diagnosis not present

## 2016-05-13 DIAGNOSIS — R079 Chest pain, unspecified: Secondary | ICD-10-CM | POA: Diagnosis not present

## 2016-05-13 DIAGNOSIS — E119 Type 2 diabetes mellitus without complications: Secondary | ICD-10-CM | POA: Diagnosis not present

## 2016-05-13 DIAGNOSIS — Z7982 Long term (current) use of aspirin: Secondary | ICD-10-CM | POA: Diagnosis not present

## 2016-05-13 DIAGNOSIS — R51 Headache: Secondary | ICD-10-CM | POA: Diagnosis present

## 2016-05-13 DIAGNOSIS — Z8546 Personal history of malignant neoplasm of prostate: Secondary | ICD-10-CM | POA: Insufficient documentation

## 2016-05-13 HISTORY — DX: Cerebral infarction, unspecified: I63.9

## 2016-05-13 LAB — URINALYSIS COMPLETE WITH MICROSCOPIC (ARMC ONLY)
BILIRUBIN URINE: NEGATIVE
Bacteria, UA: NONE SEEN
GLUCOSE, UA: NEGATIVE mg/dL
Hgb urine dipstick: NEGATIVE
KETONES UR: NEGATIVE mg/dL
Leukocytes, UA: NEGATIVE
NITRITE: NEGATIVE
PH: 5 (ref 5.0–8.0)
Protein, ur: 30 mg/dL — AB
Specific Gravity, Urine: 1.018 (ref 1.005–1.030)
Squamous Epithelial / LPF: NONE SEEN

## 2016-05-13 LAB — BASIC METABOLIC PANEL
ANION GAP: 8 (ref 5–15)
BUN: 16 mg/dL (ref 6–20)
CALCIUM: 9.3 mg/dL (ref 8.9–10.3)
CO2: 23 mmol/L (ref 22–32)
CREATININE: 0.94 mg/dL (ref 0.61–1.24)
Chloride: 107 mmol/L (ref 101–111)
Glucose, Bld: 142 mg/dL — ABNORMAL HIGH (ref 65–99)
Potassium: 3.5 mmol/L (ref 3.5–5.1)
SODIUM: 138 mmol/L (ref 135–145)

## 2016-05-13 LAB — CBC
HEMATOCRIT: 49.8 % (ref 40.0–52.0)
HEMOGLOBIN: 17.2 g/dL (ref 13.0–18.0)
MCH: 32.7 pg (ref 26.0–34.0)
MCHC: 34.5 g/dL (ref 32.0–36.0)
MCV: 94.7 fL (ref 80.0–100.0)
Platelets: 167 10*3/uL (ref 150–440)
RBC: 5.26 MIL/uL (ref 4.40–5.90)
RDW: 13.8 % (ref 11.5–14.5)
WBC: 7.1 10*3/uL (ref 3.8–10.6)

## 2016-05-13 LAB — TROPONIN I

## 2016-05-13 MED ORDER — DIAZEPAM 5 MG PO TABS
5.0000 mg | ORAL_TABLET | Freq: Once | ORAL | Status: AC
  Administered 2016-05-13: 5 mg via ORAL
  Filled 2016-05-13: qty 1

## 2016-05-13 MED ORDER — ONDANSETRON HCL 4 MG PO TABS: 4.0000 mg | ORAL_TABLET | Freq: Every day | ORAL | 0 refills | Status: AC | PRN

## 2016-05-13 MED ORDER — DIAZEPAM 2 MG PO TABS: 2.0000 mg | ORAL_TABLET | Freq: Once | ORAL | Status: DC

## 2016-05-13 NOTE — ED Provider Notes (Signed)
Patient received in signout from Dr. Dahlia Client. Workup pending repeat troponin. Repeat troponin is negative. Patient remains asymptomatic in no acute distress. Patient stable for outpatient follow-up.   Merlyn Lot, MD 05/13/16 [phone]

## 2016-05-13 NOTE — ED Notes (Signed)
Pt. Had stroke two weeks ago that affected lt. Eye.  Pt. Has limited vision to lt. Eye.  Pt. States tonight he had a buzzing feeling in head.  Pt. States that feeling went away.  Pt. States he was afraid to go back to sleep.  Pt. Stated he called his PCP, and was told to come to ED.

## 2016-05-13 NOTE — ED Provider Notes (Signed)
[facility] Emergency Department Provider Note   ____________________________________________   First MD Initiated Contact with Patient 05/13/16 [phone]     (approximate)  I have reviewed the triage vital signs and the nursing notes.   HISTORY  Chief Complaint Anxiety and Headache (Pt. states buzzing feeling in head tonight around midnight.)    HPI Darrell Clark is a 82 y.o. male who comes into the hospital today feeling buzzing in his head. He reports that he was scared to go to sleep. The symptoms started around midnight. He reports that he drinks some V8 juice to help him relax when he laid down he felt a buzzing again. The patient reports that he was scared but he denies any pain outside of normal. He reports that he has a history of back pain and groin pain. He's had occasional pain in his head but nothing with this buzzing. The patient can't see very well out of his left side that he had a stroke 3-4 weeks ago and reports that this is a result of his stroke. The patient had some mild occasional chest pain. He reports that he does get scared. He denies any vomiting or nausea the patient is here for evaluation today.   Past Medical History:  Diagnosis Date  . Allergy   . Anxiety   . Arthritis   . CAD (coronary artery disease)   . Diabetes mellitus without complication ([place])   . GERD (gastroesophageal reflux disease)   . HLD (hyperlipidemia)   . Hypertension   . Prostate cancer ([place])   . Stroke [facility])     Patient Active Problem List   Diagnosis Date Noted  . Vision, loss, sudden 04/16/2016  . 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 Intracranial vascular stenosis 04/16/2016  . CVA (cerebral vascular accident) ([place]) 04/15/2016  . HTN (hypertension) 04/15/2016  . Diabetes ([place]) 04/15/2016  . GERD (gastroesophageal reflux disease) 04/15/2016  . HLD (hyperlipidemia) 04/15/2016    Past Surgical  History:  Procedure Laterality Date  . APPENDECTOMY      Prior to Admission medications   Medication Sig Start Date End Date Taking? Authorizing Provider  Acetaminophen 500 MG coapsule Take by mouth. Pt takes one capsule twice a day, am/pm    Historical Provider, MD  amLODipine (NORVASC) 5 MG tablet Take 5 mg by mouth daily.  02/21/13   Historical Provider, MD  aspirin EC 81 MG EC tablet Take 1 tablet (81 mg total) by mouth daily. 04/16/16   Theodoro Grist, MD  clopidogrel (PLAVIX) 75 MG tablet Take 1 tablet (75 mg total) by mouth daily. 04/16/16   Theodoro Grist, MD  donepezil (ARICEPT) 10 MG tablet Take 10 mg by mouth at bedtime.  02/21/13   Historical Provider, MD  fluticasone (FLONASE) 50 MCG/ACT nasal spray Place 2 sprays into both nostrils daily.  04/16/13   Historical Provider, MD  glipiZIDE (GLUCOTROL XL) 2.5 MG 24 hr tablet Take 1 tablet (2.5 mg total) by mouth daily with breakfast. 04/16/16   Theodoro Grist, MD  Misc Natural Products (OSTEO BI-FLEX/5-LOXIN ADVANCED PO) Take by mouth. Pt takes 2 tablets daily    Historical Provider, MD  NEOMYCIN-POLYMYXIN-HYDROCORTISONE (CORTISPORIN) 1 % SOLN otic solution Apply 1-2 drops to toe BID after soaking Patient not taking: Reported on 05/08/2016 03/26/16   Max T Hyatt, DPM  ONE TOUCH ULTRA TEST test strip  04/16/13   Historical Provider, MD  Jonetta Speak LANCETS 99991111 MISC  04/16/13   Historical Provider, MD  pantoprazole (PROTONIX) 40 MG tablet Take 40 mg by mouth daily.  08/06/13   Historical Provider, MD  simvastatin (ZOCOR) 20 MG tablet Take 20 mg by mouth daily at 6 PM.  03/09/13   Historical Provider, MD    Allergies Niacin and Niacin and related  Family History  Problem Relation Age of Onset  . Heart attack Mother   . Hypertension Mother   . Heart attack Father   . Hypertension Father   . Hypertension Sister   . Cancer Brother   . Hypertension Brother     Social History Social History  Substance Use Topics  . Smoking status:  Former Research scientist (life sciences)  . Smokeless tobacco: Never Used     Comment: quit 10 years ago   . Alcohol use No    Review of Systems Constitutional: No fever/chills Eyes: No visual changes. ENT: No sore throat. Cardiovascular:  chest pain. Respiratory:  shortness of breath. Gastrointestinal: No abdominal pain.  No nausea, no vomiting.  No diarrhea.  No constipation. Genitourinary: Negative for dysuria. Musculoskeletal: Negative for back pain. Skin: Negative for rash. Neurological: headache, "buzzing in head"  10-point ROS otherwise negative.  ____________________________________________   PHYSICAL EXAM:  VITAL SIGNS: ED Triage Vitals  Enc Vitals Group     BP 05/13/16 0342 (!) 160/94     Pulse Rate 05/13/16 0342 72     Resp 05/13/16 0342 (!) 22     Temp 05/13/16 0342 97.9 F (36.6 C)     Temp Source 05/13/16 0342 Oral     SpO2 05/13/16 0342 97 %     Weight 05/13/16 0343 220 lb (99.8 kg)     Height 05/13/16 0343 5\' 11"  (1.803 m)     Head Circumference --      [facility] Flow --      Pain Score 05/13/16 0344 0     Pain Loc --      Pain Edu? --      Excl. in [place]? --     Constitutional: Alert and oriented. Well appearing and in no acute distress. Eyes: Conjunctivae are normal. PERRL. EOMI. Head: Atraumatic. Nose: No congestion/rhinnorhea. Mouth/Throat: Mucous membranes are moist.  Oropharynx non-erythematous. Cardiovascular: Normal rate, regular rhythm. Systolic, humming murmur.  Good peripheral circulation. Respiratory: Normal respiratory effort.  No retractions. Lungs CTAB. Gastrointestinal: Soft and nontender. No distention. Positive bowel sounds Musculoskeletal: No lower extremity tenderness nor edema.   Neurologic:  Normal speech and language.  Skin:  Skin is warm, dry and intact.  Psychiatric: Mood and affect are normal.   ____________________________________________   LABS (all labs ordered are listed, but only abnormal results are displayed)  Labs Reviewed  BASIC  METABOLIC PANEL - Abnormal; Notable for the following:       Result Value   Glucose, Bld 142 (*)    All other components within normal limits  URINALYSIS COMPLETEWITH MICROSCOPIC ([facility] ONLY) - Abnormal; Notable for the following:    Color, Urine YELLOW (*)    APPearance CLEAR (*)    Protein, ur 30 (*)    All other components within normal limits  TROPONIN I  CBC  TROPONIN I   ____________________________________________  EKG  ED ECG REPORT I, Loney Hering, the attending physician, personally viewed and interpreted this ECG.   Date: 05/13/2016  EKG Time: 520  Rate: 68  Rhythm: normal sinus rhythm  Axis: left axis deviation  Intervals:none  ST&T Change: none  ____________________________________________  RADIOLOGY  CXR CT head ____________________________________________   PROCEDURES  Procedure(s) performed: None  Procedures  Critical Care performed: No  ____________________________________________   INITIAL IMPRESSION / ASSESSMENT AND PLAN / ED COURSE  Pertinent labs & imaging results that were available during my care of the patient were reviewed by me and considered in my medical decision making (see chart for details).  This is an 82 year old male who comes into the hospital today with some vague symptoms. He is reporting that he does have some buzzing in his head which made him scared. The patient has had a recent stroke but is also having some chest pain and shortness of breath. I will check some blood work to evaluate for possible acute coronary syndrome. The patient is having no symptoms at this time we will continue to monitor the patient. I will send the patient for CT of his head and a chest x-ray. He'll be reassessed.  Clinical Course as of May 13 841  Sun May 13, 2016  0529 1. No acute intracranial abnormality. 2. Stable chronic microvascular ischemic changes and parenchymal volume loss of the brain.   CT Head Wo Contrast [AW]      Clinical Course User Index [AW] Loney Hering, MD   The patient does have a murmur but he reports that he's had this before. The patient's CT head is unremarkable. The patient's care was sent out to Dr. Quentin Cornwall who will follow-up the results of the repeat troponin. The patient continues to have no symptoms at this time.  ____________________________________________   FINAL CLINICAL IMPRESSION(S) / ED DIAGNOSES  Final diagnoses:  Dysesthesia      NEW MEDICATIONS STARTED DURING THIS VISIT:  New Prescriptions   No medications on file     Note:  This document was prepared using Dragon voice recognition software and may include unintentional dictation errors.    Loney Hering, MD 05/13/16 [phone]

## 2016-05-13 NOTE — ED Triage Notes (Signed)
Pt. States around midnight tonight he felt buzzing in his head that passed.  Pt. Stated it made him nervous because family members were out of town.  Pt. States he has been unable to go back to sleep tonight.

## 2016-05-13 NOTE — ED Notes (Signed)
Patient transported to X-ray 

## 2016-05-13 NOTE — Discharge Instructions (Signed)
Return to ER for any increase in pain or discomfort , if the pain changes or becomes worse with physical activity, you have shortness of breath, nausea or vomiting.  Follow up with your PCP.

## 2016-05-15 ENCOUNTER — Other Ambulatory Visit: Payer: Self-pay | Admitting: *Deleted

## 2016-05-15 NOTE — Patient Outreach (Signed)
Transition of care call successful, ongoing follow up on [facility]  Referral for  recent SNF discharge 10/31-11/12, recent hospitalization 10/28-10/30 vision changes, CVA.   Also call today is to follow up on pt's  recent ED visit 11/26 (anxiety, headache).  Merry Proud [facility] PT ([facility]) answered the phone, gave phone to pt.   Spoke with pt, HIPAA verified.   Pt reports on recent ED visit-  Since discharge better, no more buzzing in head. Pt reports taking anti anxiety medication - helping, was not taking medication before.  Pt reports continues with left leg/groin pain.  Pt reports has an appointment to see Primary Care MD 12/13.    Plan:  As discussed with pt, will follow up again next week telephonically (part of ongoing transition of care).    Zara Chess.   [place] Care Management  [phone]

## 2016-05-22 ENCOUNTER — Other Ambulatory Visit: Payer: Self-pay | Admitting: *Deleted

## 2016-05-22 ENCOUNTER — Ambulatory Visit: Payer: Self-pay | Admitting: *Deleted

## 2016-05-22 NOTE — Patient Outreach (Signed)
Attempt made to contact pt on his home phone as  part of ongoing transition of care- recent SNF discharge 10/31-11/12, recent hospitalization 10/28-10/30 vision changes, CVA.   HIPAA compliant voice message left with contact name and number.   Plan:  RN CM to call pt's mobile number.     Zara Chess.   [place] Care Management  [phone]

## 2016-05-22 NOTE — Patient Outreach (Addendum)
Transition of care call completed- successful in contacting pt on his mobile phone.   Ongoing follow up on recent SNF discharge 10/31-11/12, recent hospitalization 10/28-10/30 vision changes,CVA.   Spoke with pt, HIPAA verified (name, dob, address).   Pt reports taking all of his medications, has not missed any, son been helping with  pill planner- filling once a week.  Pt reports concerned when son goes out of town.  RN CM suggested son fill  pill planners for two weeks to which pt reports right now it is working but suggest that if needed.  Pt reports HHPT still coming.   Pt reports difficulty with adhering to Low Na+ diet to which RN CM encouraged pt to avoid processed foods (high in sodium).  Pt reports BP was low yesterday, checked by Chrystal  told to drink water and it did come up, Chrystal to come back 12/8.  Pt reports saw Dr. Brynda Greathouse yesterday, MD to have him stay with medication changes made by [facility].     Plan:  As discussed with pt, plan to follow up again next week telephonically as part of ongoing transition of care.    Zara Chess.   [facility]  [phone]

## 2016-05-22 NOTE — Patient Outreach (Signed)
Darrell Clark) Care Clark  [facility] Social Work  05/22/2016  Darrell Clark June 11, 1933 [id number]  Subjective:  Patient is a 82 year old male. Per pateint Patient states that he continues to feel sad and depressed. States that he went to the emergency room and was given xanax to help with his anxiety. Patient states that he is open to in home mental health therapyPatient states that he continues to  receive PT and nursing from Darrell Clark. Patient  Has identified his son as the person who will fill his pill box and has committed  to doing this weekly.  Patient states he is not driving at night, and has identified specific  family and friends to  take him to see his wife who is in long term care, to  medical appointments, and to run errands.  Patient currently has [employer] which includes a camera system for safety and a emergency button that he wears around his neck. .  Objective:   Encounter Medications:  Outpatient Encounter Prescriptions as of 05/22/2016  Medication Sig  . Acetaminophen 500 MG coapsule Take by mouth. Pt takes one capsule twice a Clark, am/pm  . amLODipine (NORVASC) 5 MG tablet Take 5 mg by mouth daily.   Marland Kitchen aspirin EC 81 MG EC tablet Take 1 tablet (81 mg total) by mouth daily.  . clopidogrel (PLAVIX) 75 MG tablet Take 1 tablet (75 mg total) by mouth daily.  Marland Kitchen donepezil (ARICEPT) 10 MG tablet Take 10 mg by mouth at bedtime.   . fluticasone (FLONASE) 50 MCG/ACT nasal spray Place 2 sprays into both nostrils daily.   Marland Kitchen glipiZIDE (GLUCOTROL XL) 2.5 MG 24 hr tablet Take 1 tablet (2.5 mg total) by mouth daily with breakfast.  . Misc Natural Products (OSTEO BI-FLEX/5-LOXIN ADVANCED PO) Take by mouth. Pt takes 2 tablets daily  . NEOMYCIN-POLYMYXIN-HYDROCORTISONE (CORTISPORIN) 1 % SOLN otic solution Apply 1-2 drops to toe BID after soaking (Patient not taking: Reported on 05/08/2016)  . ONE TOUCH ULTRA TEST test strip   . ONETOUCH DELICA LANCETS 99991111 MISC   .  pantoprazole (PROTONIX) 40 MG tablet Take 40 mg by mouth daily.   . simvastatin (ZOCOR) 20 MG tablet Take 20 mg by mouth daily at 6 PM.    No facility-administered encounter medications on file as of 05/22/2016.     Functional Status:  In your present state of health, do you have any difficulty performing the following activities: 05/07/2016 04/15/2016  Hearing? N N  Vision? Y N  Difficulty concentrating or making decisions? N N  Walking or climbing stairs? Y Y  Dressing or bathing? N N  Doing errands, shopping? [name]  Preparing Food and eating ? Y -  Using the Toilet? N -  In the past six months, have you accidently leaked urine? N -  Do you have problems with loss of bowel control? N -  Managing your Medications? Y -  Managing your Finances? Y -  Housekeeping or managing your Housekeeping? Y -  Some recent data might be hidden    Fall/Depression Screening:  PHQ 2/9 Scores 05/07/2016  PHQ - 2 Score 6  PHQ- 9 Score 12    Assessment: Patient referred to  Darrell Clark for in home therapy-medicare approved [id number] during the visit . A voicemail message was left with patient's contact information to discuss in home therapy further.  Patient also given information for the  Darrell Clark program to increase his socialization as well as a  list of private duty aids if needed in the future. Patient also agreeable with a referral for Meals on Wheels and will be contacted today to complete.   Alternative transportation options provided. Phone number given for [facility] to use as a back up if needed to get to medical appointments ([phone].  Plan: This Education officer, museum will follow up with patient in approximately 1 month to follow up on mental health referral and Meals on Wheels referral made and to assess for any additional community resource needs.   Darrell Clark Darrell Clark [phone]

## 2016-05-23 ENCOUNTER — Ambulatory Visit: Payer: PPO | Admitting: *Deleted

## 2016-05-25 ENCOUNTER — Other Ambulatory Visit: Payer: Self-pay | Admitting: *Deleted

## 2016-05-25 NOTE — Patient Outreach (Signed)
[facility]) Care Management  05/25/2016  Darrell Clark 10-23-1932 [id number]   Patient referred to Meals on Wheels [phone], referral completed by  [name]  who has placed patient on the waiting list.  Current waiting list is approximately over 100 people for the [place] area.   [name] [facility] [phone]

## 2016-05-28 ENCOUNTER — Other Ambulatory Visit: Payer: Self-pay | Admitting: *Deleted

## 2016-05-28 NOTE — Patient Outreach (Signed)
Transition of care call successful calling pt on his mobile number, ongoing follow up on recent SNF discharge 10/31-11/12, recent hospitalization 10/28-10/30 vision changes, CVA.   Spoke with pt, HIPAA verified (name, date of birth, address).   Pt reports head hurts sometimes, no recent ED visit but did call EMS last week- could not breath, checked  BP elevated 167/104.   Pt reports  when EMS came- breathing and BP okay (anxiety).  Pt reports he saw Dr. Brynda Greathouse today, BP was okay, MD gave him prescription to continue to take  more of the same  anxiety medication, still to be short term.  Pt reports cannot remember the name of the medication, currently at pharmacy, dropped off prescription.  Pt reports if he takes the anxiety medication, walks around- it helps a little.  Pt reports he feels bloated today, told coming from anxiety.  Pt reports he is taking all of his other medications, son still filling pill planner, concerned when son goes out of town for 2-3 days.   RN CM reviewed with pt to have son do pill planners for 2 weeks.   Pt reports not sure if his BP machine is right, HH RN still coming twice a week and as discussed with RN CM to have [facility] RN check accuracy of his machine.   RN CM discussed  With  pt to call MD if medication is not working to which pt agreed as  MD did say can try a different medication if needed.     Plan:  As discussed with pt, RN CM to follow up again next week telephonically as part of ongoing transition of care (final transition of care call).   Zara Chess.   [facility]  [phone]

## 2016-05-28 NOTE — Patient Outreach (Signed)
10:07 am:  Attempt made to contact pt as part of ongoing transition of care, follow up on recent SNF discharge 10/31-11/12, recent hospitalization 10/28-10/30 vision changes, CVA.   HIPAA compliant voice message left with contact name and number.    Plan:  RN CM to call pt's  mobile number.    Darrell Clark.   [facility]  [phone]

## 2016-06-04 ENCOUNTER — Other Ambulatory Visit: Payer: Self-pay | Admitting: *Deleted

## 2016-06-04 ENCOUNTER — Encounter: Payer: Self-pay | Admitting: *Deleted

## 2016-06-04 NOTE — Patient Outreach (Signed)
Final transition of care call completed, follow up on recent SNF discharge 10/31-11/12, recent hospitalization 10/28- 10/30 for vision changes, CVA.   Spoke with pt, HIPAA/identity verified, discussed purpose of call.  Pt reports continues to take anxiety medication, helps but does not last long - started over the weekend.  Pt reports he has not checked his BP, has not way of checking it, machine he has is old.  Pt reports  [place] RN checked his BP last week, did not say it was high, HH RN has discharged him.  RN CM discussed with pt getting a new BP machine to which he said he would try.   Pt reports he did follow up with Eye MD again, told don't think will get his sight back (left eye).  Pt reports he continues to drive local, see spouse in SNF.   RN CM informed  pt today final transition of care call, discussed continue to follow with community nurse case management services (self management of Hypertension) to which pt agreed.   Plan:  As discussed, plan to follow up again next month- home visit.            Plan to inform Dr. Brynda Greathouse transition of care program completed, plan to continue to provide community nurse case management services.    Zara Chess.   [facility]  [phone]

## 2016-06-16 ENCOUNTER — Encounter: Payer: Self-pay | Admitting: Radiology

## 2016-06-16 ENCOUNTER — Emergency Department
Admission: EM | Admit: 2016-06-16 | Discharge: 2016-06-16 | Disposition: A | Discharge status: Home / Self Care | Payer: PPO | Attending: Emergency Medicine | Admitting: Emergency Medicine

## 2016-06-16 ENCOUNTER — Emergency Department: Payer: PPO

## 2016-06-16 DIAGNOSIS — Z7982 Long term (current) use of aspirin: Secondary | ICD-10-CM | POA: Insufficient documentation

## 2016-06-16 DIAGNOSIS — I10 Essential (primary) hypertension: Secondary | ICD-10-CM | POA: Insufficient documentation

## 2016-06-16 DIAGNOSIS — Z8546 Personal history of malignant neoplasm of prostate: Secondary | ICD-10-CM | POA: Diagnosis not present

## 2016-06-16 DIAGNOSIS — Z7984 Long term (current) use of oral hypoglycemic drugs: Secondary | ICD-10-CM | POA: Diagnosis not present

## 2016-06-16 DIAGNOSIS — R55 Syncope and collapse: Secondary | ICD-10-CM | POA: Insufficient documentation

## 2016-06-16 DIAGNOSIS — I2510 Atherosclerotic heart disease of native coronary artery without angina pectoris: Secondary | ICD-10-CM | POA: Insufficient documentation

## 2016-06-16 DIAGNOSIS — E119 Type 2 diabetes mellitus without complications: Secondary | ICD-10-CM | POA: Diagnosis not present

## 2016-06-16 DIAGNOSIS — R0602 Shortness of breath: Secondary | ICD-10-CM | POA: Insufficient documentation

## 2016-06-16 DIAGNOSIS — Z87891 Personal history of nicotine dependence: Secondary | ICD-10-CM | POA: Diagnosis not present

## 2016-06-16 DIAGNOSIS — Z79899 Other long term (current) drug therapy: Secondary | ICD-10-CM | POA: Insufficient documentation

## 2016-06-16 LAB — BASIC METABOLIC PANEL
ANION GAP: 6 (ref 5–15)
BUN: 13 mg/dL (ref 6–20)
CHLORIDE: 107 mmol/L (ref 101–111)
CO2: 27 mmol/L (ref 22–32)
Calcium: 9.1 mg/dL (ref 8.9–10.3)
Creatinine, Ser: 0.87 mg/dL (ref 0.61–1.24)
GFR calc non Af Amer: >60 mL/min (ref >60)
Glucose, Bld: 88 mg/dL (ref 65–99)
Potassium: 3.7 mmol/L (ref 3.5–5.1)
Sodium: 140 mmol/L (ref 135–145)

## 2016-06-16 LAB — CBC
HEMATOCRIT: 47.4 % (ref 40.0–52.0)
HEMOGLOBIN: 16.4 g/dL (ref 13.0–18.0)
MCH: 32.5 pg (ref 26.0–34.0)
MCHC: 34.5 g/dL (ref 32.0–36.0)
MCV: 94 fL (ref 80.0–100.0)
Platelets: 191 10*3/uL (ref 150–440)
RBC: 5.04 MIL/uL (ref 4.40–5.90)
RDW: 14.5 % (ref 11.5–14.5)
WBC: 7.6 10*3/uL (ref 3.8–10.6)

## 2016-06-16 LAB — FIBRIN DERIVATIVES D-DIMER (ARMC ONLY): Fibrin derivatives D-dimer (ARMC): 907 — ABNORMAL HIGH (ref 0–499)

## 2016-06-16 LAB — TROPONIN I
Troponin I: <0.03 ng/mL (ref <0.03)
Troponin I: <0.03 ng/mL (ref <0.03)

## 2016-06-16 MED ORDER — ONDANSETRON 4 MG PO TBDP
4.0000 mg | ORAL_TABLET | Freq: Once | ORAL | Status: AC
  Administered 2016-06-16: 4 mg via ORAL

## 2016-06-16 MED ORDER — ONDANSETRON 4 MG PO TBDP
ORAL_TABLET | ORAL | Status: AC
  Filled 2016-06-16: qty 1

## 2016-06-16 MED ORDER — IOPAMIDOL (ISOVUE-370) INJECTION 76%
75.0000 mL | Freq: Once | INTRAVENOUS | Status: AC | PRN
  Administered 2016-06-16: 75 mL via INTRAVENOUS

## 2016-06-16 NOTE — ED Triage Notes (Signed)
Pt states that he had a stroke around 3 mos ago, states that he went to [employer] this morning and almost passed out, pt states that he feels bad and at times when he lays down he feels like his air is being cut off

## 2016-06-16 NOTE — ED Notes (Signed)
Pt. And family updated on plan of care at this time,.

## 2016-06-16 NOTE — ED Notes (Signed)
Pt. Calling son at this time to arrange transport.

## 2016-06-16 NOTE — ED Notes (Signed)
Pt. Verbalizes understanding of d/c instructions and follow-up. VS stable and pain controlled per pt.  Pt. In NAD at time of d/c and denies further concerns regarding this visit. Pt. Stable at the time of departure from the unit, departing unit by the safest and most appropriate manner per that pt condition and limitations. Pt advised to return to the ED at any time for emergent concerns, or for new/worsening symptoms.   

## 2016-06-16 NOTE — ED Notes (Signed)
Pt. Transported to CT at this time in stable condition.

## 2016-06-16 NOTE — ED Provider Notes (Signed)
Time Seen: Approximately 1549  I have reviewed the triage notes  Chief Complaint: Near Syncope   History of Present Illness: Darrell Clark is a 82 y.o. male *who presents with a feelings of lightheadedness without actual loss of consciousness today. Patient states she's felt bad and has had some periodic shortness of breath. He denies any focal weakness. He denies any chest pain or heart palpitations. He denies any new focal weakness though he has history of a cerebrovascular accident and was concerned and came here to the emergency department for further evaluation. To of cough and states his shortness of breath is mostly with ambulation. He also has history of anxiety and states that may be also causing his shortness of breath. He denies any new leg pain or swelling. He denies any history of blood clots in legs or lungs.   Past Medical History:  Diagnosis Date  . Allergy   . Anxiety   . Arthritis   . CAD (coronary artery disease)   . Diabetes mellitus without complication ([place])   . GERD (gastroesophageal reflux disease)   . HLD (hyperlipidemia)   . Hypertension   . Prostate cancer ([place])   . Stroke [facility])     Patient Active Problem List   Diagnosis Date Noted  . Vision, loss, sudden 04/16/2016  . 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 Intracranial vascular stenosis 04/16/2016  . CVA (cerebral vascular accident) ([place]) 04/15/2016  . HTN (hypertension) 04/15/2016  . Diabetes ([employer]) 04/15/2016  . GERD (gastroesophageal reflux disease) 04/15/2016  . HLD (hyperlipidemia) 04/15/2016    Past Surgical History:  Procedure Laterality Date  . APPENDECTOMY      Past Surgical History:  Procedure Laterality Date  . APPENDECTOMY      Current Outpatient Rx  . Order #: [id number] Class: Historical Med  . Order #: [id number] Class: Historical Med  . Order #: [id number] Class: Normal  . Order #: [id number] Class: Normal  . Order #:  [id number] Class: Historical Med  . Order #: [id number] Class: Historical Med  . Order #: [id number] Class: Normal  . Order #: [id number] Class: Historical Med  . Order #: [id number] Class: Normal  . Order #: [id number] Class: Historical Med  . Order #: [id number] Class: Historical Med  . Order #: [id number] Class: Historical Med  . Order #: [id number] Class: Historical Med    Allergies:  Niacin and Niacin and related  Family History: Family History  Problem Relation Age of Onset  . Heart attack Mother   . Hypertension Mother   . Heart attack Father   . Hypertension Father   . Hypertension Sister   . Cancer Brother   . Hypertension Brother     Social History: Social History  Substance Use Topics  . Smoking status: Former Research scientist (life sciences)  . Smokeless tobacco: Never Used     Comment: quit 10 years ago   . Alcohol use No     Review of Systems:   10 point review of systems was performed and was otherwise negative:  Constitutional: No fever Eyes: No visual disturbances ENT: No sore throat, ear pain Cardiac: No chest pain Respiratory: Shortness of breath is variable and states sometimes it occurs when he lies flat and sometimes with exertion and also in association with his anxiety. Abdomen: No abdominal pain, no vomiting, No diarrhea Endocrine: No weight loss, No night sweats Extremities: No peripheral edema, cyanosis Skin: No rashes, easy bruising Neurologic: No focal weakness, trouble with speech or swollowing Urologic: No dysuria, Hematuria, or urinary  frequency   Physical Exam:  ED Triage Vitals  Enc Vitals Group     BP 06/16/16 1256 134/78     Pulse Rate 06/16/16 1256 67     Resp 06/16/16 1256 18     Temp 06/16/16 1256 97.7 F (36.5 C)     Temp Source 06/16/16 1256 Oral     SpO2 06/16/16 1256 99 %     Weight 06/16/16 1257 217 lb (98.4 kg)     Height 06/16/16 1257 5\' 11"  (1.803 m)     Head Circumference --      [place] Flow --      Pain Score 06/16/16 1257 4     Pain Loc --       Pain Edu? --      Excl. in [place]? --     General: Awake , Alert , and Oriented times 3; GCS 15 Head: Normal cephalic , atraumatic Eyes: Pupils equal , round, reactive to light Nose/Throat: No nasal drainage, patent upper airway without erythema or exudate.  Neck: Supple, Full range of motion, No anterior adenopathy or palpable thyroid masses Lungs: Clear to ascultation without wheezes , rhonchi, or rales Heart: Regular rate, regular rhythm without murmurs , gallops , or rubs Abdomen: Soft, non tender without rebound, guarding , or rigidity; bowel sounds positive and symmetric in all 4 quadrants. No organomegaly .        Extremities: 2 plus symmetric pulses. No edema, clubbing or cyanosis Neurologic: normal ambulation, Motor symmetric without deficits, sensory intact Skin: warm, dry, no rashes   Labs:   All laboratory work was reviewed including any pertinent negatives or positives listed below:  Labs Reviewed  FIBRIN DERIVATIVES D-DIMER ([place]) - Abnormal; Notable for the following:       Result Value   Fibrin derivatives D-dimer [facility] 907 (*)    All other components within normal limits  BASIC METABOLIC PANEL  CBC  TROPONIN I  TROPONIN I  D-dimer test was elevated Serial troponins were negative EKG: ED ECG REPORT I, Daymon Larsen, the attending physician, personally viewed and interpreted this ECG.  Date: 06/16/2016 EKG Time: 1300Rate: 66 Rhythm: normal sinus rhythm QRS Axis: normal Intervals: Left anterior fascicular block ST/T Wave abnormalities: normal Conduction Disturbances: none Narrative Interpretation: unremarkable Poor R-wave progression in the septal leads No acute ischemic changes Radiology: * "Dg Chest 2 View  Result Date: 06/16/2016 CLINICAL DATA:  Pt states that he had a stroke around 3 mos ago, states that he went to [place] this morning and almost passed out, pt states that he feels bad and at times when he lays down he feels like his air is being  cut off. History of smoking. EXAM: CHEST  2 VIEW COMPARISON:  05/13/2016 FINDINGS: Heart size is upper normal. The aorta is tortuous. There are no focal consolidations or pleural effusions. No pulmonary edema. Degenerative changes are seen in thoracic spine. IMPRESSION: No evidence for acute  abnormality. Electronically Signed   By: Nolon Nations M.D.   On: 06/16/2016 13:34   Ct Head Wo Contrast  Result Date: 06/16/2016 CLINICAL DATA:  Pt states that he had a stroke around 3 mos ago, states that he went to [place] this morning and almost passed out, pt states that he feels bad and at times when he lays down he feels like his air is being cut off. EXAM: CT HEAD WITHOUT CONTRAST TECHNIQUE: Contiguous axial images were obtained from the base of the  skull through the vertex without intravenous contrast. COMPARISON:  05/13/2016 FINDINGS: Brain: There is mild central and cortical atrophy. There is no intra or extra-axial fluid collection or mass lesion. The basilar cisterns and ventricles have a normal appearance. There is no CT evidence for acute infarction or hemorrhage. Vascular: There is atherosclerotic calcification of the carotid siphons. Skull: Normal. Negative for fracture or focal lesion. Sinuses/Orbits: No acute finding. Other: None IMPRESSION: No evidence for acute intracranial abnormality.  Mild atrophy. Electronically Signed   By: Nolon Nations M.D.   On: 06/16/2016 14:26   Ct Angio Chest Pe W Or Wo Contrast  Result Date: 06/16/2016 CLINICAL DATA:  Shortness of breath and chest pain. EXAM: CT ANGIOGRAPHY CHEST WITH CONTRAST TECHNIQUE: Multidetector CT imaging of the chest was performed using the standard protocol during bolus administration of intravenous contrast. Multiplanar CT image reconstructions and MIPs were obtained to evaluate the vascular anatomy. CONTRAST:  75 cc Isovue 370 COMPARISON:  Chest x-ray dated 06/16/2016 FINDINGS: Cardiovascular: No pulmonary emboli. Cardiomegaly. No  pericardial effusions. Dilatation of the ascending thoracic aorta to a diameter of 4.8 cm. Calcification in the aortic valve. Aortic atherosclerosis. Mediastinum/Nodes: No enlarged mediastinal, hilar, or axillary lymph nodes. Thyroid gland, trachea, and esophagus demonstrate no significant findings. Lungs/Pleura: Lungs are clear except for minimal linear atelectasis at the left lung base. No pleural effusion or pneumothorax. Upper Abdomen: Extensive cholelithiasis.  Otherwise normal. Musculoskeletal: No chest wall abnormality. No acute or significant osseous findings. Review of the MIP images confirms the above findings. IMPRESSION: No pulmonary emboli. Dilated ascending thoracic aorta to a diameter 4.8 cm. Does the patient have a murmur of aortic stenosis? Recommend semi-annual imaging followup by CTA or MRA and referral to cardiothoracic surgery if not already obtained. This recommendation follows 2010 ACCF/AHA/AATS/ACR/ASA/SCA/SCAI/SIR/STS/SVM Guidelines for the Diagnosis and Management of Patients With Thoracic Aortic Disease. Circulation. 2010; 121: e266-e36 Cholelithiasis. Electronically Signed   By: Lorriane Shire M.D.   On: 06/16/2016 20:52  "  I personally reviewed the radiologic studies    ED Course:  Patient's stay here was uneventful and he is symptomatically improved with oral Ativan here in emergency department. His pulse ox is are stable. Because of his near-syncope and feelings of shortness of breath reevaluated both complaints with a D-dimer test which was positive. Patient underwent chest CT evaluation which was negative for blood clot but does show some dilation of the aorta without any evidence of aneurysmal disease etc. Patient otherwise felt could be discharged at this time. He had no syncopal episode and most of his shortness of breath was corrected with IV Ativan and serial cardiac enzymes are negative. No evidence of pulmonary edema etc. Clinical Course      Assessment: * Near  syncope Dyspnea  Final Clinical Impression:  Final diagnoses:  Shortness of breath  Near syncope     Plan: * Outpatient Continue current medications Patient was advised to return immediately if condition worsens. Patient was advised to follow up with their primary care physician or other specialized physicians involved in their outpatient care. The patient and/or family member/power of attorney had laboratory results reviewed at the bedside. All questions and concerns were addressed and appropriate discharge instructions were distributed by the nursing staff.             Daymon Larsen, MD 06/16/16 2206

## 2016-06-16 NOTE — ED Notes (Signed)
Pt states that his lips are tingling and that he does have anxiety and it is time to take his anxiety pill, pt taking his ativan 0.5mg  by mouth

## 2016-06-16 NOTE — Discharge Instructions (Signed)
Please return immediately if condition worsens. Please contact her primary physician or the physician you were given for referral. If you have any specialist physicians involved in her treatment and plan please also contact them. Thank you for using [facility] regional emergency Department.  Please continue all your regular medications.

## 2016-06-22 ENCOUNTER — Other Ambulatory Visit: Payer: Self-pay | Admitting: *Deleted

## 2016-06-22 NOTE — Patient Outreach (Signed)
[facility]) Care Management  [facility] Social Work  06/22/2016  Darrell Clark February 14, 1933 [id number]  Subjective:  Patient is a 83 year old male. Per patient, he continues to reside alone in his own home.  Patient's spouse has been approved for medicaid and is now residing at [facility] under long term care.  Per patient, he has been experiencing a considerable amount of financial stress but now that his wife has been approved for Medicaid his financial burdens have decreased.  He is now concerned about living on his social security only.  Patient states that he continues to take the medication for anxiety, stating that he takes it when he feels a panic attack coming on.  Patient states that [facility] that provides in home therapy did call him, however states that he declined therapy due to the co-pay.  Patient  states that he is doing okay for now.  Per patient, his daughter in [place] mother will be coming over to assist with his household needs.  Her first visit will be next Wednesday.  He is hopingf that in addition to housecleaning she will be able to prepare some small meals. Patient states he fears to use the oven because he forgets to turn the stove off, even with written reminders.  Objective:   Encounter Medications:  Outpatient Encounter Prescriptions as of 06/22/2016  Medication Sig  . Acetaminophen 500 MG coapsule Take by mouth. Pt takes one capsule twice a day, am/pm  . amLODipine (NORVASC) 5 MG tablet Take 5 mg by mouth daily.   Marland Kitchen aspirin EC 81 MG EC tablet Take 1 tablet (81 mg total) by mouth daily.  . clopidogrel (PLAVIX) 75 MG tablet Take 1 tablet (75 mg total) by mouth daily.  Marland Kitchen donepezil (ARICEPT) 10 MG tablet Take 10 mg by mouth at bedtime.   . fluticasone (FLONASE) 50 MCG/ACT nasal spray Place 2 sprays into both nostrils daily.   Marland Kitchen glipiZIDE (GLUCOTROL XL) 2.5 MG 24 hr tablet Take 1 tablet (2.5 mg total) by mouth daily with breakfast.  . Misc Natural  Products (OSTEO BI-FLEX/5-LOXIN ADVANCED PO) Take by mouth. Pt takes 2 tablets daily  . ONE TOUCH ULTRA TEST test strip   . ONETOUCH DELICA LANCETS 99991111 MISC   . pantoprazole (PROTONIX) 40 MG tablet Take 40 mg by mouth daily.   . simvastatin (ZOCOR) 20 MG tablet Take 20 mg by mouth daily at 6 PM.   . NEOMYCIN-POLYMYXIN-HYDROCORTISONE (CORTISPORIN) 1 % SOLN otic solution Apply 1-2 drops to toe BID after soaking (Patient not taking: Reported on 06/22/2016)   No facility-administered encounter medications on file as of 06/22/2016.     Functional Status:  In your present state of health, do you have any difficulty performing the following activities: 05/07/2016 04/15/2016  Hearing? N N  Vision? Y N  Difficulty concentrating or making decisions? N N  Walking or climbing stairs? Y Y  Dressing or bathing? N N  Doing errands, shopping? [name]  Preparing Food and eating ? Y -  Using the Toilet? N -  In the past six months, have you accidently leaked urine? N -  Do you have problems with loss of bowel control? N -  Managing your Medications? Y -  Managing your Finances? Y -  Housekeeping or managing your Housekeeping? Y -  Some recent data might be hidden    Fall/Depression Screening:  PHQ 2/9 Scores 05/07/2016  PHQ - 2 Score 6  PHQ- 9 Score 12  Assessment:  Patient very friendly and welcoming. Still very concerned about using the stove, even with written reminders. Patient's son very helpful and has arranged for a housekeeper for patient.  Patient declines mental health support at this time due to the co-pays. Patient adjusting to living on a single income.  Patient would like to see how housekeeper works out.  Patient will accept additional referrals for private duty aids if housekeeper plans do not work out.  Plan:  This Education officer, museum will follow up with patient in 1 month to check status of housekeeper and to assess for any additional social work needs.   [name] [facility] Care  Management [phone]

## 2016-07-02 ENCOUNTER — Ambulatory Visit: Payer: PPO | Admitting: Podiatry

## 2016-07-06 ENCOUNTER — Other Ambulatory Visit: Payer: Self-pay | Admitting: *Deleted

## 2016-07-06 NOTE — Patient Outreach (Signed)
[facility]) Care Management   07/06/2016  Fuad DIVINE HANSLEY 11-08-1932 [id number]  Eliazar SLAYDE BRAULT is an 83 y.o. male  Subjective:  Pt reports talked to MD about getting help with meals, shopping,housework plus His insurance said will pay for 35 hours a week but will need MD to do referral. Pt reports to  Follow up with MD about this 1/23.   Pt reports daughter  in [place] mother suppose to come help with housework, some meals but will have to pay her.   Pt reports BP been running high, been  checking daily, MD started him on a new BP medication.   Pt reports also been started on medication for anxiety, taking every 8 hours as needed, helping.   Objective:   Vitals:   07/06/16 1114 07/06/16 1134  BP: 140/78 (!) 141/80  Pulse:  72  Resp:  20    ROS  Physical Exam  Constitutional: He is oriented to person, place, and time. He appears well-developed and well-nourished.  Cardiovascular: Normal rate, regular rhythm and normal heart sounds.   Respiratory: Effort normal and breath sounds normal.  GI: Soft. Bowel sounds are normal.  Musculoskeletal: Normal range of motion. He exhibits no edema.  Neurological: He is alert and oriented to person, place, and time.  Skin: Skin is warm and dry.  Psychiatric: He has a normal mood and affect. His behavior is normal. Judgment and thought content normal.    Encounter Medications:   Outpatient Encounter Prescriptions as of 07/06/2016  Medication Sig Note  . Acetaminophen 500 MG coapsule Take by mouth. Pt takes one capsule twice a day, am/pm 07/06/2016: As needed  . amLODipine (NORVASC) 5 MG tablet Take 5 mg by mouth daily.    Marland Kitchen aspirin EC 81 MG EC tablet Take 1 tablet (81 mg total) by mouth daily.   . clopidogrel (PLAVIX) 75 MG tablet Take 1 tablet (75 mg total) by mouth daily.   Marland Kitchen docusate sodium (COLACE) 100 MG capsule Take 100 mg by mouth 2 (two) times daily. As needed. 07/06/2016: As needed.   . donepezil (ARICEPT) 10 MG  tablet Take 10 mg by mouth at bedtime.    . fluticasone (FLONASE) 50 MCG/ACT nasal spray Place 2 sprays into both nostrils daily.    Marland Kitchen glipiZIDE (GLUCOTROL XL) 2.5 MG 24 hr tablet Take 1 tablet (2.5 mg total) by mouth daily with breakfast.   . lisinopril (PRINIVIL,ZESTRIL) 20 MG tablet Take 20 mg by mouth daily. Pt takes in evening   . LORazepam (ATIVAN) 0.5 MG tablet Take 0.5 mg by mouth every 8 (eight) hours. 07/06/2016: As needed.   . Misc Natural Products (OSTEO BI-FLEX/5-LOXIN ADVANCED PO) Take by mouth. Pt takes 2 tablets daily   . ONE TOUCH ULTRA TEST test strip    . ONETOUCH DELICA LANCETS 56L MISC    . pantoprazole (PROTONIX) 40 MG tablet Take 40 mg by mouth daily.    . simethicone (MYLICON) 80 MG chewable tablet Chew 80 mg by mouth every 6 (six) hours as needed. As needed. 07/06/2016: As needed.   . simvastatin (ZOCOR) 20 MG tablet Take 20 mg by mouth daily at 6 PM.    . NEOMYCIN-POLYMYXIN-HYDROCORTISONE (CORTISPORIN) 1 % SOLN otic solution Apply 1-2 drops to toe BID after soaking (Patient not taking: Reported on 06/22/2016)    No facility-administered encounter medications on file as of 07/06/2016.     Functional Status:   In your present state of health, do you have any  difficulty performing the following activities: 05/07/2016 04/15/2016  Hearing? N N  Vision? Y N  Difficulty concentrating or making decisions? N N  Walking or climbing stairs? Y Y  Dressing or bathing? N N  Doing errands, shopping? [name]  Preparing Food and eating ? Y -  Using the Toilet? N -  In the past six months, have you accidently leaked urine? N -  Do you have problems with loss of bowel control? N -  Managing your Medications? Y -  Managing your Finances? Y -  Housekeeping or managing your Housekeeping? Y -  Some recent data might be hidden    Fall/Depression Screening:    PHQ 2/9 Scores 05/07/2016  PHQ - 2 Score 6  PHQ- 9 Score 12    Assessment:  Pleasant 83 year old gentleman, lives alone.  Son  arrived during home visit- reports  Prepares pt's medications daily (adherence).             Hypertension: BP today 140/78 with nurse's cuff, 141/80 with pt's machine (Lisinopril recently           Started).   Review of pt's BP readings 141/93, 156/89,156/85,138/90178/96,174/84.          DM:  Review of pt's glucometer- sugars= 89,105,124,131,158,164.  Assistance with IADLs- pt called his insurance during home visit, it was clarified coverage was  For Home health not personal care services.     Plan:   As discussed with pt, plan to discharge from community nurse case management  Services, goals met.             Plan to relay to [place] of discharge.              Plan to send Dr. Brynda Greathouse update letter notifying of discharge.     [facility] CM Care Plan Problem Two   Flowsheet Row Most Recent Value  Care Plan Problem Two  Hypertension- hx of CVA   Role Documenting the Problem Two  Care Management Coordinator  Care Plan for Problem Two  Active  [facility] Long Term Goal (31-90) days  Pt will better self manage BP within the next 40 days   [facility] Long Term Goal Start Date  06/04/16  [facility] Long Term Goal Met Date  07/06/16  [facility] CM Short Term Goal #1 (0-30 days)  Pt will purchase new BP machine, start checking BP 5 days a week, record within next 30 days   [facility] CM Short Term Goal #1 Start Date  06/04/16  [facility] CM Short Term Goal #1 Met Date   07/06/16      Zara Chess.   [place] Care Management  [phone]

## 2016-07-07 ENCOUNTER — Encounter: Payer: Self-pay | Admitting: *Deleted

## 2016-07-09 ENCOUNTER — Ambulatory Visit (INDEPENDENT_AMBULATORY_CARE_PROVIDER_SITE_OTHER): Payer: Medicare Other | Admitting: Podiatry

## 2016-07-09 ENCOUNTER — Encounter: Payer: Self-pay | Admitting: Podiatry

## 2016-07-09 DIAGNOSIS — E1142 Type 2 diabetes mellitus with diabetic polyneuropathy: Secondary | ICD-10-CM

## 2016-07-09 DIAGNOSIS — M79676 Pain in unspecified toe(s): Secondary | ICD-10-CM | POA: Diagnosis not present

## 2016-07-09 DIAGNOSIS — B351 Tinea unguium: Secondary | ICD-10-CM

## 2016-07-09 DIAGNOSIS — M2010 Hallux valgus (acquired), unspecified foot: Secondary | ICD-10-CM

## 2016-07-09 NOTE — Progress Notes (Signed)
Complaint:  Visit Type: Patient returns to my office for continued preventative foot care services. Complaint: Patient states" my nails have grown long and thick and become painful to walk and wear shoes" Patient has been diagnosed with DM with no foot complications. The patient presents for preventative foot care services. No changes to ROS.  He requests a new pair of diabetic shoes.  Podiatric Exam: Vascular: dorsalis pedis and posterior tibial pulses are palpable bilateral. Capillary return is immediate. Temperature gradient is WNL. Skin turgor WNL  Sensorium: Normal Semmes Weinstein monofilament test. Normal tactile sensation bilaterally. Nail Exam: Pt has thick disfigured discolored nails with subungual debris noted bilateral entire nail hallux through fifth toenails Ulcer Exam: There is no evidence of ulcer or pre-ulcerative changes or infection. Orthopedic Exam: Muscle tone and strength are WNL. No limitations in general ROM. No crepitus or effusions noted. Foot type and digits show no abnormalities  HAV  B/L. Skin: No Porokeratosis. No infection or ulcers  Diagnosis:  Onychomycosis, , Pain in right toe, pain in left toes,  Diabetes with neuropathy   HAV  B/L  Treatment & Plan Procedures and Treatment: Consent by patient was obtained for treatment procedures. The patient understood the discussion of treatment and procedures well. All questions were answered thoroughly reviewed. Debridement of mycotic and hypertrophic toenails, 1 through 5 bilateral and clearing of subungual debris. No ulceration, no infection noted.  Return Visit-Office Procedure: Patient instructed to return to the office for a follow up visit 3 months for continued evaluation and treatment.    Gardiner Barefoot DPM

## 2016-07-23 ENCOUNTER — Ambulatory Visit: Payer: Self-pay | Admitting: *Deleted

## 2016-07-27 ENCOUNTER — Emergency Department
Admission: EM | Admit: 2016-07-27 | Discharge: 2016-07-27 | Discharge status: Against Medical Advice | Payer: Medicare Other | Attending: Emergency Medicine | Admitting: Emergency Medicine

## 2016-07-27 ENCOUNTER — Emergency Department: Payer: Medicare Other

## 2016-07-27 DIAGNOSIS — Z79899 Other long term (current) drug therapy: Secondary | ICD-10-CM | POA: Diagnosis not present

## 2016-07-27 DIAGNOSIS — I2510 Atherosclerotic heart disease of native coronary artery without angina pectoris: Secondary | ICD-10-CM | POA: Diagnosis not present

## 2016-07-27 DIAGNOSIS — Z87891 Personal history of nicotine dependence: Secondary | ICD-10-CM | POA: Diagnosis not present

## 2016-07-27 DIAGNOSIS — Z7984 Long term (current) use of oral hypoglycemic drugs: Secondary | ICD-10-CM | POA: Diagnosis not present

## 2016-07-27 DIAGNOSIS — E119 Type 2 diabetes mellitus without complications: Secondary | ICD-10-CM | POA: Diagnosis not present

## 2016-07-27 DIAGNOSIS — I10 Essential (primary) hypertension: Secondary | ICD-10-CM | POA: Insufficient documentation

## 2016-07-27 DIAGNOSIS — Z8546 Personal history of malignant neoplasm of prostate: Secondary | ICD-10-CM | POA: Diagnosis not present

## 2016-07-27 DIAGNOSIS — R079 Chest pain, unspecified: Secondary | ICD-10-CM | POA: Diagnosis present

## 2016-07-27 DIAGNOSIS — F419 Anxiety disorder, unspecified: Secondary | ICD-10-CM | POA: Insufficient documentation

## 2016-07-27 LAB — TROPONIN I: Troponin I: <0.03 ng/mL (ref <0.03)

## 2016-07-27 LAB — BASIC METABOLIC PANEL
Anion gap: 9 (ref 5–15)
BUN: 14 mg/dL (ref 6–20)
CHLORIDE: 106 mmol/L (ref 101–111)
CO2: 25 mmol/L (ref 22–32)
CREATININE: 1.16 mg/dL (ref 0.61–1.24)
Calcium: 9.1 mg/dL (ref 8.9–10.3)
GFR calc Af Amer: >60 mL/min (ref >60)
GFR, EST NON AFRICAN AMERICAN: 57 mL/min — AB (ref >60)
Glucose, Bld: 164 mg/dL — ABNORMAL HIGH (ref 65–99)
Potassium: 3.5 mmol/L (ref 3.5–5.1)
SODIUM: 140 mmol/L (ref 135–145)

## 2016-07-27 LAB — CBC
HCT: 48.6 % (ref 40.0–52.0)
Hemoglobin: 16.7 g/dL (ref 13.0–18.0)
MCH: 32.5 pg (ref 26.0–34.0)
MCHC: 34.4 g/dL (ref 32.0–36.0)
MCV: 94.4 fL (ref 80.0–100.0)
PLATELETS: 180 10*3/uL (ref 150–440)
RBC: 5.15 MIL/uL (ref 4.40–5.90)
RDW: 14.1 % (ref 11.5–14.5)
WBC: 7.2 10*3/uL (ref 3.8–10.6)

## 2016-07-27 NOTE — ED Notes (Signed)
Explained to patient that we would love for him to stay and be check more in depth but patient adamant on getting back to his sick wife that is upstairs. Did stress to patient to return if he starts to feel that way again or any thing out of the ordinary. Patient voiced understanding.

## 2016-07-27 NOTE — ED Provider Notes (Signed)
[facility] Emergency Department Provider Note  ____________________________________________   I have reviewed the triage vital signs and the nursing notes.   HISTORY  Chief Complaint Tremors and Chest Pain    HPI Darrell Clark is a 83 y.o. male he states that he had a panic attack. His wife is in the hospital here and he became very anxious. He denies any ongoing symptoms. He states when he has panic attacks he has a prescription for Ativan which she took and now he feels better. He does not want any further workup. He is demanding to be discharged right now to go see his wife.      Past Medical History:  Diagnosis Date  . Allergy   . Anxiety   . Arthritis   . CAD (coronary artery disease)   . Diabetes mellitus without complication ([place])   . GERD (gastroesophageal reflux disease)   . HLD (hyperlipidemia)   . Hypertension   . Prostate cancer ([place])   . Stroke [facility])     Patient Active Problem List   Diagnosis Date Noted  . Vision, loss, sudden 04/16/2016  . 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 Intracranial vascular stenosis 04/16/2016  . CVA (cerebral vascular accident) ([place]) 04/15/2016  . HTN (hypertension) 04/15/2016  . Diabetes ([place]) 04/15/2016  . GERD (gastroesophageal reflux disease) 04/15/2016  . HLD (hyperlipidemia) 04/15/2016    Past Surgical History:  Procedure Laterality Date  . APPENDECTOMY      Prior to Admission medications   Medication Sig Start Date End Date Taking? Authorizing Provider  acetaminophen (TYLENOL) 325 MG tablet Take by mouth.    Historical Provider, MD  amLODipine (NORVASC) 5 MG tablet Take 5 mg by mouth daily.  02/21/13   Historical Provider, MD  aspirin EC 81 MG EC tablet Take 1 tablet (81 mg total) by mouth daily. 04/16/16   Theodoro Grist, MD  benazepril (LOTENSIN) 20 MG tablet Take by mouth.    Historical Provider, MD  clopidogrel (PLAVIX) 75 MG tablet  Take 1 tablet (75 mg total) by mouth daily. 04/16/16   Theodoro Grist, MD  docusate sodium (COLACE) 100 MG capsule Take 100 mg by mouth 2 (two) times daily. As needed.    Historical Provider, MD  donepezil (ARICEPT) 10 MG tablet Take 10 mg by mouth at bedtime.  02/21/13   Historical Provider, MD  Elastic Bandages & Supports (ACE BACK STABILIZER) MISC Use 1 Units once daily as needed. 10/03/15   Historical Provider, MD  fluticasone (FLONASE) 50 MCG/ACT nasal spray Place 2 sprays into both nostrils daily.  04/16/13   Historical Provider, MD  glipiZIDE (GLUCOTROL XL) 2.5 MG 24 hr tablet Take 1 tablet (2.5 mg total) by mouth daily with breakfast. 04/16/16   Theodoro Grist, MD  lisinopril (PRINIVIL,ZESTRIL) 20 MG tablet Take 20 mg by mouth daily. Pt takes in evening    Historical Provider, MD  LORazepam (ATIVAN) 0.5 MG tablet Take 0.5 mg by mouth every 8 (eight) hours.    Historical Provider, MD  Misc Natural Products (OSTEO BI-FLEX/5-LOXIN ADVANCED PO) Take by mouth. Pt takes 2 tablets daily    Historical Provider, MD  NEOMYCIN-POLYMYXIN-HYDROCORTISONE (CORTISPORIN) 1 % SOLN otic solution Apply 1-2 drops to toe BID after soaking Patient not taking: Reported on 06/22/2016 03/26/16   Max T Hyatt, DPM  nitroGLYCERIN (NITROSTAT) 0.4 MG SL tablet Place under the tongue. 11/20/13   Historical Provider, MD  ONE TOUCH ULTRA TEST test  strip  04/16/13   Historical Provider, MD  Jonetta Speak LANCETS [id number] [place]  04/16/13   Historical Provider, MD  pantoprazole (PROTONIX) 40 MG tablet Take 40 mg by mouth daily.  08/06/13   Historical Provider, MD  simethicone (MYLICON) 80 MG chewable tablet Chew 80 mg by mouth every 6 (six) hours as needed. As needed.    Historical Provider, MD  simvastatin (ZOCOR) 20 MG tablet Take 20 mg by mouth daily at 6 PM.  03/09/13   Historical Provider, MD    Allergies Niacin and Niacin and related  Family History  Problem Relation Age of Onset  . Heart attack Mother   . Hypertension Mother    . Heart attack Father   . Hypertension Father   . Hypertension Sister   . Cancer Brother   . Hypertension Brother     Social History Social History  Substance Use Topics  . Smoking status: Former Research scientist (life sciences)  . Smokeless tobacco: Never Used     Comment: quit 10 years ago   . Alcohol use No    Review of Systems Constitutional: No fever/chills Eyes: No visual changes. ENT: No sore throat. No stiff neck no neck pain Cardiovascular: Denies chest pain. Respiratory: Denies shortness of breath. Gastrointestinal:   no vomiting.  No diarrhea.  No constipation. Genitourinary: Negative for dysuria. Musculoskeletal: Negative lower extremity swelling Skin: Negative for rash. Neurological: Negative for severe headaches, focal weakness or numbness. 10-point ROS otherwise negative.  ____________________________________________   PHYSICAL EXAM:  VITAL SIGNS: ED Triage Vitals  Enc Vitals Group     BP 07/27/16 1623 131/82     Pulse Rate 07/27/16 1623 75     Resp 07/27/16 1623 18     Temp 07/27/16 1623 98.3 F (36.8 C)     Temp Source 07/27/16 1623 Oral     SpO2 07/27/16 1623 98 %     Weight 07/27/16 1624 220 lb (99.8 kg)     Height 07/27/16 1624 5\' 11"  (1.803 m)     Head Circumference --      Peak Flow --      Pain Score 07/27/16 1624 6     Pain Loc --      Pain Edu? --      Excl. in [place]? --     Constitutional: Alert and oriented. Well appearing and in no acute distress. Eyes: Conjunctivae are normal. PERRL. EOMI. Head: Atraumatic. Nose: No congestion/rhinnorhea. Mouth/Throat: Mucous membranes are moist.  Oropharynx non-erythematous. Neck: No stridor.   Nontender with no meningismus Cardiovascular: Normal rate, regular rhythm. Grossly normal heart sounds.  Good peripheral circulation. Respiratory: Normal respiratory effort.  No retractions. Lungs CTAB. Abdominal: Soft and nontender. No distention. No guarding no rebound Back:  There is no focal tenderness or step off.  there  is no midline tenderness there are no lesions noted. there is no CVA tenderness Musculoskeletal: No lower extremity tenderness, no upper extremity tenderness. No joint effusions, no DVT signs strong distal pulses no edema Neurologic:  Normal speech and language. No gross focal neurologic deficits are appreciated.  Skin:  Skin is warm, dry and intact. No rash noted. Psychiatric: Mood and affect are normal. Speech and behavior are normal.  ____________________________________________   LABS (all labs ordered are listed, but only abnormal results are displayed)  Labs Reviewed  BASIC METABOLIC PANEL - Abnormal; Notable for the following:       Result Value   Glucose, Bld 164 (*)    GFR calc non Af  Amer 92 (*)    All other components within normal limits  CBC  TROPONIN I   ____________________________________________  EKG  I personally interpreted any EKGs ordered by me or triage Sinus rhythm rate 70 bpm, PAC noted, left axis deviation noted, no acute ischemic changes. ____________________________________________  [id number]  I reviewed any imaging ordered by me or triage that were performed during my shift and, if possible, patient and/or family made aware of any abnormal findings. ____________________________________________   PROCEDURES  Procedure(s) performed: None  Procedures  Critical Care performed: None  ____________________________________________   INITIAL IMPRESSION / ASSESSMENT AND PLAN / ED COURSE  Pertinent labs & imaging results that were available during my care of the patient were reviewed by me and considered in my medical decision making (see chart for details).  Has elected to leave [place]. Obviously there is no way I can determine if he was having a panic attack or some other pathology. Blood work thus far as reassuring. I did want to keep him for further evaluation but he refuses. With Stanton Kidney, the practice manager in the room, he  understands that I cannot rule out life-threatening disease process at this time. He understands he can come back at any time if he feels worse, and that he should follow closely with his primary care doctor.    ____________________________________________   FINAL CLINICAL IMPRESSION(S) / ED DIAGNOSES  Final diagnoses:  None      This chart was dictated using voice recognition software.  Despite best efforts to proofread,  errors can occur which can change meaning.      Schuyler Amor, MD 07/27/16 2028

## 2016-07-27 NOTE — ED Notes (Signed)
Patient wife is up stairs sick. Patient anxious and wanting to get back up stairs. Patient will wait until 8:20pm. Patient thinks he had a panic attack.

## 2016-07-27 NOTE — ED Triage Notes (Signed)
PT reports that he was upstairs in hospital and began feeling fluttering in epigastric area and tremors. Pt reports he has hx of anxiety and when inquiring about pain he just reports feeling "dull pain and anxiety". Pt alert and oriented X4, active, cooperative, pt in NAD. RR even and unlabored, color WNL.

## 2016-07-27 NOTE — Discharge Instructions (Signed)
You feel you had an anxiety attack, but certainly is possible this time we cannot say for sure what happened see her. We would like to keep you here for further testing that you would prefer to go home. This is certainly your choice but it does limit our ability to take care of you. Given your age, shortness of breath or tightness could be a sign of a heart problem or something more serious and could lead to death or significant illness. We have explained this to you and he would prefer to go home. If you change your mind or you feel worse please come back to the emergency department. Otherwise follow closely with her primary care doctor.

## 2016-08-02 ENCOUNTER — Other Ambulatory Visit: Payer: Self-pay | Admitting: *Deleted

## 2016-08-02 NOTE — Patient Outreach (Signed)
[facility]) Care Management  08/02/2016  Darrell Clark 1932/10/24 [id number]   Phone call to patient to check the status of in home support.  Per patient, he does have a housekeeper now, however she is not able to assist with this household needs on a regular basis. Per patient, his doctor completed paperwork for his insurance to provide in home assistance, however he did not know when this service would start. Patient reported having no additional social work needs at this time. Discharge home visit scheduled for 08/06/16 to clarify in home assistance covered by insurance and to determine any final community resource needs.    [name] [facility] Care Management [phone]

## 2016-08-03 DIAGNOSIS — R0602 Shortness of breath: Secondary | ICD-10-CM | POA: Insufficient documentation

## 2016-08-03 DIAGNOSIS — I6523 Occlusion and stenosis of bilateral carotid arteries: Secondary | ICD-10-CM | POA: Insufficient documentation

## 2016-08-06 ENCOUNTER — Other Ambulatory Visit: Payer: Self-pay | Admitting: *Deleted

## 2016-08-06 NOTE — Patient Outreach (Signed)
[facility]) Care Management  [facility] Social Work  08/06/2016  Darrell Clark June 21, 1932 [id number]  Subjective:  Patient states that his wife is currently in the hospital and is not expected to recover.  Patient reports increased feelings of anxiety and depression related to his wife's condition. "My wife took care of everything for me" Patient states that he visits daily but visiting is extremely difficult.  Per patient, his family and friends have been very supportive. Patient states that his insurance company will pay for an in home aid for 36 hours per week.  Patient states that a friend of his connected him with the name of a in home aid and she is waiting on his provider's  office to complete the paperwork.  Patient discussed that his doctor is recommnending a rest home, however he wants to stay in his own home.  Patient requested that this social worker follow up with him next week as he feels that his wife is terminal and is requesting support.   Objective:   Encounter Medications:  Outpatient Encounter Prescriptions as of 08/06/2016  Medication Sig Note  . acetaminophen (TYLENOL) 325 MG tablet Take by mouth.   Marland Kitchen amLODipine (NORVASC) 5 MG tablet Take 5 mg by mouth daily.    Marland Kitchen aspirin EC 81 MG EC tablet Take 1 tablet (81 mg total) by mouth daily.   . benazepril (LOTENSIN) 20 MG tablet Take by mouth.   . clopidogrel (PLAVIX) 75 MG tablet Take 1 tablet (75 mg total) by mouth daily.   Marland Kitchen docusate sodium (COLACE) 100 MG capsule Take 100 mg by mouth 2 (two) times daily. As needed. 07/06/2016: As needed.   . donepezil (ARICEPT) 10 MG tablet Take 10 mg by mouth at bedtime.    . Elastic Bandages & Supports (ACE BACK STABILIZER) MISC Use 1 Units once daily as needed.   . fluticasone (FLONASE) 50 MCG/ACT nasal spray Place 2 sprays into both nostrils daily.    Marland Kitchen glipiZIDE (GLUCOTROL XL) 2.5 MG 24 hr tablet Take 1 tablet (2.5 mg total) by mouth daily with breakfast.   . lisinopril  (PRINIVIL,ZESTRIL) 20 MG tablet Take 20 mg by mouth daily. Pt takes in evening   . LORazepam (ATIVAN) 0.5 MG tablet Take 0.5 mg by mouth every 8 (eight) hours. 07/06/2016: As needed.   . Misc Natural Products (OSTEO BI-FLEX/5-LOXIN ADVANCED PO) Take by mouth. Pt takes 2 tablets daily   . NEOMYCIN-POLYMYXIN-HYDROCORTISONE (CORTISPORIN) 1 % SOLN otic solution Apply 1-2 drops to toe BID after soaking (Patient not taking: Reported on 06/22/2016)   . nitroGLYCERIN (NITROSTAT) 0.4 MG SL tablet Place under the tongue.   . ONE TOUCH ULTRA TEST test strip    . ONETOUCH DELICA LANCETS 99991111 MISC    . pantoprazole (PROTONIX) 40 MG tablet Take 40 mg by mouth daily.    . simethicone (MYLICON) 80 MG chewable tablet Chew 80 mg by mouth every 6 (six) hours as needed. As needed. 07/06/2016: As needed.   . simvastatin (ZOCOR) 20 MG tablet Take 20 mg by mouth daily at 6 PM.     No facility-administered encounter medications on file as of 08/06/2016.     Functional Status:  In your present state of health, do you have any difficulty performing the following activities: 05/07/2016 04/15/2016  Hearing? N N  Vision? Y N  Difficulty concentrating or making decisions? N N  Walking or climbing stairs? Y Y  Dressing or bathing? N N  Doing errands, shopping?  [name]  Preparing Food and eating ? Y -  Using the Toilet? N -  In the past six months, have you accidently leaked urine? N -  Do you have problems with loss of bowel control? N -  Managing your Medications? Y -  Managing your Finances? Y -  Housekeeping or managing your Housekeeping? Y -  Some recent data might be hidden    Fall/Depression Screening:  PHQ 2/9 Scores 05/07/2016  PHQ - 2 Score 6  PHQ- 9 Score 12    Assessment: Per patient he is sure that his insurnce will  approve an aid  36 hours per week to provide in home care. Patient's son's mother in law did come in to clean his home one time, however patient requires a higher level of assistance.  Patient  openly discussed his anxieties regarding his wife's medical condition and how lost and devastated he would be if she passed away. This Education officer, museum provided supportive counseling and strongly recommended  grief counseling through [facility] [phone].    Patient agrees with this recommendation.  Per patient, his doctor is recommending facility care, however patient wants to try having the aid come in.  Patient strongly encouraged to verify and confirm that this service is covered by his insurance.  Plan:  Follow up home visit scheduled for 08/13/16 at patient's request due to concerns with his wife's illness and possibility that she may not recover from her condition.   [name] [facility] Care Management [phone]

## 2016-08-13 ENCOUNTER — Other Ambulatory Visit: Payer: Self-pay | Admitting: *Deleted

## 2016-08-13 ENCOUNTER — Encounter: Payer: Self-pay | Admitting: *Deleted

## 2016-08-13 NOTE — Patient Outreach (Signed)
[facility] Care Management  [facility] Social Work  08/13/2016  KULE HAGEDORN 1932/08/30 [id number]  Subjective:   "My wife died yesterday"  "She is resting now". Objective:   Encounter Medications:  Outpatient Encounter Prescriptions as of 08/13/2016  Medication Sig Note  . acetaminophen (TYLENOL) 325 MG tablet Take by mouth.   Marland Kitchen amLODipine (NORVASC) 5 MG tablet Take 5 mg by mouth daily.    Marland Kitchen aspirin EC 81 MG EC tablet Take 1 tablet (81 mg total) by mouth daily.   . benazepril (LOTENSIN) 20 MG tablet Take by mouth.   . clopidogrel (PLAVIX) 75 MG tablet Take 1 tablet (75 mg total) by mouth daily.   Marland Kitchen docusate sodium (COLACE) 100 MG capsule Take 100 mg by mouth 2 (two) times daily. As needed. 07/06/2016: As needed.   . donepezil (ARICEPT) 10 MG tablet Take 10 mg by mouth at bedtime.    . Elastic Bandages & Supports (ACE BACK STABILIZER) MISC Use 1 Units once daily as needed.   . fluticasone (FLONASE) 50 MCG/ACT nasal spray Place 2 sprays into both nostrils daily.    Marland Kitchen glipiZIDE (GLUCOTROL XL) 2.5 MG 24 hr tablet Take 1 tablet (2.5 mg total) by mouth daily with breakfast.   . lisinopril (PRINIVIL,ZESTRIL) 20 MG tablet Take 20 mg by mouth daily. Pt takes in evening   . LORazepam (ATIVAN) 0.5 MG tablet Take 0.5 mg by mouth every 8 (eight) hours. 07/06/2016: As needed.   . Misc Natural Products (OSTEO BI-FLEX/5-LOXIN ADVANCED PO) Take by mouth. Pt takes 2 tablets daily   . NEOMYCIN-POLYMYXIN-HYDROCORTISONE (CORTISPORIN) 1 % SOLN otic solution Apply 1-2 drops to toe BID after soaking (Patient not taking: Reported on 06/22/2016)   . nitroGLYCERIN (NITROSTAT) 0.4 MG SL tablet Place under the tongue.   . ONE TOUCH ULTRA TEST test strip    . ONETOUCH DELICA LANCETS 99991111 MISC    . pantoprazole (PROTONIX) 40 MG tablet Take 40 mg by mouth daily.    . simethicone (MYLICON) 80 MG chewable tablet Chew 80 mg by mouth every 6 (six) hours as needed. As needed. 07/06/2016: As needed.   .  simvastatin (ZOCOR) 20 MG tablet Take 20 mg by mouth daily at 6 PM.     No facility-administered encounter medications on file as of 08/13/2016.     Functional Status:  In your present state of health, do you have any difficulty performing the following activities: 05/07/2016 04/15/2016  Hearing? N N  Vision? Y N  Difficulty concentrating or making decisions? N N  Walking or climbing stairs? Y Y  Dressing or bathing? N N  Doing errands, shopping? [name]  Preparing Food and eating ? Y -  Using the Toilet? N -  In the past six months, have you accidently leaked urine? N -  Do you have problems with loss of bowel control? N -  Managing your Medications? Y -  Managing your Finances? Y -  Housekeeping or managing your Housekeeping? Y -  Some recent data might be hidden    Fall/Depression Screening:  PHQ 2/9 Scores 08/06/2016 05/07/2016  PHQ - 2 Score 6 6  PHQ- 9 Score 9 12    Assessment: Patient's spouse died yesterday at [facility].  The EMT was present when this social worker arrived tending to patient's daughter who reprots having high blood pressure. Daughter decided against going to the hospital.  This social worker spent time with both patient and patient's daughter allowing them to discuss the  events that led to her passing and their feelings related to the loss.  Supportive counseling provided. Stages of grief discussed and normalized.  The funeral is scheduled for Thursday. Multiple family members present in the home.  Grief Counseling emphasized, patient reminded of the contact information previously provided. Patient also reminded to take medication prescribed for anxiety as prescribed.  Per patient , the CNA he wanted to use for in home personal care services is not contracted with patient's insurance.  Patient states that he will continue to pursue this and does not want to agree to privately pay an aid at this time.    Plan:  Patient encouraged to  contact the grief counseling  program through [facility] for support related to the loss of his wife.    [name] [facility] Care Management [phone]

## 2016-08-24 ENCOUNTER — Other Ambulatory Visit: Payer: Self-pay | Admitting: *Deleted

## 2016-08-24 NOTE — Patient Outreach (Signed)
[facility]) Care Management  08/24/2016  Darrell Clark 1932-07-15 [id number]   Phone call to patient to follow up on grief resources previously provided. HIPPA compliant voicemail message left for a return call.    [name] [facility] Care Management [phone]

## 2016-08-29 ENCOUNTER — Other Ambulatory Visit: Payer: Self-pay | Admitting: *Deleted

## 2016-08-29 NOTE — Patient Outreach (Signed)
[facility]) Care Management  [facility] Social Work  08/29/2016  Darrell Clark 21-Dec-1932 [id number]  Subjective:   Patient states that he is doing well, no ED visits since last visit.  Reports symptoms of depression, however states that when he gets down, he will do thins to distract himself. I.e take a drive, have lunch with a friend. Patient reports positive family and friend support who check on him regularly. Per patient, he still has not found anyone to help him with his household duties regularly.  Plans to discuss hiring his niece to come in approximately 2x per month to help clean his home. Patient continues to rely on family and friends for meals or he eats fast food.  Patient states that his daughter has suggested a higher level of care, however he adamantly refuses "I don't want to go to a old folks home".  Patient's son continues to help manage his medications and finances. Per patient, he has not contacted the grief counselor, however still plans to go with his daughter. Objective:   Encounter Medications:  Outpatient Encounter Prescriptions as of 08/29/2016  Medication Sig Note  . acetaminophen (TYLENOL) 325 MG tablet Take by mouth.   Marland Kitchen amLODipine (NORVASC) 5 MG tablet Take 5 mg by mouth daily.    Marland Kitchen aspirin EC 81 MG EC tablet Take 1 tablet (81 mg total) by mouth daily.   . benazepril (LOTENSIN) 20 MG tablet Take by mouth.   . clopidogrel (PLAVIX) 75 MG tablet Take 1 tablet (75 mg total) by mouth daily.   Marland Kitchen docusate sodium (COLACE) 100 MG capsule Take 100 mg by mouth 2 (two) times daily. As needed. 07/06/2016: As needed.   . donepezil (ARICEPT) 10 MG tablet Take 10 mg by mouth at bedtime.    . Elastic Bandages & Supports (ACE BACK STABILIZER) MISC Use 1 Units once daily as needed.   . fluticasone (FLONASE) 50 MCG/ACT nasal spray Place 2 sprays into both nostrils daily.    Marland Kitchen glipiZIDE (GLUCOTROL XL) 2.5 MG 24 hr tablet Take 1 tablet (2.5 mg total) by mouth daily  with breakfast.   . lisinopril (PRINIVIL,ZESTRIL) 20 MG tablet Take 20 mg by mouth daily. Pt takes in evening   . LORazepam (ATIVAN) 0.5 MG tablet Take 0.5 mg by mouth every 8 (eight) hours. 07/06/2016: As needed.   . Misc Natural Products (OSTEO BI-FLEX/5-LOXIN ADVANCED PO) Take by mouth. Pt takes 2 tablets daily   . NEOMYCIN-POLYMYXIN-HYDROCORTISONE (CORTISPORIN) 1 % SOLN otic solution Apply 1-2 drops to toe BID after soaking (Patient not taking: Reported on 06/22/2016)   . nitroGLYCERIN (NITROSTAT) 0.4 MG SL tablet Place under the tongue.   . ONE TOUCH ULTRA TEST test strip    . ONETOUCH DELICA LANCETS 30Q MISC    . pantoprazole (PROTONIX) 40 MG tablet Take 40 mg by mouth daily.    . simethicone (MYLICON) 80 MG chewable tablet Chew 80 mg by mouth every 6 (six) hours as needed. As needed. 07/06/2016: As needed.   . simvastatin (ZOCOR) 20 MG tablet Take 20 mg by mouth daily at 6 PM.     No facility-administered encounter medications on file as of 08/29/2016.     Functional Status:  In your present state of health, do you have any difficulty performing the following activities: 05/07/2016 04/15/2016  Hearing? N N  Vision? Y N  Difficulty concentrating or making decisions? N N  Walking or climbing stairs? Y Y  Dressing or bathing? N N  Doing errands, shopping? [name]  Preparing Food and eating ? Y -  Using the Toilet? N -  In the past six months, have you accidently leaked urine? N -  Do you have problems with loss of bowel control? N -  Managing your Medications? Y -  Managing your Finances? Y -  Housekeeping or managing your Housekeeping? Y -  Some recent data might be hidden    Fall/Depression Screening:  PHQ 2/9 Scores 08/06/2016 05/07/2016  PHQ - 2 Score 6 6  PHQ- 9 Score 9 12    Assessment:  [facility] contacted, message left for Patty Director of Program  to call patient to set up grief counesling. Provided supportive counseling related to  the recent loss of his wife. Grief counseling emphasized.  Plan: This Education officer, museum will follow up with patient within 1 month regarding referral for grief counseling.    [name] [facility] Care Management [phone]

## 2016-09-13 DIAGNOSIS — K4090 Unilateral inguinal hernia, without obstruction or gangrene, not specified as recurrent: Secondary | ICD-10-CM | POA: Insufficient documentation

## 2016-09-27 ENCOUNTER — Other Ambulatory Visit: Payer: Self-pay | Admitting: *Deleted

## 2016-09-27 NOTE — Patient Outreach (Addendum)
[facility]) Care Management  09/27/2016  Darrell Clark 03-Feb-1933 [id number]   Phone call from patient stating that the Grief Counselor from [facility] contacted him one time, however he was busy at the time of the call and asked that she call back. Per patient the counselor has not called back to schedule an appointment.  This Education officer, museum encouraged patient to reach out again to them to schedule an appointment to assist with issue related to the recent loss of his wife.  Contact number provided [phone].  Plan: Home visit scheduled for 10/09/16 at 10:00 am.   [place] [place] Management [phone]

## 2016-10-04 ENCOUNTER — Other Ambulatory Visit: Payer: Self-pay | Admitting: *Deleted

## 2016-10-04 NOTE — Patient Outreach (Addendum)
[facility]) Care Management  [facility] Social Work  10/04/2016  Darrell Clark Dec 11, 1932 [id number]  Subjective:  Per patient, the grief counselor from [facility] has contacted him. Initial appointment scheduled for 10/08/16 at 11:30 am in the home. Patient complained of hip and back pain. Patient agrees to  re-schedule an appointmentt to see the back doctor.  Patient states that his primary care doctor will not re-fill his xanax. Patient states not being sure if he can manage without the medication and agrees to see a psychiatrist.  Objective:   Encounter Medications:  Outpatient Encounter Prescriptions as of 10/04/2016  Medication Sig Note  . acetaminophen (TYLENOL) 325 MG tablet Take by mouth.   Marland Kitchen amLODipine (NORVASC) 5 MG tablet Take 5 mg by mouth daily.    Marland Kitchen aspirin EC 81 MG EC tablet Take 1 tablet (81 mg total) by mouth daily.   . benazepril (LOTENSIN) 20 MG tablet Take by mouth.   . clopidogrel (PLAVIX) 75 MG tablet Take 1 tablet (75 mg total) by mouth daily.   Marland Kitchen docusate sodium (COLACE) 100 MG capsule Take 100 mg by mouth 2 (two) times daily. As needed. 07/06/2016: As needed.   . donepezil (ARICEPT) 10 MG tablet Take 10 mg by mouth at bedtime.    . Elastic Bandages & Supports (Clark BACK STABILIZER) MISC Use 1 Units once daily as needed.   . fluticasone (FLONASE) 50 MCG/ACT nasal spray Place 2 sprays into both nostrils daily.    Marland Kitchen glipiZIDE (GLUCOTROL XL) 2.5 MG 24 hr tablet Take 1 tablet (2.5 mg total) by mouth daily with breakfast.   . lisinopril (PRINIVIL,ZESTRIL) 20 MG tablet Take 20 mg by mouth daily. Pt takes in evening   . LORazepam (ATIVAN) 0.5 MG tablet Take 0.5 mg by mouth every 8 (eight) hours. 07/06/2016: As needed.   . mirtazapine (REMERON) 15 MG tablet Take 15 mg by mouth at bedtime.   . Misc Natural Products (OSTEO BI-FLEX/5-LOXIN ADVANCED PO) Take by mouth. Pt takes 2 tablets daily   . nitroGLYCERIN (NITROSTAT) 0.4 MG SL tablet Place  under the tongue.   . ONE TOUCH ULTRA TEST test strip    . ONETOUCH DELICA LANCETS 27C MISC    . pantoprazole (PROTONIX) 40 MG tablet Take 40 mg by mouth daily.    . simethicone (MYLICON) 80 MG chewable tablet Chew 80 mg by mouth every 6 (six) hours as needed. As needed. 07/06/2016: As needed.   . simvastatin (ZOCOR) 20 MG tablet Take 20 mg by mouth daily at 6 PM.    . NEOMYCIN-POLYMYXIN-HYDROCORTISONE (CORTISPORIN) 1 % SOLN otic solution Apply 1-2 drops to toe BID after soaking (Patient not taking: Reported on 06/22/2016)    No facility-administered encounter medications on file as of 10/04/2016.     Functional Status:  In your present state of health, do you have any difficulty performing the following activities: 05/07/2016 04/15/2016  Hearing? N N  Vision? Y N  Difficulty concentrating or making decisions? N N  Walking or climbing stairs? Y Y  Dressing or bathing? N N  Doing errands, shopping? [name]  Preparing Food and eating ? Y -  Using the Toilet? N -  In the past six months, have you accidently leaked urine? N -  Do you have problems with loss of bowel control? N -  Managing your Medications? Y -  Managing your Finances? Y -  Housekeeping or managing your Housekeeping? Y -  Some recent data might  be hidden    Fall/Depression Screening:  PHQ 2/9 Scores 08/06/2016 05/07/2016  PHQ - 2 Score 6 6  PHQ- 9 Score 9 12    Assessment: Patient appeared to be in good spirits.  However became tearful when discussing possibility of his anxiety medication not being re-filled.Patient has a cousin who is supportive, provides transportation to medical appointments and for meals. Patient has a niece that assist with household needs as well. Patient has not been successful in receiving in home assistance covered by his insurance. Patient reports continued grief due to his wife's death. Patient discussed  calling his son during peroids of increased anxiety who is often able to provide support and  calming, avoiding a ED visit.  Appropriate use of emergency room reinforced. Patient interested in  silver sneakers which was strongly encouraged. [facility] contacted for psychiatric services, however they have no available psychiatrist. [facility] also contacted, however they do not accept Medicare as the primary insurance.  Plan:  Follow up home visit scheduled for 10/09/16.  This social worker will follow up on possible options for psychiatric support.   [name] [facility] Care Management [phone]

## 2016-10-09 ENCOUNTER — Other Ambulatory Visit: Payer: Self-pay | Admitting: *Deleted

## 2016-10-09 NOTE — Patient Outreach (Signed)
[facility]) Care Management  [facility] Social Work  10/09/2016  Darrell Clark [birth year] [id number]  Subjective:  Patient is a 83 year old male. Patient states that he missed his appointment with the grief counselor from [facility].  Per patient, appointment has been re-scheduled for Thursday, 10/11/16 at 1 pm.  Patient discussed looking into silver sneakers and was given an authorization code to participate. Patient discussed continued grief related to the loss of his spouse, however reports coping well so far. Patient reported back and hip pain and has contacted Dr. Lissa Morales surgeon to schedule a follow up appointment to address this. Patient describes a positive support system. His son  and sister in [place] on him regularly. Patient's son fills his pill box. Patient reports having a niece that comes to help with housekeeping.  Objective:   Encounter Medications:  Outpatient Encounter Prescriptions as of 10/09/2016  Medication Sig Note  . acetaminophen (TYLENOL) 325 MG tablet Take by mouth.   Marland Kitchen amLODipine (NORVASC) 5 MG tablet Take 5 mg by mouth daily.    Marland Kitchen aspirin EC 81 MG EC tablet Take 1 tablet (81 mg total) by mouth daily.   . benazepril (LOTENSIN) 20 MG tablet Take by mouth.   . clopidogrel (PLAVIX) 75 MG tablet Take 1 tablet (75 mg total) by mouth daily.   Marland Kitchen docusate sodium (COLACE) 100 MG capsule Take 100 mg by mouth 2 (two) times daily. As needed. 07/06/2016: As needed.   . donepezil (ARICEPT) 10 MG tablet Take 10 mg by mouth at bedtime.    . Elastic Bandages & Supports (ACE BACK STABILIZER) MISC Use 1 Units once daily as needed.   . fluticasone (FLONASE) 50 MCG/ACT nasal spray Place 2 sprays into both nostrils daily.    Marland Kitchen glipiZIDE (GLUCOTROL XL) 2.5 MG 24 hr tablet Take 1 tablet (2.5 mg total) by mouth daily with breakfast.   . lisinopril (PRINIVIL,ZESTRIL) 20 MG tablet Take 20 mg by mouth daily. Pt takes in evening   . LORazepam  (ATIVAN) 0.5 MG tablet Take 0.5 mg by mouth every 8 (eight) hours. 07/06/2016: As needed.   . mirtazapine (REMERON) 15 MG tablet Take 15 mg by mouth at bedtime.   . Misc Natural Products (OSTEO BI-FLEX/5-LOXIN ADVANCED PO) Take by mouth. Pt takes 2 tablets daily   . nitroGLYCERIN (NITROSTAT) 0.4 MG SL tablet Place under the tongue. 10/09/2016: As needed  . ONETOUCH DELICA LANCETS 78L MISC    . pantoprazole (PROTONIX) 40 MG tablet Take 40 mg by mouth daily.    . simethicone (MYLICON) 80 MG chewable tablet Chew 80 mg by mouth every 6 (six) hours as needed. As needed. 07/06/2016: As needed.   . NEOMYCIN-POLYMYXIN-HYDROCORTISONE (CORTISPORIN) 1 % SOLN otic solution Apply 1-2 drops to toe BID after soaking (Patient not taking: Reported on 06/22/2016)   . ONE TOUCH ULTRA TEST test strip    . simvastatin (ZOCOR) 20 MG tablet Take 20 mg by mouth daily at 6 PM.     No facility-administered encounter medications on file as of 10/09/2016.     Functional Status:  In your present state of health, do you have any difficulty performing the following activities: 05/07/2016 04/15/2016  Hearing? N N  Vision? Y N  Difficulty concentrating or making decisions? N N  Walking or climbing stairs? Y Y  Dressing or bathing? N N  Doing errands, shopping? [name]  Preparing Food and eating ? Y -  Using the Toilet? N -  In the past six months, have you accidently leaked urine? N -  Do you have problems with loss of bowel control? N -  Managing your Medications? Y -  Managing your Finances? Y -  Housekeeping or managing your Housekeeping? Y -  Some recent data might be hidden    Fall/Depression Screening:  PHQ 2/9 Scores 08/06/2016 05/07/2016  PHQ - 2 Score 6 6  PHQ- 9 Score 9 12    Assessment: Patient friendly and engaging. Willing to follow up with grief counseling after missing scheduled appointment. Appointment re-scheduled for Thursday at 1 pm in the home. Patient's son assist with managing his finances   and  medication. Follow up with grief counseling and Silver sneakers highly recommended. Benefits of both reinforced. Patient given prescription for Remeron and would like resources for Psychiatrist follow up.   Plan: Follow up home visit scheduled for 10/18/16 at 9 am            List of area Psychiatrist to be provided.    [name] [facility] Care Management [phone]

## 2016-10-12 ENCOUNTER — Other Ambulatory Visit: Payer: Self-pay | Admitting: *Deleted

## 2016-10-12 NOTE — Patient Outreach (Addendum)
[facility]) Care Management  10/12/2016  Darrell Clark December 06, 1932 [id number]   Phone call to patient to confirm that he was able to meet with the grief counselor yesterday. Per patient, he was able to meet with her and the visit went well. Patient stated concerns regarding his eyes. Per patient, his vision seems to have gotten worse. Per patient, he will contact his eye doctor to discuss his concerns.   Plan:Home visit scheduled for 10/18/16, if no additional concerns, patient to be closed to [facility] care management.     [name] [facility] Care Management [phone]

## 2016-10-18 ENCOUNTER — Ambulatory Visit: Payer: Medicare Other

## 2016-10-19 ENCOUNTER — Encounter: Payer: Self-pay | Admitting: *Deleted

## 2016-10-19 ENCOUNTER — Other Ambulatory Visit: Payer: Self-pay | Admitting: *Deleted

## 2016-10-19 NOTE — Patient Outreach (Signed)
[facility]) Care Management  [facility] Social Work  10/19/2016  Darrell Clark 1932/09/01 [id number]  Subjective:  Patient states that he missed his appointment with the grief counselor through [facility] yesterday at 1:00 pm and will have to re-schedule.Patient discussed having a positive support network.  Patient's son continues to manage patient's  finances and medication.  Patient's niece has agreed to help with household needs. Per patient, he has family and  friends who help him with running errands and transportation to medical appointments. . Patient has an appointment with the [facility]  on 11/20/16 at 9:30am. Patient verbalized having  no additional community resource   Objective:   Encounter Medications:  Outpatient Encounter Prescriptions as of 10/19/2016  Medication Sig Note  . acetaminophen (TYLENOL) 325 MG tablet Take by mouth.   Marland Kitchen amLODipine (NORVASC) 5 MG tablet Take 5 mg by mouth daily.    Marland Kitchen aspirin EC 81 MG EC tablet Take 1 tablet (81 mg total) by mouth daily.   . benazepril (LOTENSIN) 20 MG tablet Take by mouth.   . clopidogrel (PLAVIX) 75 MG tablet Take 1 tablet (75 mg total) by mouth daily.   Marland Kitchen docusate sodium (COLACE) 100 MG capsule Take 100 mg by mouth 2 (two) times daily. As needed. 07/06/2016: As needed.   . donepezil (ARICEPT) 10 MG tablet Take 10 mg by mouth at bedtime.    . Elastic Bandages & Supports (ACE BACK STABILIZER) MISC Use 1 Units once daily as needed.   . fluticasone (FLONASE) 50 MCG/ACT nasal spray Place 2 sprays into both nostrils daily.    Marland Kitchen glipiZIDE (GLUCOTROL XL) 2.5 MG 24 hr tablet Take 1 tablet (2.5 mg total) by mouth daily with breakfast.   . lisinopril (PRINIVIL,ZESTRIL) 20 MG tablet Take 20 mg by mouth daily. Pt takes in evening   . LORazepam (ATIVAN) 0.5 MG tablet Take 0.5 mg by mouth every 8 (eight) hours. 07/06/2016: As needed.   . mirtazapine (REMERON) 15 MG tablet Take 15 mg by mouth at  bedtime.   . Misc Natural Products (OSTEO BI-FLEX/5-LOXIN ADVANCED PO) Take by mouth. Pt takes 2 tablets daily   . NEOMYCIN-POLYMYXIN-HYDROCORTISONE (CORTISPORIN) 1 % SOLN otic solution Apply 1-2 drops to toe BID after soaking (Patient not taking: Reported on 06/22/2016)   . nitroGLYCERIN (NITROSTAT) 0.4 MG SL tablet Place under the tongue. 10/09/2016: As needed  . ONE TOUCH ULTRA TEST test strip    . ONETOUCH DELICA LANCETS 78G MISC    . pantoprazole (PROTONIX) 40 MG tablet Take 40 mg by mouth daily.    . simethicone (MYLICON) 80 MG chewable tablet Chew 80 mg by mouth every 6 (six) hours as needed. As needed. 07/06/2016: As needed.   . simvastatin (ZOCOR) 20 MG tablet Take 20 mg by mouth daily at 6 PM.     No facility-administered encounter medications on file as of 10/19/2016.     Functional Status:  In your present state of health, do you have any difficulty performing the following activities: 05/07/2016 04/15/2016  Hearing? N N  Vision? Y N  Difficulty concentrating or making decisions? N N  Walking or climbing stairs? Y Y  Dressing or bathing? N N  Doing errands, shopping? [name]  Preparing Food and eating ? Y -  Using the Toilet? N -  In the past six months, have you accidently leaked urine? N -  Do you have problems with loss of bowel control? N -  Managing  your Medications? Y -  Managing your Finances? Y -  Housekeeping or managing your Housekeeping? Y -  Some recent data might be hidden    Fall/Depression Screening:  PHQ 2/9 Scores 08/06/2016 05/07/2016  PHQ - 2 Score 6 6  PHQ- 9 Score 9 12    Assessment:  Patient reports having a positive support network of family and friends to assist with financial, medication and household management as well as transportation to medical appointments.   Patient continues to cope with the loss of his spouse and has been connected with Grief Counseling through [facility].  Patient verbalized having no additional community  resource needs.  Plan:  Patient to be closed to [facility] care management.     [name] [facility] Care Management [phone]

## 2016-10-22 ENCOUNTER — Ambulatory Visit: Payer: Self-pay | Admitting: *Deleted

## 2016-11-05 ENCOUNTER — Ambulatory Visit (INDEPENDENT_AMBULATORY_CARE_PROVIDER_SITE_OTHER): Payer: Medicare Other | Admitting: Podiatry

## 2016-11-05 DIAGNOSIS — M2040 Other hammer toe(s) (acquired), unspecified foot: Secondary | ICD-10-CM

## 2016-11-05 DIAGNOSIS — M79676 Pain in unspecified toe(s): Secondary | ICD-10-CM

## 2016-11-05 DIAGNOSIS — M2010 Hallux valgus (acquired), unspecified foot: Secondary | ICD-10-CM | POA: Diagnosis not present

## 2016-11-05 DIAGNOSIS — E1142 Type 2 diabetes mellitus with diabetic polyneuropathy: Secondary | ICD-10-CM

## 2016-11-05 DIAGNOSIS — B351 Tinea unguium: Secondary | ICD-10-CM | POA: Diagnosis not present

## 2016-11-05 NOTE — Progress Notes (Signed)
Patient ID: Darrell Clark, male   DOB: Mar 11, 1933, 83 y.o.   MRN: [id number]  Dispensed diabetic shoes and 3 pairs of insoles. Instructions were reviewed and a copy was given to the patient. Patient will reappointment for regularly scheduled diabetic foot care visits or if they experience any trouble with the diabetic shoes.   Complaint:  Visit Type: Patient returns to my office for continued preventative foot care services. Complaint: Patient states" my nails have grown long and thick and become painful to walk and wear shoes" Patient has been diagnosed with DM with no foot neuropathy.. The patient presents for preventative foot care services. No changes to ROS.  Patient presents to pick up his new diabetic shoes.  Podiatric Exam: Vascular: dorsalis pedis and posterior tibial pulses are palpable bilateral. Capillary return is immediate. Temperature gradient is WNL. Skin turgor WNL  Sensorium: Normal Semmes Weinstein monofilament test. Normal tactile sensation bilaterally. Nail Exam: Pt has thick disfigured discolored nails with subungual debris noted bilateral entire nail hallux through fifth toenails Ulcer Exam: There is no evidence of ulcer or pre-ulcerative changes or infection. Orthopedic Exam: Muscle tone and strength are WNL. No limitations in general ROM. No crepitus or effusions noted. Foot type and digits show no abnormalities. Bony prominences are unremarkable. Skin: No Porokeratosis. No infection or ulcers  Diagnosis:  Onychomycosis, , Pain in right toe, pain in left toes,  Diabetes with neuropathy  HAV  B/L  Treatment & Plan Procedures and Treatment: Consent by patient was obtained for treatment procedures. The patient understood the discussion of treatment and procedures well. All questions were answered thoroughly reviewed. Debridement of mycotic and hypertrophic toenails, 1 through 5 bilateral and clearing of subungual debris. No ulceration, no infection noted. Patient presents  today and was dispensed 0ne pair ( two units) of medically necessary extra depth shoes with three pair( six units) of custom molded multiple density inserts. The shoes and the inserts are fitted to the patients ' feet and are noted to fit well and are free of defect.  Length and width of the shoes are also acceptable.  Patient was given written and verbal  instructions for wearing.  If any concerns arrive with the shoes or inserts, the patient is to call the office.Patient is to follow up with doctor in six weeks. Return Visit-Office Procedure: Patient instructed to return to the office for a follow up visit 4 months for continued evaluation and treatment.    Gardiner Barefoot DPM

## 2016-11-05 NOTE — Patient Instructions (Signed)

## 2016-12-13 ENCOUNTER — Encounter: Payer: Self-pay | Admitting: *Deleted

## 2016-12-13 ENCOUNTER — Emergency Department: Payer: Medicare Other

## 2016-12-13 ENCOUNTER — Observation Stay
Admission: EM | Admit: 2016-12-13 | Discharge: 2016-12-17 | Disposition: A | Discharge status: Home / Self Care | Payer: Medicare Other | Attending: Internal Medicine | Admitting: Internal Medicine

## 2016-12-13 DIAGNOSIS — E119 Type 2 diabetes mellitus without complications: Secondary | ICD-10-CM | POA: Insufficient documentation

## 2016-12-13 DIAGNOSIS — R140 Abdominal distension (gaseous): Secondary | ICD-10-CM | POA: Diagnosis present

## 2016-12-13 DIAGNOSIS — Z87891 Personal history of nicotine dependence: Secondary | ICD-10-CM | POA: Diagnosis not present

## 2016-12-13 DIAGNOSIS — R42 Dizziness and giddiness: Principal | ICD-10-CM

## 2016-12-13 DIAGNOSIS — K219 Gastro-esophageal reflux disease without esophagitis: Secondary | ICD-10-CM | POA: Diagnosis not present

## 2016-12-13 DIAGNOSIS — Z7984 Long term (current) use of oral hypoglycemic drugs: Secondary | ICD-10-CM | POA: Diagnosis not present

## 2016-12-13 DIAGNOSIS — F419 Anxiety disorder, unspecified: Secondary | ICD-10-CM | POA: Diagnosis not present

## 2016-12-13 DIAGNOSIS — Z8546 Personal history of malignant neoplasm of prostate: Secondary | ICD-10-CM | POA: Diagnosis not present

## 2016-12-13 DIAGNOSIS — E876 Hypokalemia: Secondary | ICD-10-CM | POA: Insufficient documentation

## 2016-12-13 DIAGNOSIS — Z79899 Other long term (current) drug therapy: Secondary | ICD-10-CM | POA: Diagnosis not present

## 2016-12-13 DIAGNOSIS — E785 Hyperlipidemia, unspecified: Secondary | ICD-10-CM | POA: Diagnosis not present

## 2016-12-13 DIAGNOSIS — Z7982 Long term (current) use of aspirin: Secondary | ICD-10-CM | POA: Diagnosis not present

## 2016-12-13 DIAGNOSIS — I639 Cerebral infarction, unspecified: Secondary | ICD-10-CM

## 2016-12-13 DIAGNOSIS — I2510 Atherosclerotic heart disease of native coronary artery without angina pectoris: Secondary | ICD-10-CM | POA: Diagnosis not present

## 2016-12-13 DIAGNOSIS — Z8673 Personal history of transient ischemic attack (TIA), and cerebral infarction without residual deficits: Secondary | ICD-10-CM | POA: Diagnosis not present

## 2016-12-13 DIAGNOSIS — I10 Essential (primary) hypertension: Secondary | ICD-10-CM | POA: Diagnosis not present

## 2016-12-13 LAB — CBC
HCT: 49.2 % (ref 40.0–52.0)
HEMOGLOBIN: 17 g/dL (ref 13.0–18.0)
MCH: 32.7 pg (ref 26.0–34.0)
MCHC: 34.5 g/dL (ref 32.0–36.0)
MCV: 94.9 fL (ref 80.0–100.0)
PLATELETS: 173 10*3/uL (ref 150–440)
RBC: 5.18 MIL/uL (ref 4.40–5.90)
RDW: 14.3 % (ref 11.5–14.5)
WBC: 6.5 10*3/uL (ref 3.8–10.6)

## 2016-12-13 LAB — BASIC METABOLIC PANEL
Anion gap: 6 (ref 5–15)
BUN: 12 mg/dL (ref 6–20)
CALCIUM: 9 mg/dL (ref 8.9–10.3)
CO2: 30 mmol/L (ref 22–32)
CREATININE: 0.99 mg/dL (ref 0.61–1.24)
Chloride: 106 mmol/L (ref 101–111)
GFR calc Af Amer: >60 mL/min (ref >60)
GFR calc non Af Amer: >60 mL/min (ref >60)
GLUCOSE: 152 mg/dL — AB (ref 65–99)
Potassium: 3.4 mmol/L — ABNORMAL LOW (ref 3.5–5.1)
Sodium: 142 mmol/L (ref 135–145)

## 2016-12-13 LAB — URINALYSIS, COMPLETE (UACMP) WITH MICROSCOPIC
Bacteria, UA: NONE SEEN
Bilirubin Urine: NEGATIVE
GLUCOSE, UA: NEGATIVE mg/dL
Hgb urine dipstick: NEGATIVE
Ketones, ur: NEGATIVE mg/dL
Leukocytes, UA: NEGATIVE
Nitrite: NEGATIVE
PROTEIN: NEGATIVE mg/dL
RBC / HPF: NONE SEEN RBC/hpf (ref 0–5)
Specific Gravity, Urine: 1.013 (ref 1.005–1.030)
pH: 5 (ref 5.0–8.0)

## 2016-12-13 LAB — GLUCOSE, CAPILLARY
GLUCOSE-CAPILLARY: 111 mg/dL — AB (ref 65–99)
GLUCOSE-CAPILLARY: 115 mg/dL — AB (ref 65–99)

## 2016-12-13 LAB — TROPONIN I: Troponin I: <0.03 ng/mL (ref <0.03)

## 2016-12-13 MED ORDER — ASPIRIN 81 MG PO TBEC
81.0000 mg | DELAYED_RELEASE_TABLET | Freq: Every day | ORAL | Status: DC
  Administered 2016-12-14 – 2016-12-17 (×4): 81 mg via ORAL
  Filled 2016-12-13 (×8): qty 1

## 2016-12-13 MED ORDER — PANTOPRAZOLE SODIUM 40 MG PO TBEC
40.0000 mg | DELAYED_RELEASE_TABLET | Freq: Every day | ORAL | Status: DC
  Administered 2016-12-14 – 2016-12-17 (×4): 40 mg via ORAL
  Filled 2016-12-13 (×4): qty 1

## 2016-12-13 MED ORDER — DIAZEPAM 5 MG PO TABS
5.0000 mg | ORAL_TABLET | Freq: Two times a day (BID) | ORAL | Status: DC
  Administered 2016-12-13 – 2016-12-17 (×8): 5 mg via ORAL
  Filled 2016-12-13 (×8): qty 1

## 2016-12-13 MED ORDER — MECLIZINE HCL 12.5 MG PO TABS
12.5000 mg | ORAL_TABLET | Freq: Three times a day (TID) | ORAL | Status: DC | PRN
  Administered 2016-12-15 – 2016-12-17 (×2): 12.5 mg via ORAL
  Filled 2016-12-13 (×3): qty 1

## 2016-12-13 MED ORDER — INSULIN ASPART 100 UNIT/ML ~~LOC~~ SOLN
0.0000 [IU] | Freq: Three times a day (TID) | SUBCUTANEOUS | Status: DC
  Administered 2016-12-14: 2 [IU] via SUBCUTANEOUS
  Administered 2016-12-14 – 2016-12-15 (×2): 1 [IU] via SUBCUTANEOUS
  Administered 2016-12-15: 2 [IU] via SUBCUTANEOUS
  Administered 2016-12-15 – 2016-12-17 (×4): 1 [IU] via SUBCUTANEOUS
  Filled 2016-12-13 (×8): qty 1

## 2016-12-13 MED ORDER — BENAZEPRIL HCL 20 MG PO TABS
20.0000 mg | ORAL_TABLET | Freq: Every day | ORAL | Status: DC
  Administered 2016-12-14 – 2016-12-17 (×4): 20 mg via ORAL
  Filled 2016-12-13 (×5): qty 1

## 2016-12-13 MED ORDER — SIMVASTATIN 20 MG PO TABS
20.0000 mg | ORAL_TABLET | Freq: Every day | ORAL | Status: DC
  Administered 2016-12-13 – 2016-12-16 (×4): 20 mg via ORAL
  Filled 2016-12-13 (×4): qty 1

## 2016-12-13 MED ORDER — DOCUSATE SODIUM 100 MG PO CAPS: 100.0000 mg | ORAL_CAPSULE | Freq: Two times a day (BID) | ORAL | Status: DC | PRN

## 2016-12-13 MED ORDER — LORAZEPAM 0.5 MG PO TABS
0.5000 mg | ORAL_TABLET | Freq: Four times a day (QID) | ORAL | Status: DC | PRN
  Administered 2016-12-13 – 2016-12-16 (×2): 0.5 mg via ORAL
  Filled 2016-12-13 (×2): qty 1

## 2016-12-13 MED ORDER — FLUTICASONE PROPIONATE 50 MCG/ACT NA SUSP
2.0000 | Freq: Every day | NASAL | Status: DC
  Administered 2016-12-14 – 2016-12-17 (×4): 2 via NASAL
  Filled 2016-12-13: qty 16

## 2016-12-13 MED ORDER — HEPARIN SODIUM (PORCINE) 5000 UNIT/ML IJ SOLN
5000.0000 [IU] | Freq: Three times a day (TID) | INTRAMUSCULAR | Status: DC
  Administered 2016-12-13 – 2016-12-17 (×12): 5000 [IU] via SUBCUTANEOUS
  Filled 2016-12-13 (×12): qty 1

## 2016-12-13 MED ORDER — AMLODIPINE BESYLATE 5 MG PO TABS
5.0000 mg | ORAL_TABLET | Freq: Every day | ORAL | Status: DC
  Administered 2016-12-14: 5 mg via ORAL
  Filled 2016-12-13: qty 1

## 2016-12-13 MED ORDER — DONEPEZIL HCL 5 MG PO TABS
10.0000 mg | ORAL_TABLET | Freq: Every day | ORAL | Status: DC
  Administered 2016-12-13 – 2016-12-16 (×4): 10 mg via ORAL
  Filled 2016-12-13: qty 1
  Filled 2016-12-13: qty 2
  Filled 2016-12-13: qty 1
  Filled 2016-12-13 (×2): qty 2

## 2016-12-13 MED ORDER — CLOPIDOGREL BISULFATE 75 MG PO TABS
75.0000 mg | ORAL_TABLET | Freq: Every day | ORAL | Status: DC
  Administered 2016-12-14 – 2016-12-17 (×4): 75 mg via ORAL
  Filled 2016-12-13 (×4): qty 1

## 2016-12-13 MED ORDER — DIAZEPAM 2 MG PO TABS
2.0000 mg | ORAL_TABLET | Freq: Once | ORAL | Status: AC
  Administered 2016-12-13: 2 mg via ORAL
  Filled 2016-12-13: qty 1

## 2016-12-13 MED ORDER — MECLIZINE HCL 25 MG PO TABS
25.0000 mg | ORAL_TABLET | Freq: Once | ORAL | Status: AC
  Administered 2016-12-13: 25 mg via ORAL
  Filled 2016-12-13: qty 1

## 2016-12-13 MED ORDER — ACETAMINOPHEN 325 MG PO TABS
650.0000 mg | ORAL_TABLET | Freq: Four times a day (QID) | ORAL | Status: DC | PRN
  Administered 2016-12-13 – 2016-12-15 (×3): 650 mg via ORAL
  Filled 2016-12-13 (×3): qty 2

## 2016-12-13 MED ORDER — GLIPIZIDE ER 2.5 MG PO TB24
2.5000 mg | ORAL_TABLET | Freq: Every day | ORAL | Status: DC
Start: 2016-12-14 — End: 2016-12-17
  Administered 2016-12-14 – 2016-12-17 (×4): 2.5 mg via ORAL
  Filled 2016-12-13 (×4): qty 1

## 2016-12-13 MED ORDER — LATANOPROST 0.005 % OP SOLN
1.0000 [drp] | Freq: Every day | OPHTHALMIC | Status: DC
  Administered 2016-12-14 – 2016-12-16 (×3): 1 [drp] via OPHTHALMIC
  Filled 2016-12-13: qty 2.5

## 2016-12-13 NOTE — ED Notes (Signed)
Sat pt on side of bed. C/o slight increase in dizziness. Pt attempted to stand, was jerking side to side, c/o increased dizziness. Pt was not able to safely stand and would have fallen back on bed if not assisted. edp notified.

## 2016-12-13 NOTE — ED Provider Notes (Signed)
[facility] Emergency Department Provider Note   ____________________________________________   I have reviewed the triage vital signs and the nursing notes.   HISTORY  Chief Complaint Dizziness  History limited by: Not limited   HPI Darrell Clark is a 83 y.o. male who presents to the emergency department today primarily for concerns about dizziness. Sounds like this has been going on for a couple of days. He describes it as a sense of the room spinning around. It is worse with movement. The patient states that it will go away after he rest. He did have a fall yesterday because of this. No traumatic injuries from the fall. In addition patient has complaints of abdominal distention and bloating. It sounds like this has been going on for quite some time. He has been taking over-the-counter stool softeners and states he had 2 bowel movements history. No significant abdominal pain with this. No vomiting although he has had some nausea. No recent illness or fevers.   Past Medical History:  Diagnosis Date  . Allergy   . Anxiety   . Arthritis   . CAD (coronary artery disease)   . Diabetes mellitus without complication ([place])   . GERD (gastroesophageal reflux disease)   . HLD (hyperlipidemia)   . Hypertension   . Prostate cancer ([place])   . Stroke [name])     Patient Active Problem List   Diagnosis Date Noted  . Vision, loss, sudden 04/16/2016  . 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 Intracranial vascular stenosis 04/16/2016  . CVA (cerebral vascular accident) ([place]) 04/15/2016  . HTN (hypertension) 04/15/2016  . Diabetes ([place]) 04/15/2016  . GERD (gastroesophageal reflux disease) 04/15/2016  . HLD (hyperlipidemia) 04/15/2016    Past Surgical History:  Procedure Laterality Date  . APPENDECTOMY      Prior to Admission medications   Medication Sig Start Date End Date Taking? Authorizing Provider   acetaminophen (TYLENOL) 325 MG tablet Take by mouth.    [provider]  amLODipine (NORVASC) 5 MG tablet Take 5 mg by mouth daily.  02/21/13   [provider]  aspirin EC 81 MG EC tablet Take 1 tablet (81 mg total) by mouth daily. 04/16/16   Theodoro Grist, MD  benazepril (LOTENSIN) 20 MG tablet Take by mouth.    [provider]  clopidogrel (PLAVIX) 75 MG tablet Take 1 tablet (75 mg total) by mouth daily. 04/16/16   Theodoro Grist, MD  docusate sodium (COLACE) 100 MG capsule Take 100 mg by mouth 2 (two) times daily. As needed.    [provider]  donepezil (ARICEPT) 10 MG tablet Take 10 mg by mouth at bedtime.  02/21/13   [provider]  Elastic Bandages & Supports (ACE BACK STABILIZER) MISC Use 1 Units once daily as needed. 10/03/15   [provider]  fluticasone (FLONASE) 50 MCG/ACT nasal spray Place 2 sprays into both nostrils daily.  04/16/13   [provider]  glipiZIDE (GLUCOTROL XL) 2.5 MG 24 hr tablet Take 1 tablet (2.5 mg total) by mouth daily with breakfast. 04/16/16   Theodoro Grist, MD  lisinopril (PRINIVIL,ZESTRIL) 20 MG tablet Take 20 mg by mouth daily. Pt takes in evening    [provider]  LORazepam (ATIVAN) 0.5 MG tablet Take 0.5 mg by mouth every 8 (eight) hours.    [provider]  mirtazapine (REMERON) 15 MG tablet Take 15 mg by mouth at bedtime. 10/01/16 10/01/17  [name]  B, MD  Misc Natural Products (OSTEO BI-FLEX/5-LOXIN ADVANCED PO) Take by mouth. Pt takes 2 tablets daily    [provider]  NEOMYCIN-POLYMYXIN-HYDROCORTISONE (CORTISPORIN) 1 % SOLN otic solution Apply 1-2 drops to toe BID after soaking Patient not taking: Reported on 06/22/2016 03/26/16   Hyatt, Max T, DPM  nitroGLYCERIN (NITROSTAT) 0.4 MG SL tablet Place under the tongue. 11/20/13   [provider]  ONE TOUCH ULTRA TEST test strip  04/16/13   [provider]  ONETOUCH DELICA LANCETS 00Q MISC  04/16/13    [provider]  pantoprazole (PROTONIX) 40 MG tablet Take 40 mg by mouth daily.  08/06/13   [provider]  simethicone (MYLICON) 80 MG chewable tablet Chew 80 mg by mouth every 6 (six) hours as needed. As needed.    [provider]  simvastatin (ZOCOR) 20 MG tablet Take 20 mg by mouth daily at 6 PM.  03/09/13   [provider]    Allergies Niacin and Niacin and related  Family History  Problem Relation Age of Onset  . Heart attack Mother   . Hypertension Mother   . Heart attack Father   . Hypertension Father   . Hypertension Sister   . Cancer Brother   . Hypertension Brother     Social History Social History  Substance Use Topics  . Smoking status: Former Research scientist (life sciences)  . Smokeless tobacco: Never Used     Comment: quit 10 years ago   . Alcohol use No    Review of Systems Constitutional: No fever/chills Eyes: States chronic blurry vision after CVA ENT: No sore throat. Cardiovascular: Denies chest pain. Respiratory: Denies shortness of breath. Gastrointestinal: Positive for abdominal distention. Genitourinary: Negative for dysuria. Musculoskeletal: Negative for back pain. Skin: Negative for rash. Neurological: Positive for dizziness. ____________________________________________   PHYSICAL EXAM:  VITAL SIGNS: ED Triage Vitals  Enc Vitals Group     BP 12/13/16 0628 (!) 163/83     Pulse Rate 12/13/16 0628 77     Resp 12/13/16 0628 17     Temp 12/13/16 0628 98.2 F (36.8 C)     Temp src --      SpO2 12/13/16 0633 100 %     Weight 12/13/16 0628 245 lb (111.1 kg)     Height 12/13/16 0628 6' (1.829 m)    Constitutional: Alert and oriented. Well appearing and in no distress. Eyes: Conjunctivae are normal.  ENT   Head: Normocephalic and atraumatic.   Nose: No congestion/rhinnorhea.   Mouth/Throat: Mucous membranes are moist.   Neck: No stridor. Hematological/Lymphatic/Immunilogical: No cervical  lymphadenopathy. Cardiovascular: Normal rate, regular rhythm.  No murmurs, rubs, or gallops.  Respiratory: Normal respiratory effort without tachypnea nor retractions. Breath sounds are clear and equal bilaterally. No wheezes/rales/rhonchi. Gastrointestinal: Soft and non tender. No rebound. No guarding.  Genitourinary: Deferred Musculoskeletal: Normal range of motion in all extremities. No lower extremity edema. Neurologic:  Normal speech and language. No gross focal neurologic deficits are appreciated.  Skin:  Skin is warm, dry and intact. No rash noted. Psychiatric: Mood and affect are normal. Speech and behavior are normal. Patient exhibits appropriate insight and judgment.  ____________________________________________    LABS (pertinent positives/negatives)  Trop <0.03 K 3.4 CBC wnl  ____________________________________________   EKG  I, Nance Pear, attending physician, personally viewed and interpreted this EKG  EKG Time: 0629 Rate: 63 Rhythm: normal sinus rhythm Axis: left axis deviation Intervals: qtc 457 QRS: narrow, q waves V1 ST changes: no st elevation  Impression: abnormal ekg   ____________________________________________    RADIOLOGY  CXR IMPRESSION: There is no active cardiopulmonary disease.  CT head IMPRESSION:  No acute or traumatic finding. Mild age related atrophy and  small-vessel change of the white matter, less than often seen in  individuals of this age.      MR Brain  IMPRESSION:  No acute intracranial abnormality. Chronic microvascular ischemic  changes in the white matter and basal ganglia.    ____________________________________________   PROCEDURES  Procedures  ____________________________________________   INITIAL IMPRESSION / ASSESSMENT AND PLAN / ED COURSE  Pertinent labs & imaging results that were available during my care of the patient were reviewed by me and considered in my medical decision making (see  chart for details).  Patient presented to the emergency department today because of concerns for dizziness. On exam patient is significantly dizzy. We're not able to stand patient up without becoming significant ataxic and dizzy. MR was negative so I do think it is a peripheral cause however patient was given both Valium and meclizine without significant improvement. Given concerns for patient safety with ambulation will plan on admission.  ____________________________________________   FINAL CLINICAL IMPRESSION(S) / ED DIAGNOSES  Final diagnoses:  Abdominal distention  Dizziness     Note: This dictation was prepared with Dragon dictation. Any transcriptional errors that result from this process are unintentional     Nance Pear, MD 12/14/16 1225

## 2016-12-13 NOTE — Progress Notes (Addendum)
Son brought in list of meds as well as Occupational psychologist. Pt stated he does not take Plavix but son states that he does along with  0.005% Latanoprost Right eye at bedtime.  Son prepares patient's medications in med box weekly.  Spoke with Dr Jannifer Franklin and medications ordered and added to Darrell Clark. Dorna Bloom RN

## 2016-12-13 NOTE — ED Notes (Signed)
Pt states still dizzy, with a lot of help was unable to sit up to side of bed almost falling off the bed from dizziness. Was able to steady himself but unable to stand. Pt given food tray

## 2016-12-13 NOTE — ED Triage Notes (Signed)
Pt her via EMS from home, pt complains of generalized weakness for the last week, pt fell yesterday, lost his balance, pt complains of abdominal bloating, grips are equal, face symmetrical, pt complains of dizziness at times

## 2016-12-13 NOTE — H&P (Signed)
[facility] [place] NAME: Darrell Clark    MR#:  [id number]  DATE OF BIRTH:  [birth year]  DATE OF ADMISSION:  12/13/2016  PRIMARY CARE PHYSICIAN: Marden Noble, MD   REQUESTING/REFERRING PHYSICIAN: Archie Balboa  CHIEF COMPLAINT:   Chief Complaint  Patient presents with  . Weakness    HISTORY OF PRESENT ILLNESS: Darrell Clark  is a 83 y.o. male with a known history of Coronary artery disease, diabetes, gastroesophageal reflux Disease, hyperlipidemia, hypertension, stroke, prostate cancer- lives at home and walks with a walker and sometimes cane. Since last 2 days he is having significant dizziness which is positional and bothering him every time he tried to get out of the bed but even sometimes while he is in the bed he feels like he is rotating around and after he positions himself inserting position his dizziness goes away. He had a fall the night before the last, he went to his primary care doctor's office and he could not find any significant reason and suggested to make some changes in the blood pressure medicines if he does not improve. Because of continued complaint patient decided to come to emergency room today. His infection work up is noted to be negative and MRI of the brain is done by ER physician which is negative for stroke. Patient continued to be significantly short of breath and falling down while trying to stand up in ER so he suggested to admit the patient to medical services.  PAST MEDICAL HISTORY:   Past Medical History:  Diagnosis Date  . Allergy   . Anxiety   . Arthritis   . CAD (coronary artery disease)   . Diabetes mellitus without complication ([place])   . GERD (gastroesophageal reflux disease)   . HLD (hyperlipidemia)   . Hypertension   . Prostate cancer ([place])   . Stroke [facility])     PAST SURGICAL HISTORY: Past Surgical History:  Procedure Laterality Date  . APPENDECTOMY      SOCIAL HISTORY:  Social History   Substance Use Topics  . Smoking status: Former Research scientist (life sciences)  . Smokeless tobacco: Never Used     Comment: quit 10 years ago   . Alcohol use No    FAMILY HISTORY:  Family History  Problem Relation Age of Onset  . Heart attack Mother   . Hypertension Mother   . Heart attack Father   . Hypertension Father   . Hypertension Sister   . Cancer Brother   . Hypertension Brother     DRUG ALLERGIES:  Allergies  Allergen Reactions  . Niacin Itching and Rash    Other reaction(s): Unknown Other reaction(s): UNKNOWN  . Niacin And Related Rash    REVIEW OF SYSTEMS:   CONSTITUTIONAL: No fever, fatigue or weakness. Has significant dizziness. EYES: No blurred or double vision.  EARS, NOSE, AND THROAT: No tinnitus or ear pain.  RESPIRATORY: No cough, shortness of breath, wheezing or hemoptysis.  CARDIOVASCULAR: No chest pain, orthopnea, edema.  GASTROINTESTINAL: No nausea, vomiting, diarrhea or abdominal pain.  GENITOURINARY: No dysuria, hematuria.  ENDOCRINE: No polyuria, nocturia,  HEMATOLOGY: No anemia, easy bruising or bleeding SKIN: No rash or lesion. MUSCULOSKELETAL: No joint pain or arthritis.   NEUROLOGIC: No tingling, numbness, weakness.  PSYCHIATRY: No anxiety or depression.   MEDICATIONS AT HOME:  Prior to Admission medications   Medication Sig Start Date End Date Taking? Authorizing Provider  acetaminophen (TYLENOL) 325 MG tablet Take 325-650 mg by mouth every 4 (  four) hours as needed.    Yes [provider]  amLODipine (NORVASC) 5 MG tablet Take 5 mg by mouth daily.  02/21/13  Yes [provider]  aspirin EC 81 MG EC tablet Take 1 tablet (81 mg total) by mouth daily. 04/16/16  Yes Theodoro Grist, MD  benazepril (LOTENSIN) 20 MG tablet Take 20 mg by mouth daily.    Yes [provider]  docusate sodium (COLACE) 100 MG capsule Take 100 mg by mouth 2 (two) times daily. As needed.   Yes [provider]  donepezil (ARICEPT) 10 MG tablet Take 10 mg  by mouth at bedtime.  02/21/13  Yes [provider]  Elastic Bandages & Supports (ACE BACK STABILIZER) MISC Use 1 Units once daily as needed. 10/03/15  Yes [provider]  fluticasone (FLONASE) 50 MCG/ACT nasal spray Place 2 sprays into both nostrils daily.  04/16/13  Yes [provider]  glipiZIDE (GLUCOTROL XL) 2.5 MG 24 hr tablet Take 1 tablet (2.5 mg total) by mouth daily with breakfast. 04/16/16  Yes Theodoro Grist, MD  lisinopril (PRINIVIL,ZESTRIL) 20 MG tablet Take 20 mg by mouth daily. Pt takes in evening   Yes [provider]  LORazepam (ATIVAN) 0.5 MG tablet Take 0.5 mg by mouth every 8 (eight) hours.   Yes [provider]  Misc Natural Products (OSTEO BI-FLEX/5-LOXIN ADVANCED PO) Take by mouth. Pt takes 2 tablets daily   Yes [provider]  nitroGLYCERIN (NITROSTAT) 0.4 MG SL tablet Place under the tongue. 11/20/13  Yes [provider]  ONE TOUCH ULTRA TEST test strip  04/16/13  Yes [provider]  ONETOUCH DELICA LANCETS 48N MISC  04/16/13  Yes [provider]  pantoprazole (PROTONIX) 40 MG tablet Take 40 mg by mouth daily.  08/06/13  Yes [provider]  simethicone (MYLICON) 80 MG chewable tablet Chew 80 mg by mouth every 6 (six) hours as needed. As needed.   Yes [provider]  simvastatin (ZOCOR) 20 MG tablet Take 20 mg by mouth daily at 6 PM.  03/09/13  Yes [provider]  clopidogrel (PLAVIX) 75 MG tablet Take 1 tablet (75 mg total) by mouth daily. Patient not taking: Reported on 12/13/2016 04/16/16   Theodoro Grist, MD  NEOMYCIN-POLYMYXIN-HYDROCORTISONE (CORTISPORIN) 1 % SOLN otic solution Apply 1-2 drops to toe BID after soaking Patient not taking: Reported on 06/22/2016 03/26/16   Hyatt, Max T, DPM      PHYSICAL EXAMINATION:   VITAL SIGNS: Blood pressure (!) 144/86, pulse 62, temperature 98.2 F (36.8 C), resp. rate 16, height 6' (1.829 m), weight 111.1 kg (245 lb), SpO2  95 %.  GENERAL:  83 y.o.-year-old patient lying in the bed with no acute distress.  EYES: Pupils equal, round, reactive to light and accommodation. No scleral icterus. Extraocular muscles intact.  HEENT: Head atraumatic, normocephalic. Oropharynx and nasopharynx clear.  NECK:  Supple, no jugular venous distention. No thyroid enlargement, no tenderness.  LUNGS: Normal breath sounds bilaterally, no wheezing, rales,rhonchi or crepitation. No use of accessory muscles of respiration.  CARDIOVASCULAR: S1, S2 normal. No murmurs, rubs, or gallops.  ABDOMEN: Soft, nontender, nondistended. Bowel sounds present. No organomegaly or mass.  EXTREMITIES: No pedal edema, cyanosis, or clubbing.  NEUROLOGIC: Cranial nerves II through XII are intact. Muscle strength 5/5 in all extremities. Sensation intact. Gait not checked. Eye movements and finger nose test are normal. PSYCHIATRIC: The patient is alert and oriented x 3.  SKIN: No obvious rash, lesion, or ulcer.  LABORATORY PANEL:   CBC  Recent Labs Lab 12/13/16 0628  WBC 6.5  HGB 17.0  HCT 49.2  PLT 173  MCV 94.9  MCH 32.7  MCHC 34.5  RDW 14.3   ------------------------------------------------------------------------------------------------------------------  Chemistries   Recent Labs Lab 12/13/16 0628  NA 142  K 3.4*  CL 106  CO2 30  GLUCOSE 152*  BUN 12  CREATININE 0.99  CALCIUM 9.0   ------------------------------------------------------------------------------------------------------------------ estimated creatinine clearance is 74 mL/min (by C-G formula based on SCr of 0.99 mg/dL). ------------------------------------------------------------------------------------------------------------------ No results for input(s): TSH, T4TOTAL, T3FREE, THYROIDAB in the last 72 hours.  Invalid input(s): FREET3   Coagulation profile No results for input(s): INR, PROTIME in the last 168  hours. ------------------------------------------------------------------------------------------------------------------- No results for input(s): DDIMER in the last 72 hours. -------------------------------------------------------------------------------------------------------------------  Cardiac Enzymes  Recent Labs Lab 12/13/16 0628  TROPONINI <0.03   ------------------------------------------------------------------------------------------------------------------ Invalid input(s): POCBNP  ---------------------------------------------------------------------------------------------------------------  Urinalysis    Component Value Date/Time   COLORURINE YELLOW (A) 12/13/2016 0747   APPEARANCEUR CLEAR (A) 12/13/2016 0747   APPEARANCEUR Clear 01/19/2013 1937   LABSPEC 1.013 12/13/2016 0747   LABSPEC 1.017 01/19/2013 1937   PHURINE 5.0 12/13/2016 0747   GLUCOSEU NEGATIVE 12/13/2016 0747   GLUCOSEU Negative 01/19/2013 1937   HGBUR NEGATIVE 12/13/2016 [place] NEGATIVE 12/13/2016 0747   BILIRUBINUR Negative 01/19/2013 1937   KETONESUR NEGATIVE 12/13/2016 0747   PROTEINUR NEGATIVE 12/13/2016 0747   NITRITE NEGATIVE 12/13/2016 0747   LEUKOCYTESUR NEGATIVE 12/13/2016 0747   LEUKOCYTESUR Negative 01/19/2013 1937     RADIOLOGY: Dg Chest 1 View  Result Date: 12/13/2016 CLINICAL DATA:  Underlies weakness for past week with a fall yesterday. The patient reports abdominal bloating. History of coronary artery disease, diabetes, CVA, discontinued smoking 10 years ago. EXAM: CHEST 1 VIEW COMPARISON:  Chest x-ray of July 27, 2016 FINDINGS: The lungs are adequately inflated and clear. The heart is top-normal in size. The pulmonary vascularity is normal. The mediastinum is normal in width. The trachea is midline. The bony thorax exhibits no acute abnormality. IMPRESSION: There is no active cardiopulmonary disease. Electronically Signed   By: David  Martinique M.D.   On: 12/13/2016  07:05   Ct Head Wo Contrast  Result Date: 12/13/2016 CLINICAL DATA:  Generalized weakness over the last week. Fell yesterday. EXAM: CT HEAD WITHOUT CONTRAST TECHNIQUE: Contiguous axial images were obtained from the base of the skull through the vertex without intravenous contrast. COMPARISON:  06/16/2016 FINDINGS: Brain: No change. Mild age related atrophy, less than often seen at this age. Mild small vessel change of the white matter, less than often seen at this age. Punctate parenchymal calcification of the left temporal lobe, unchanged. This could be a small benign parenchymal or vascular calcification, not likely significant. No sign of acute infarction, mass lesion, hemorrhage, hydrocephalus or extra-axial collection. Vascular: There is atherosclerotic calcification of the major vessels at the base of the brain. Skull: Normal Sinuses/Orbits: Clear/normal Other: None significant IMPRESSION: No acute or traumatic finding. Mild age related atrophy and small-vessel change of the white matter, less than often seen in individuals of this age. Electronically Signed   By: Nelson Chimes M.D.   On: 12/13/2016 07:30   Mr Brain Wo Contrast  Result Date: 12/13/2016 CLINICAL DATA:  Dizziness.  Fall yesterday. EXAM: MRI HEAD WITHOUT CONTRAST TECHNIQUE: Multiplanar, multiecho pulse sequences of the brain and surrounding structures were obtained without intravenous contrast. COMPARISON:  CT head 12/13/2016 FINDINGS: Brain: Ventricle size normal. Cerebral volume normal for age. Negative  for acute infarct. Patchy hyperintensity in the cerebral white matter bilaterally. Small chronic infarcts in the thalamus bilaterally. Chronic lacunar infarction right internal capsule. Negative for hemorrhage mass or edema. No shift of the midline structures. Vascular: Normal arterial flow voids Skull and upper cervical spine: Negative Sinuses/Orbits: Negative Other: None IMPRESSION: No acute intracranial abnormality. Chronic  microvascular ischemic changes in the white matter and basal ganglia. Electronically Signed   By: Franchot Gallo M.D.   On: 12/13/2016 10:25   Dg Abd 2 Views  Result Date: 12/13/2016 CLINICAL DATA:  Abdominal distention and bloating for 6 months, some nausea recently, history of prostate carcinoma EXAM: ABDOMEN - 2 VIEW COMPARISON:  CT abdomen and pelvis of 06/02/2013 FINDINGS: Supine and sitting views of the abdomen were obtained. No bowel obstruction is seen. No free air is noted. No opaque calculi are seen. There are degenerative changes in the mid to lower lumbar spine. IMPRESSION: 1. No bowel obstruction.  No free air. 2. No opaque calculi are seen. Electronically Signed   By: Ivar Drape M.D.   On: 12/13/2016 08:35    EKG: Orders placed or performed during the hospital encounter of 12/13/16  . ED EKG  . ED EKG  . EKG 12-Lead  . EKG 12-Lead  . EKG 12-Lead  . EKG 12-Lead    IMPRESSION AND PLAN:  * Dizziness   MRI of the brain is negative, and no signs of infection .   We'll monitor on telemetry for any arrhythmias.   Likely it is from vestibular origin   Start on Valium and meclizine oral.   Neurology and physical therapy consult.  * Hypertension   Continue home meds.  * Diabetes   Continue home meds and keep on sliding scale coverage here.  * History of stroke   Continue aspirin.  * History of coronary artery disease   Continue aspirin, simvastatin and benazepril.  All the records are reviewed and case discussed with ED provider. Management plans discussed with the patient, family and they are in agreement.  CODE STATUS: Full code. Code Status History    Date Active Date Inactive Code Status Order ID Comments User Context   04/15/2016  4:10 AM 04/16/2016  5:29 PM Full Code [id number]  Lance Coon, MD Inpatient       TOTAL TIME TAKING CARE OF THIS PATIENT: 50 minutes.    Vaughan Basta M.D on 12/13/2016   Between 7am to 6pm - Pager -  [phone]  After 6pm go to [email or web address] - password [email or web address] [facility]  Office  [phone]  CC: Primary care physician; Marden Noble, MD   Note: This dictation was prepared with Dragon dictation along with smaller phrase technology. Any transcriptional errors that result from this process are unintentional.

## 2016-12-14 DIAGNOSIS — R42 Dizziness and giddiness: Secondary | ICD-10-CM

## 2016-12-14 LAB — BASIC METABOLIC PANEL
ANION GAP: 5 (ref 5–15)
BUN: 13 mg/dL (ref 6–20)
CALCIUM: 8.7 mg/dL — AB (ref 8.9–10.3)
CHLORIDE: 106 mmol/L (ref 101–111)
CO2: 28 mmol/L (ref 22–32)
Creatinine, Ser: 0.86 mg/dL (ref 0.61–1.24)
GFR calc non Af Amer: >60 mL/min (ref >60)
Glucose, Bld: 131 mg/dL — ABNORMAL HIGH (ref 65–99)
Potassium: 3.4 mmol/L — ABNORMAL LOW (ref 3.5–5.1)
SODIUM: 139 mmol/L (ref 135–145)

## 2016-12-14 LAB — CBC
HCT: 46.9 % (ref 40.0–52.0)
HEMOGLOBIN: 16.1 g/dL (ref 13.0–18.0)
MCH: 31.9 pg (ref 26.0–34.0)
MCHC: 34.3 g/dL (ref 32.0–36.0)
MCV: 93.1 fL (ref 80.0–100.0)
Platelets: 179 10*3/uL (ref 150–440)
RBC: 5.04 MIL/uL (ref 4.40–5.90)
RDW: 14.2 % (ref 11.5–14.5)
WBC: 6.3 10*3/uL (ref 3.8–10.6)

## 2016-12-14 LAB — GLUCOSE, CAPILLARY
GLUCOSE-CAPILLARY: 141 mg/dL — AB (ref 65–99)
GLUCOSE-CAPILLARY: 156 mg/dL — AB (ref 65–99)
GLUCOSE-CAPILLARY: 131 mg/dL — AB (ref 65–99)
Glucose-Capillary: 113 mg/dL — ABNORMAL HIGH (ref 65–99)

## 2016-12-14 LAB — TSH: TSH: 1.879 u[IU]/mL (ref 0.350–4.500)

## 2016-12-14 MED ORDER — AMLODIPINE BESYLATE 10 MG PO TABS
10.0000 mg | ORAL_TABLET | Freq: Every day | ORAL | Status: DC
  Administered 2016-12-15 – 2016-12-17 (×3): 10 mg via ORAL
  Filled 2016-12-14 (×3): qty 1

## 2016-12-14 MED ORDER — POTASSIUM CHLORIDE CRYS ER 20 MEQ PO TBCR
40.0000 meq | EXTENDED_RELEASE_TABLET | Freq: Once | ORAL | Status: AC
Start: 2016-12-14 — End: 2016-12-14
  Administered 2016-12-14: 13:00:00 40 meq via ORAL
  Filled 2016-12-14: qty 2

## 2016-12-14 MED ORDER — HYPROMELLOSE (GONIOSCOPIC) 2.5 % OP SOLN
1.0000 [drp] | Freq: Four times a day (QID) | OPHTHALMIC | Status: DC
  Administered 2016-12-14 – 2016-12-17 (×10): 1 [drp] via OPHTHALMIC
  Filled 2016-12-14: qty 15

## 2016-12-14 NOTE — Progress Notes (Addendum)
[facility] at [place] NAME: Darrell Clark    MR#:  [id number]  DATE OF BIRTH:  09-12-1932  SUBJECTIVE:  CHIEF COMPLAINT:   Chief Complaint  Patient presents with  . Weakness   The patient is a 83 year old African-American male with medical history significant for history of coronary artery disease, diabetes mellitus, hyperlipidemia, hypertension, who presents to the hospital with Dizziness.   Review of Systems  Constitutional: Negative for chills, fever and weight loss.  HENT: Negative for congestion.   Eyes: Negative for blurred vision and double vision.  Respiratory: Negative for cough, sputum production, shortness of breath and wheezing.   Cardiovascular: Negative for chest pain, palpitations, orthopnea, leg swelling and PND.  Gastrointestinal: Negative for abdominal pain, blood in stool, constipation, diarrhea, nausea and vomiting.  Genitourinary: Negative for dysuria, frequency, hematuria and urgency.  Musculoskeletal: Negative for falls.  Neurological: Positive for dizziness. Negative for tremors, focal weakness and headaches.  Endo/Heme/Allergies: Does not bruise/bleed easily.  Psychiatric/Behavioral: Negative for depression. The patient does not have insomnia.     VITAL SIGNS: Blood pressure (!) 169/89, pulse 63, temperature 97.7 F (36.5 C), temperature source Oral, resp. rate 18, height 5\' 11"  (1.803 m), weight 100.4 kg (221 lb 4.8 oz), SpO2 95 %.  PHYSICAL EXAMINATION:   GENERAL:  83 y.o.-year-old patient lying in the bed with no acute distress.  EYES: Pupils equal, round, reactive to light and accommodation. No scleral icterus. Extraocular muscles intact.  HEENT: Head atraumatic, normocephalic. Oropharynx and nasopharynx clear.  NECK:  Supple, no jugular venous distention. No thyroid enlargement, no tenderness.  LUNGS: Normal breath sounds bilaterally, no wheezing, rales,rhonchi or crepitation. No use of accessory  muscles of respiration.  CARDIOVASCULAR: S1, S2 normal. No murmurs, rubs, or gallops.  ABDOMEN: Soft, nontender, nondistended. Bowel sounds present. No organomegaly or mass.  EXTREMITIES: No pedal edema, cyanosis, or clubbing.  NEUROLOGIC: Cranial nerves II through XII are intact. Muscle strength 5/5 in all extremities. Sensation intact. Gait not checked. Patient has decreased vision in left eye, medial visual field, peripheral vision is intact, intact vision in the right eye PSYCHIATRIC: The patient is alert and oriented x 3.  SKIN: No obvious rash, lesion, or ulcer.   ORDERS/RESULTS REVIEWED:   CBC  Recent Labs Lab 12/13/16 0628 12/14/16 0424  WBC 6.5 6.3  HGB 17.0 16.1  HCT 49.2 46.9  PLT 173 179  MCV 94.9 93.1  MCH 32.7 31.9  MCHC 34.5 34.3  RDW 14.3 14.2   ------------------------------------------------------------------------------------------------------------------  Chemistries   Recent Labs Lab 12/13/16 0628 12/14/16 0424  NA 142 139  K 3.4* 3.4*  CL 106 106  CO2 30 28  GLUCOSE 152* 131*  BUN 12 13  CREATININE 0.99 0.86  CALCIUM 9.0 8.7*   ------------------------------------------------------------------------------------------------------------------  Cardiac Enzymes  Recent Labs Lab 12/13/16 0628  TROPONINI <0.03   ------------------------------------------------------------------------------------------------------------------ Invalid input(s): POCBNP ---------------------------------------------------------------------------------------------------------------  RADIOLOGY: Dg Chest 1 View  Result Date: 12/13/2016 CLINICAL DATA:  Underlies weakness for past week with a fall yesterday. The patient reports abdominal bloating. History of coronary artery disease, diabetes, CVA, discontinued smoking 10 years ago. EXAM: CHEST 1 VIEW COMPARISON:  Chest x-ray of July 27, 2016 FINDINGS: The lungs are adequately inflated and clear. The heart is  top-normal in size. The pulmonary vascularity is normal. The mediastinum is normal in width. The trachea is midline. The bony thorax exhibits no acute abnormality. IMPRESSION: There is no active cardiopulmonary disease. Electronically Signed   By: Shanon Brow  Martinique M.D.   On: 12/13/2016 07:05   Ct Head Wo Contrast  Result Date: 12/13/2016 CLINICAL DATA:  Generalized weakness over the last week. Fell yesterday. EXAM: CT HEAD WITHOUT CONTRAST TECHNIQUE: Contiguous axial images were obtained from the base of the skull through the vertex without intravenous contrast. COMPARISON:  06/16/2016 FINDINGS: Brain: No change. Mild age related atrophy, less than often seen at this age. Mild small vessel change of the white matter, less than often seen at this age. Punctate parenchymal calcification of the left temporal lobe, unchanged. This could be a small benign parenchymal or vascular calcification, not likely significant. No sign of acute infarction, mass lesion, hemorrhage, hydrocephalus or extra-axial collection. Vascular: There is atherosclerotic calcification of the major vessels at the base of the brain. Skull: Normal Sinuses/Orbits: Clear/normal Other: None significant IMPRESSION: No acute or traumatic finding. Mild age related atrophy and small-vessel change of the white matter, less than often seen in individuals of this age. Electronically Signed   By: Nelson Chimes M.D.   On: 12/13/2016 07:30   Mr Brain Wo Contrast  Result Date: 12/13/2016 CLINICAL DATA:  Dizziness.  Fall yesterday. EXAM: MRI HEAD WITHOUT CONTRAST TECHNIQUE: Multiplanar, multiecho pulse sequences of the brain and surrounding structures were obtained without intravenous contrast. COMPARISON:  CT head 12/13/2016 FINDINGS: Brain: Ventricle size normal. Cerebral volume normal for age. Negative for acute infarct. Patchy hyperintensity in the cerebral white matter bilaterally. Small chronic infarcts in the thalamus bilaterally. Chronic lacunar  infarction right internal capsule. Negative for hemorrhage mass or edema. No shift of the midline structures. Vascular: Normal arterial flow voids Skull and upper cervical spine: Negative Sinuses/Orbits: Negative Other: None IMPRESSION: No acute intracranial abnormality. Chronic microvascular ischemic changes in the white matter and basal ganglia. Electronically Signed   By: Franchot Gallo M.D.   On: 12/13/2016 10:25   Dg Abd 2 Views  Result Date: 12/13/2016 CLINICAL DATA:  Abdominal distention and bloating for 6 months, some nausea recently, history of prostate carcinoma EXAM: ABDOMEN - 2 VIEW COMPARISON:  CT abdomen and pelvis of 06/02/2013 FINDINGS: Supine and sitting views of the abdomen were obtained. No bowel obstruction is seen. No free air is noted. No opaque calculi are seen. There are degenerative changes in the mid to lower lumbar spine. IMPRESSION: 1. No bowel obstruction.  No free air. 2. No opaque calculi are seen. Electronically Signed   By: Ivar Drape M.D.   On: 12/13/2016 08:35    EKG:  Orders placed or performed during the hospital encounter of 12/13/16  . ED EKG  . ED EKG  . EKG 12-Lead  . EKG 12-Lead  . EKG 12-Lead  . EKG 12-Lead    ASSESSMENT AND PLAN:  Principal Problem:   Dizziness  * Dizziness   MRI of the brain is negative, and no signs of infection .   - monitor on telemetry for any arrhythmias.   Likely it is from vestibular origin - trial of Valium and meclizine oral.   Neurology and physical therapy consult pending  * Hypertension   Continue lotensin and Increase the dose of Norvasc .  His blood pressure is high - Monitor blood pressure and adjust as need  * Hypokalemia - Replete and recheck  * Diabetes   Continue glipizide and keep on sliding scale coverage here.  * History of stroke   Continue aspirin & statin  * History of coronary artery disease   Continue aspirin, simvastatin and benazepril.    Management plans  discussed with the  patient, nursing and they are in agreement.   DRUG ALLERGIES:  Allergies  Allergen Reactions  . Niacin Itching and Rash    Other reaction(s): Unknown Other reaction(s): UNKNOWN  . Niacin And Related Rash    CODE STATUS: FULL CODE  TOTAL TIME TAKING CARE OF THIS PATIENT: 20 minutes.  Possible discharge in 1-2 days depending on clinical condition  Max Sane M.D on 12/14/2016 at 10:09 AM  Between 7am to 6pm - Pager - [phone]  After 6pm go to [email or web address] - password [email or web address] [facility]  Office  [phone]  CC: Primary care physician; Marden Noble, MD

## 2016-12-14 NOTE — Care Management (Signed)
Admitted to [facility] under observation status  With the diagnosis of dizziness. Lives alone. Daughter is Alleen Borne [phone]). Last seen Dr. Brynda Greathouse on Tuesday. Prescriptions are filled at [facility]. Home health 6 months ago. Doesn't remember name of agency. "They couldn't help me." [facility] 6 months ago. No home oxygen. Life alert, cane, and grab bars in the home. Takes care of all basic activities of daily living himself, only drives during the day., Fell last Tuesday. [employer]etite O [employer]. Daughter or son will transport. Physical therapy evaluation pending Shelbie Ammons RN MSN CCM Care Management [phone]

## 2016-12-14 NOTE — Consult Note (Signed)
Reason for Consult:dizziness  Referring Physician: Dr. Manuella Ghazi  CC: dizziness   HPI: Darrell Clark is an 83 y.o. male with a known history of Coronary artery disease, diabetes, gastroesophageal reflux Disease, hyperlipidemia, hypertension, stroke, prostate cancer- lives at home and walks with a walker and sometimes cane. Since last 2 days he is having significant dizziness which is positional and bothering him every time he tried to get up. Dizziness is positional and absent when laying flat.  Much improved since admission.   Past Medical History:  Diagnosis Date  . Allergy   . Anxiety   . Arthritis   . CAD (coronary artery disease)   . Diabetes mellitus without complication ([place])   . GERD (gastroesophageal reflux disease)   . HLD (hyperlipidemia)   . Hypertension   . Prostate cancer ([place])   . Stroke [facility])     Past Surgical History:  Procedure Laterality Date  . APPENDECTOMY      Family History  Problem Relation Age of Onset  . Heart attack Mother   . Hypertension Mother   . Heart attack Father   . Hypertension Father   . Hypertension Sister   . Cancer Brother   . Hypertension Brother     Social History:  reports that he has quit smoking. He has never used smokeless tobacco. He reports that he does not drink alcohol or use drugs.  Allergies  Allergen Reactions  . Niacin Itching and Rash    Other reaction(s): Unknown Other reaction(s): UNKNOWN  . Niacin And Related Rash    Medications: I have reviewed the patient's current medications.  ROS: History obtained from the patient  General ROS: negative for - chills, fatigue, fever, night sweats, weight gain or weight loss Psychological ROS: negative for - behavioral disorder, hallucinations, memory difficulties, mood swings or suicidal ideation Ophthalmic ROS: negative for - blurry vision, double vision, eye pain or loss of vision ENT ROS: negative for - epistaxis, nasal discharge, oral lesions, sore throat, tinnitus  or vertigo Allergy and Immunology ROS: negative for - hives or itchy/watery eyes Hematological and Lymphatic ROS: negative for - bleeding problems, bruising or swollen lymph nodes Endocrine ROS: negative for - galactorrhea, hair pattern changes, polydipsia/polyuria or temperature intolerance Respiratory ROS: negative for - cough, hemoptysis, shortness of breath or wheezing Cardiovascular ROS: negative for - chest pain, dyspnea on exertion, edema or irregular heartbeat Gastrointestinal ROS: negative for - abdominal pain, diarrhea, hematemesis, nausea/vomiting or stool incontinence Genito-Urinary ROS: negative for - dysuria, hematuria, incontinence or urinary frequency/urgency Musculoskeletal ROS: negative for - joint swelling or muscular weakness Neurological ROS: as noted in HPI Dermatological ROS: negative for rash and skin lesion changes  Physical Examination: Blood pressure (!) 169/89, pulse 63, temperature 97.7 F (36.5 C), temperature source Oral, resp. rate 18, height 5\' 11"  (1.803 m), weight 100.4 kg (221 lb 4.8 oz), SpO2 95 %.    Neurological Examination   Mental Status: Alert, oriented, thought content appropriate.  Speech fluent without evidence of aphasia.  Able to follow 3 step commands without difficulty. Cranial Nerves: II: Discs flat bilaterally; Visual fields grossly normal, pupils equal, round, reactive to light and accommodation III,IV, VI: ptosis not present, extra-ocular motions intact bilaterally V,VII: smile symmetric, facial light touch sensation normal bilaterally VIII: hearing normal bilaterally IX,X: gag reflex present XI: bilateral shoulder shrug XII: midline tongue extension Motor: Right : Upper extremity   5/5    Left:     Upper extremity   5/5  Lower extremity  5/5     Lower extremity   5/5 Tone and bulk:normal tone throughout; no atrophy noted Sensory: Pinprick and light touch intact throughout, bilaterally Deep Tendon Reflexes: 2+ and symmetric  throughout Plantars: Right: downgoing   Left: downgoing Cerebellar: normal finger-to-nose, normal rapid alternating movements and normal heel-to-shin test Gait: not tested      Laboratory Studies:   Basic Metabolic Panel:  Recent Labs Lab 12/13/16 0628 12/14/16 0424  NA 142 139  K 3.4* 3.4*  CL 106 106  CO2 30 28  GLUCOSE 152* 131*  BUN 12 13  CREATININE 0.99 0.86  CALCIUM 9.0 8.7*    Liver Function Tests: No results for input(s): AST, ALT, ALKPHOS, BILITOT, PROT, ALBUMIN in the last 168 hours. No results for input(s): LIPASE, AMYLASE in the last 168 hours. No results for input(s): AMMONIA in the last 168 hours.  CBC:  Recent Labs Lab 12/13/16 0628 12/14/16 0424  WBC 6.5 6.3  HGB 17.0 16.1  HCT 49.2 46.9  MCV 94.9 93.1  PLT 173 179    Cardiac Enzymes:  Recent Labs Lab 12/13/16 0628  TROPONINI <0.03    BNP: Invalid input(s): POCBNP  CBG:  Recent Labs Lab 12/13/16 1722 12/13/16 2037 12/14/16 0800  GLUCAP 111* 115* 141*    Microbiology: No results found for this or any previous visit.  Coagulation Studies: No results for input(s): LABPROT, INR in the last 72 hours.  Urinalysis:  Recent Labs Lab 12/13/16 0747  COLORURINE YELLOW*  LABSPEC 1.013  PHURINE 5.0  GLUCOSEU NEGATIVE  HGBUR NEGATIVE  BILIRUBINUR NEGATIVE  KETONESUR NEGATIVE  PROTEINUR NEGATIVE  NITRITE NEGATIVE  LEUKOCYTESUR NEGATIVE    Lipid Panel:     Component Value Date/Time   CHOL 93 04/15/2016 0455   TRIG 74 04/15/2016 0455   HDL 31 (L) 04/15/2016 0455   CHOLHDL 3.0 04/15/2016 0455   VLDL 15 04/15/2016 0455   LDLCALC 47 04/15/2016 0455    HgbA1C:  Lab Results  Component Value Date   HGBA1C 5.9 (H) 04/15/2016    Urine Drug Screen:  No results found for: LABOPIA, COCAINSCRNUR, LABBENZ, AMPHETMU, THCU, LABBARB  Alcohol Level: No results for input(s): ETH in the last 168 hours.  Other results: EKG: normal EKG, normal sinus rhythm, unchanged from  previous tracings.  Imaging: Dg Chest 1 View  Result Date: 12/13/2016 CLINICAL DATA:  Underlies weakness for past week with a fall yesterday. The patient reports abdominal bloating. History of coronary artery disease, diabetes, CVA, discontinued smoking 10 years ago. EXAM: CHEST 1 VIEW COMPARISON:  Chest x-ray of July 27, 2016 FINDINGS: The lungs are adequately inflated and clear. The heart is top-normal in size. The pulmonary vascularity is normal. The mediastinum is normal in width. The trachea is midline. The bony thorax exhibits no acute abnormality. IMPRESSION: There is no active cardiopulmonary disease. Electronically Signed   By: David  Martinique M.D.   On: 12/13/2016 07:05   Ct Head Wo Contrast  Result Date: 12/13/2016 CLINICAL DATA:  Generalized weakness over the last week. Fell yesterday. EXAM: CT HEAD WITHOUT CONTRAST TECHNIQUE: Contiguous axial images were obtained from the base of the skull through the vertex without intravenous contrast. COMPARISON:  06/16/2016 FINDINGS: Brain: No change. Mild age related atrophy, less than often seen at this age. Mild small vessel change of the white matter, less than often seen at this age. Punctate parenchymal calcification of the left temporal lobe, unchanged. This could be a small benign parenchymal or vascular calcification, not likely significant. No  sign of acute infarction, mass lesion, hemorrhage, hydrocephalus or extra-axial collection. Vascular: There is atherosclerotic calcification of the major vessels at the base of the brain. Skull: Normal Sinuses/Orbits: Clear/normal Other: None significant IMPRESSION: No acute or traumatic finding. Mild age related atrophy and small-vessel change of the white matter, less than often seen in individuals of this age. Electronically Signed   By: Nelson Chimes M.D.   On: 12/13/2016 07:30   Mr Brain Wo Contrast  Result Date: 12/13/2016 CLINICAL DATA:  Dizziness.  Fall yesterday. EXAM: MRI HEAD WITHOUT CONTRAST  TECHNIQUE: Multiplanar, multiecho pulse sequences of the brain and surrounding structures were obtained without intravenous contrast. COMPARISON:  CT head 12/13/2016 FINDINGS: Brain: Ventricle size normal. Cerebral volume normal for age. Negative for acute infarct. Patchy hyperintensity in the cerebral white matter bilaterally. Small chronic infarcts in the thalamus bilaterally. Chronic lacunar infarction right internal capsule. Negative for hemorrhage mass or edema. No shift of the midline structures. Vascular: Normal arterial flow voids Skull and upper cervical spine: Negative Sinuses/Orbits: Negative Other: None IMPRESSION: No acute intracranial abnormality. Chronic microvascular ischemic changes in the white matter and basal ganglia. Electronically Signed   By: Franchot Gallo M.D.   On: 12/13/2016 10:25   Dg Abd 2 Views  Result Date: 12/13/2016 CLINICAL DATA:  Abdominal distention and bloating for 6 months, some nausea recently, history of prostate carcinoma EXAM: ABDOMEN - 2 VIEW COMPARISON:  CT abdomen and pelvis of 06/02/2013 FINDINGS: Supine and sitting views of the abdomen were obtained. No bowel obstruction is seen. No free air is noted. No opaque calculi are seen. There are degenerative changes in the mid to lower lumbar spine. IMPRESSION: 1. No bowel obstruction.  No free air. 2. No opaque calculi are seen. Electronically Signed   By: Ivar Drape M.D.   On: 12/13/2016 08:35     Assessment/Plan:  83 y.o. male with a known history of Coronary artery disease, diabetes, gastroesophageal reflux Disease, hyperlipidemia, hypertension, stroke, prostate cancer- lives at home and walks with a walker and sometimes cane. Since last 2 days he is having significant dizziness which is positional and bothering him every time he tried to get up. Dizziness is positional and absent when laying flat.  Much improved since admission. MRI brain no acute abnormalities.  - Symptoms are clearly positional  - No  further imaging - Con't meclizine/valium - probably d/c planning tomorrow - pt/ot and Epleys maneuver if does not resolve.  Leotis Pain  12/14/2016, 10:51 AM

## 2016-12-14 NOTE — Progress Notes (Signed)
Physical Therapy Evaluation Patient Details Name: Darrell Clark MRN: [id number] DOB: Mar 04, 1933 Today's Date: 12/14/2016   History of Present Illness  Darrell Clark is a 83 y.o. male with a known history of coronary artery disease, diabetes, gastroesophageal reflux disease, hyperlipidemia, hypertension, stroke, prostate cancer- lives at home and reports use of single point cane for ambulation as needed. Since Monday he is having significant dizziness which is positional and bothering him every time he tried to get out of the bed but even sometimes while he is in the bed he feels like he is rotating around. He had a fall the night before the last, he went to his primary care doctor's office and he could not find any significant reason and suggested to make some changes in the blood pressure medicines if he does not improve. Because of continued complaint patient decided to come to emergency room today. His infection work up is noted to be negative and MRI of the brain is done by ER physician which is negative for stroke. Patient continued to be significantly short of breath and falling down while trying to stand up in ER so he suggested to admit the patient to medical services. During PT evaluation pt reports feeling dizzy and "whoozy" and like he might pass out. Denies any syncopal episodes. Unable to quantify how long his dizziness lasts when questioned. Denies tinnitus or other auditory changes. Pt is unable to determine whether he has had any visual changes. He has a history of L visual field loss s/p CVA. Intermittent N/T in hands and feet for at least the last year or so but no new onset. Denies slurring of speech. Denies difficulty swallowing but reports he feels like he has food stuck in his throat. However this has been going on for longer than his symptoms. Aggravating factors for his dizziness include rolling, sitting up, standing up; Easing factors include rest. Pt is not a great historian and  it is difficult to get him to clearly describe how he is feeling.   Clinical Impression  Pt admitted with above diagnosis. Pt currently with functional limitations due to the deficits listed below (see PT Problem List).  Pt is unsteady with transfers and ambulation. He requires multiple attempts to stand as well as assist from therapist due to instability. Once upright he requires UE support on rolling walker to steady himself. Pt initially requires minA+1 to steady during ambulation but mostly relies on heavy support on rolling walker. Denies dizziness with ambulation. Gait speed is decreased and pt takes short steps. Slowing of gait and lateral deviation with horizontal and vertical head turns. Unable to truly reproduce patient's dizziness symptoms on this date. He does not appear to have any spontaneous or gaze evoked nystagmus. VOR is negative for blurring or dizziness. VOR thrust is possibly positive to the R and appears negative to the left but due to hooded eyelids it is difficult to observe. He also appears to possibly have a few beats of post-headshake L horizontal nystagmus but again this is difficult to observe. These findings may indicate a peripheral vestibular dysfunction. Marye Round and horizontal canal testing are negative for appropriate nystagmus or vertigo bilaterally. However in the R Dix-Hallpike position pt appears to have some downbeating nystagmus which then converts to R horizontal. Once again this is very difficult to observe. He reports some mild dizziness with VOR cancellation testing. VOR cancellation and direction changing nystagmus are central findings. MRI has been performed without acute findings. Epley interevention not  performed on this date as Dix-Hallpike testing was negative. Pt is too unsteady to discharge home in his current condition. He will need SNF placement at discharge to facilitate safe return home. He would benefit from OP evaluation by ENT to further assess his  vestibular function. He would benefit from OT evaluation during admission or at [facility]. He is AOx2 during PT evaluation and a difficult historian. PT has questions regarding his safety at home with caring for himself appropriately such as taking his medications correctly. Pt will benefit from PT services to address deficits in strength, balance, and mobility in order to return to full function at home.      Follow Up Recommendations SNF    Equipment Recommendations  Rolling walker with 5" wheels    Recommendations for Other Services OT consult     Precautions / Restrictions Precautions Precautions: Fall Restrictions Weight Bearing Restrictions: No      Mobility  Bed Mobility Overal bed mobility: Needs Assistance Bed Mobility: Supine to Sit;Sit to Supine     Supine to sit: Min assist Sit to supine: Min assist   General bed mobility comments: Pt requires HOB elevated and use of bed rails as well as physical assist for rolling and to sit up to EOB  Transfers Overall transfer level: Needs assistance Equipment used: Rolling walker (2 wheeled) Transfers: Sit to/from Stand Sit to Stand: Min assist         General transfer comment: Pt is unsteady with transfers and requires multiple attempts as well as assist to come to standing. Once upright he requires UE support on rolling walker to steady himself  Ambulation/Gait Ambulation/Gait assistance: Min assist Ambulation Distance (Feet): 80 Feet Assistive device: Rolling walker (2 wheeled) Gait Pattern/deviations: Decreased step length - right;Decreased step length - left Gait velocity: Decreased Gait velocity interpretation: <1.8 ft/sec, indicative of risk for recurrent falls General Gait Details: Pt is initially unsteady with ambulation. He requires infrequent mina+1 to steady but mostly heavy support on rolling walker. Denies dizziness with ambulation. Gait speed is decreased and pt takes short steps. Slowing of gait and lateral  deviation with horizontal and vertical head turns  Financial trader Rankin (Stroke Patients Only)       Balance Overall balance assessment: Needs assistance Sitting-balance support: No upper extremity supported Sitting balance-Leahy Scale: Good     Standing balance support: No upper extremity supported Standing balance-Leahy Scale: Fair Standing balance comment: Able to maintain feet apart/together balance without UE support. Positive Rhomberg and single leg balance is 1-2 seconds                             Pertinent Vitals/Pain Pain Assessment: No/denies pain    Home Living Family/patient expects to be discharged to:: Private residence Living Arrangements: Alone Available Help at Discharge: Family;Other (Comment) (Limited assist from family/friends) Type of Home: House Home Access: Ramped entrance     Home Layout: One level Home Equipment: Cane - single point;Grab bars - tub/shower;Walker - 4 wheels (no BSC, no wc, no lift chair, no hospital bed)      Prior Function Level of Independence: Needs assistance         Comments: Patient reports he uses a cane due to back pain.  He reports being independent with basic self care tasks. Pt reports that he mostly goes out to eat but sometimes  family/friends bring his meals. Pt has assist from family with cleaning the home.     Hand Dominance   Dominant Hand: Right    Extremity/Trunk Assessment   Upper Extremity Assessment Upper Extremity Assessment: Overall WFL for tasks assessed (No focal weakness, denies N/T with testing)    Lower Extremity Assessment Lower Extremity Assessment: Generalized weakness (Denies N/T to light touch)       Communication   Communication: No difficulties  Cognition Arousal/Alertness: Awake/alert Behavior During Therapy: WFL for tasks assessed/performed Overall Cognitive Status: No family/caregiver present to determine baseline  cognitive functioning                                 General Comments: Disoriented to month and year. Poor historian.      General Comments      Exercises     Assessment/Plan    PT Assessment Patient needs continued PT services  PT Problem List Decreased strength;Decreased balance;Decreased mobility;Decreased knowledge of use of DME;Decreased safety awareness       PT Treatment Interventions DME instruction;Gait training;Therapeutic activities;Functional mobility training;Therapeutic exercise;Balance training;Patient/family education    PT Goals (Current goals can be found in the Care Plan section)  Acute Rehab PT Goals Patient Stated Goal: Return to prior function at home PT Goal Formulation: With patient Time For Goal Achievement: 12/28/16 Potential to Achieve Goals: Fair    Frequency Min 2X/week   Barriers to discharge Decreased caregiver support Lives alone. Pt reports he drives himself to go out for food but with visual impairments it is unclear whether he is safe to drive    Co-evaluation               AM-PAC PT "6 Clicks" Daily Activity  Outcome Measure Difficulty turning over in bed (including adjusting bedclothes, sheets and blankets)?: Total Difficulty moving from lying on back to sitting on the side of the bed? : Total Difficulty sitting down on and standing up from a chair with arms (e.g., wheelchair, bedside commode, etc,.)?: Total Help needed moving to and from a bed to chair (including a wheelchair)?: A Little Help needed walking in hospital room?: A Little Help needed climbing 3-5 steps with a railing? : A Little 6 Click Score: 12    End of Session Equipment Utilized During Treatment: Gait belt Activity Tolerance: Patient tolerated treatment well Patient left: in bed;with call bell/phone within reach;with bed alarm set   PT Visit Diagnosis: Unsteadiness on feet (R26.81);Other abnormalities of gait and mobility (R26.89);Muscle  weakness (generalized) (M62.81);History of falling (Z91.81)    Time: 5638-7564 PT Time Calculation (min) (ACUTE ONLY): 47 min   Charges:   PT Evaluation $PT Eval High Complexity: 1 Procedure PT Treatments $Therapeutic Activity: 8-22 mins   PT G Codes:   PT G-Codes **NOT FOR INPATIENT CLASS** Functional Assessment Tool Used: AM-PAC 6 Clicks Basic Mobility Functional Limitation: Mobility: Walking and moving around Mobility: Walking and Moving Around Current Status (P3295): At least 60 percent but less than 80 percent impaired, limited or restricted Mobility: Walking and Moving Around Goal Status [phone]): At least 20 percent but less than 40 percent impaired, limited or restricted    Phillips Grout PT, DPT    Huprich,Jason 12/14/2016, 2:07 PM

## 2016-12-15 LAB — CBC
HCT: 47.9 % (ref 40.0–52.0)
Hemoglobin: 16.7 g/dL (ref 13.0–18.0)
MCH: 32.6 pg (ref 26.0–34.0)
MCHC: 34.9 g/dL (ref 32.0–36.0)
MCV: 93.6 fL (ref 80.0–100.0)
PLATELETS: 178 10*3/uL (ref 150–440)
RBC: 5.12 MIL/uL (ref 4.40–5.90)
RDW: 13.9 % (ref 11.5–14.5)
WBC: 6.5 10*3/uL (ref 3.8–10.6)

## 2016-12-15 LAB — BASIC METABOLIC PANEL
ANION GAP: 6 (ref 5–15)
BUN: 16 mg/dL (ref 6–20)
CALCIUM: 8.9 mg/dL (ref 8.9–10.3)
CO2: 28 mmol/L (ref 22–32)
Chloride: 104 mmol/L (ref 101–111)
Creatinine, Ser: 1 mg/dL (ref 0.61–1.24)
Glucose, Bld: 135 mg/dL — ABNORMAL HIGH (ref 65–99)
Potassium: 3.9 mmol/L (ref 3.5–5.1)
Sodium: 138 mmol/L (ref 135–145)

## 2016-12-15 LAB — GLUCOSE, CAPILLARY
GLUCOSE-CAPILLARY: 146 mg/dL — AB (ref 65–99)
GLUCOSE-CAPILLARY: 156 mg/dL — AB (ref 65–99)
Glucose-Capillary: 150 mg/dL — ABNORMAL HIGH (ref 65–99)

## 2016-12-15 MED ORDER — LORATADINE 10 MG PO TABS
10.0000 mg | ORAL_TABLET | Freq: Every day | ORAL | Status: DC
  Administered 2016-12-15 – 2016-12-17 (×3): 10 mg via ORAL
  Filled 2016-12-15 (×3): qty 1

## 2016-12-15 MED ORDER — MONTELUKAST SODIUM 10 MG PO TABS
10.0000 mg | ORAL_TABLET | Freq: Every day | ORAL | Status: DC
  Administered 2016-12-15 – 2016-12-16 (×2): 10 mg via ORAL
  Filled 2016-12-15 (×2): qty 1

## 2016-12-15 NOTE — Clinical Social Work Placement (Addendum)
   CLINICAL SOCIAL WORK PLACEMENT  NOTE  Date:  12/15/2016  Patient Details  Name: Darrell Clark MRN: [id number] Date of Birth: 01-28-1933  Clinical Social Work is seeking post-discharge placement for this patient at the [place] level of care (*CSW will initial, date and re-position this form in  chart as items are completed):  Yes   Patient/family provided with [facility] Work Department's list of facilities offering this level of care within the geographic area requested by the patient (or if unable, by the patient's family).  Yes   Patient/family informed of their freedom to choose among providers that offer the needed level of care, that participate in Medicare, Medicaid or managed care program needed by the patient, have an available bed and are willing to accept the patient.  Yes   Patient/family informed of [facility]'s ownership interest in [facility] and [facility], as well as of the fact that they are under no obligation to receive care at these facilities.  PASRR submitted to EDS on       PASRR number received on       Existing PASRR number confirmed on 12/15/16     FL2 transmitted to all facilities in geographic area requested by pt/family on 12/15/16     FL2 transmitted to all facilities within larger geographic area on       Patient informed that his/her managed care company has contracts with or will negotiate with certain facilities, including the following:        12-16-16   Patient/family informed of bed offers received. (Updated Evette Cristal, MSW, Latanya Presser, 12-17-16)  Patient chooses bed at  [facility] (Updated Evette Cristal, MSW, LCSWA, 12-17-16)     Physician recommends and patient chooses bed at      Patient to be transferred to  [facility] on   (Updated Evette Cristal, MSW, LCSWA, 12-17-16)  Patient to be transferred to facility by  Patient's daughter's car (Updated Evette Cristal, MSW, Latanya Presser, 12-17-16)      Patient family notified on  12-17-16 of transfer. (Updated Evette Cristal, MSW, Latanya Presser, 12-17-16)  Name of family member notified:   Patient notified his daughter (Updated Evette Cristal, MSW, LCSWA, 12-17-16)     PHYSICIAN       Additional Comment:    _______________________________________________ Zettie Pho, LCSW 12/15/2016, 4:06 PM   Jones Broom. Norval Morton, MSW, [place]  12/17/2016 2:13 PM (Updated Evette Cristal, MSW, LCSWA, 12-17-16)

## 2016-12-15 NOTE — Care Management Obs Status (Signed)
[facility] NOTIFICATION   Patient Details  Name: MORTON SIMSON MRN: [id number] Date of Birth: November 18, 1932   Medicare Observation Status Notification Given:  Yes [facility] letter)    Mardene Speak, RN 12/15/2016, 5:10 PM

## 2016-12-15 NOTE — Progress Notes (Signed)
Darrell Clark at [place] NAME: Darrell Clark    MR#:  [id number]  DATE OF BIRTH:  05-03-1933  SUBJECTIVE:  CHIEF COMPLAINT:   Chief Complaint  Patient presents with  . Weakness   Still c/o dizziness seen by PT recommends SNF   Review of Systems  Constitutional: Negative for chills, fever and weight loss.  HENT: Negative for congestion.   Eyes: Negative for blurred vision and double vision.  Respiratory: Negative for cough, sputum production, shortness of breath and wheezing.   Cardiovascular: Negative for chest pain, palpitations, orthopnea, leg swelling and PND.  Gastrointestinal: Negative for abdominal pain, blood in stool, constipation, diarrhea, nausea and vomiting.  Genitourinary: Negative for dysuria, frequency, hematuria and urgency.  Musculoskeletal: Negative for falls.  Neurological: Positive for dizziness. Negative for tremors, focal weakness and headaches.  Endo/Heme/Allergies: Does not bruise/bleed easily.  Psychiatric/Behavioral: Negative for depression. The patient does not have insomnia.     VITAL SIGNS: Blood pressure (!) 128/59, pulse 69, temperature 98 F (36.7 C), temperature source Oral, resp. rate 18, height 5\' 11"  (1.803 m), weight 221 lb 4.8 oz (100.4 kg), SpO2 95 %.  PHYSICAL EXAMINATION:   GENERAL:  83 y.o.-year-old patient lying in the bed with no acute distress.  EYES: Pupils equal, round, reactive to light and accommodation. No scleral icterus. Extraocular muscles intact.  HEENT: Head atraumatic, normocephalic. Oropharynx and nasopharynx clear.  NECK:  Supple, no jugular venous distention. No thyroid enlargement, no tenderness.  LUNGS: Normal breath sounds bilaterally, no wheezing, rales,rhonchi or crepitation. No use of accessory muscles of respiration.  CARDIOVASCULAR: S1, S2 normal. No murmurs, rubs, or gallops.  ABDOMEN: Soft, nontender, nondistended. Bowel sounds present. No organomegaly or mass.   EXTREMITIES: No pedal edema, cyanosis, or clubbing.  NEUROLOGIC: Cranial nerves II through XII are intact. Muscle strength 5/5 in all extremities. Sensation intact. Gait not checked. Patient has decreased vision in left eye, medial visual field, peripheral vision is intact, intact vision in the right eye PSYCHIATRIC: The patient is alert and oriented x 3.  SKIN: No obvious rash, lesion, or ulcer.   ORDERS/RESULTS REVIEWED:   CBC  Recent Labs Lab 12/13/16 0628 12/14/16 0424 12/15/16 0502  WBC 6.5 6.3 6.5  HGB 17.0 16.1 16.7  HCT 49.2 46.9 47.9  PLT 173 179 178  MCV 94.9 93.1 93.6  MCH 32.7 31.9 32.6  MCHC 34.5 34.3 34.9  RDW 14.3 14.2 13.9   ------------------------------------------------------------------------------------------------------------------  Chemistries   Recent Labs Lab 12/13/16 0628 12/14/16 0424 12/15/16 0502  NA 142 139 138  K 3.4* 3.4* 3.9  CL 106 106 104  CO2 30 28 28   GLUCOSE 152* 131* 135*  BUN 12 13 16   CREATININE 0.99 0.86 1.00  CALCIUM 9.0 8.7* 8.9   ------------------------------------------------------------------------------------------------------------------  Cardiac Enzymes  Recent Labs Lab 12/13/16 0628  TROPONINI <0.03   ------------------------------------------------------------------------------------------------------------------ Invalid input(s): POCBNP ---------------------------------------------------------------------------------------------------------------  RADIOLOGY: No results found.  EKG:  Orders placed or performed during the hospital encounter of 12/13/16  . ED EKG  . ED EKG  . EKG 12-Lead  . EKG 12-Lead  . EKG 12-Lead  . EKG 12-Lead    ASSESSMENT AND PLAN:  Principal Problem:   Dizziness  * Dizziness   MRI of the brain is negative, and no signs of infection .    Likely it is from vestibular origin   Continue  Valium and meclizine oral. Appreciate neuro input,   * Hypertension   Continue  lotensin and  Norvasc .  bp now stable   * Hypokalemia - Replaced  * Diabetes   Continue glipizide and keep on sliding scale coverage here.  * History of stroke   Continue aspirin & statin  * History of coronary artery disease   Continue aspirin, simvastatin and benazepril.    Rate discharged to skilled nursing facility   DRUG ALLERGIES:  Allergies  Allergen Reactions  . Niacin Itching and Rash    Other reaction(s): Unknown Other reaction(s): UNKNOWN  . Niacin And Related Rash    CODE STATUS: FULL CODE  TOTAL TIME TAKING CARE OF THIS PATIENT: 20 minutes.  Possible discharge in 1-2 days depending on clinical condition  Dustin Flock M.D on 12/15/2016 at 1:24 PM  Between 7am to 6pm - Pager - [phone]  After 6pm go to [email or web address] - password [email or web address] [facility]  Office  [phone]  CC: Primary care physician; Marden Noble, MD

## 2016-12-15 NOTE — NC FL2 (Signed)
[place] LEVEL OF CARE SCREENING TOOL     IDENTIFICATION  Patient Name: Darrell Clark Birthdate: 05/14/1933 Sex: male Admission Date (Current Location): 12/13/2016  [place] and [place] Number:  Engineering geologist and Address:  [facility], [address], [place], [place] [address]      Provider Number: [id number]  Attending Physician Name and Address:  Dustin Flock, MD  Relative Name and Phone Number:       Current Level of Care: Hospital Recommended Level of Care: [place] Prior Approval Number:    Date Approved/Denied:   PASRR Number: [id number]  Discharge Plan: SNF    Current Diagnoses: Patient Active Problem List   Diagnosis Date Noted  . Dizziness 12/13/2016  . Vision, loss, sudden 04/16/2016  . 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 Intracranial vascular stenosis 04/16/2016  . CVA (cerebral vascular accident) ([place]) 04/15/2016  . HTN (hypertension) 04/15/2016  . Diabetes ([place]) 04/15/2016  . GERD (gastroesophageal reflux disease) 04/15/2016  . HLD (hyperlipidemia) 04/15/2016    Orientation RESPIRATION BLADDER Height & Weight     Self, Time, Situation, Place  Normal Continent Weight: 221 lb 4.8 oz (100.4 kg) Height:  5\' 11"  (180.3 cm)  BEHAVIORAL SYMPTOMS/MOOD NEUROLOGICAL BOWEL NUTRITION STATUS      Continent Diet (Heart Healthy/Carb modified)  AMBULATORY STATUS COMMUNICATION OF NEEDS Skin   Extensive Assist Verbally Normal                       Personal Care Assistance Level of Assistance  Bathing, Feeding, Dressing Bathing Assistance: Limited assistance Feeding assistance: Independent Dressing Assistance: Limited assistance     Functional Limitations Info             SPECIAL CARE FACTORS FREQUENCY  PT (By licensed PT)     PT Frequency: Up to 5X per day, 5 days per week              Contractures Contractures Info:  Present    Additional Factors Info  Allergies   Allergies Info:  Niacin, Niacin And Related           Current Medications (12/15/2016):  This is the current hospital active medication list Current Facility-Administered Medications  Medication Dose Route Frequency Provider Last Rate Last Dose  . acetaminophen (TYLENOL) tablet 650 mg  650 mg Oral Q6H PRN Vaughan Basta, MD   650 mg at 12/14/16 2106  . amLODipine (NORVASC) tablet 10 mg  10 mg Oral Daily Max Sane, MD   10 mg at 12/15/16 1026  . aspirin EC tablet 81 mg  81 mg Oral Daily Vaughan Basta, MD   81 mg at 12/15/16 1026  . benazepril (LOTENSIN) tablet 20 mg  20 mg Oral Daily Vaughan Basta, MD   20 mg at 12/15/16 1026  . clopidogrel (PLAVIX) tablet 75 mg  75 mg Oral Daily Lance Coon, MD   75 mg at 12/15/16 1026  . diazepam (VALIUM) tablet 5 mg  5 mg Oral BID Vaughan Basta, MD   5 mg at 12/15/16 1026  . docusate sodium (COLACE) capsule 100 mg  100 mg Oral BID PRN Vaughan Basta, MD      . docusate sodium (COLACE) capsule 100 mg  100 mg Oral BID PRN Vaughan Basta, MD      . donepezil (ARICEPT) tablet 10 mg  10 mg Oral QHS Vaughan Basta, MD   10 mg at  12/14/16 2108  . fluticasone (FLONASE) 50 MCG/ACT nasal spray 2 spray  2 spray Each Nare Daily Vaughan Basta, MD   2 spray at 12/15/16 1028  . glipiZIDE (GLUCOTROL XL) 24 hr tablet 2.5 mg  2.5 mg Oral Q breakfast Vaughan Basta, MD   2.5 mg at 12/15/16 1026  . heparin injection 5,000 Units  5,000 Units Subcutaneous Q8H Vaughan Basta, MD   5,000 Units at 12/15/16 0321  . hydroxypropyl methylcellulose / hypromellose (ISOPTO TEARS / GONIOVISC) 2.5 % ophthalmic solution 1 drop  1 drop Both Eyes QID Max Sane, MD   1 drop at 12/15/16 1032  . insulin aspart (novoLOG) injection 0-9 Units  0-9 Units Subcutaneous TID WC Vaughan Basta, MD   1 Units at 12/15/16 1131  . latanoprost (XALATAN) 0.005 %  ophthalmic solution 1 drop  1 drop Right Eye Corwin Levins, MD   1 drop at 12/14/16 2107  . LORazepam (ATIVAN) tablet 0.5 mg  0.5 mg Oral Q6H PRN Vaughan Basta, MD   0.5 mg at 12/13/16 2211  . meclizine (ANTIVERT) tablet 12.5 mg  12.5 mg Oral TID PRN Vaughan Basta, MD   12.5 mg at 12/15/16 1108  . montelukast (SINGULAIR) tablet 10 mg  10 mg Oral QHS Dustin Flock, MD      . pantoprazole (PROTONIX) EC tablet 40 mg  40 mg Oral Daily Vaughan Basta, MD   40 mg at 12/15/16 1027  . simvastatin (ZOCOR) tablet 20 mg  20 mg Oral q1800 Vaughan Basta, MD   20 mg at 12/14/16 1732     Discharge Medications: Please see discharge summary for a list of discharge medications.  Relevant Imaging Results:  Relevant Lab Results:   Additional Information SS# [id number]  Zettie Pho, LCSW

## 2016-12-15 NOTE — Clinical Social Work Note (Signed)
Clinical Social Work Assessment  Patient Details  Name: Darrell Clark MRN: [id number] Date of Birth: [birth year]  Date of referral:  12/15/16               Reason for consult:  Facility Placement                Permission sought to share information with:  Facility Art therapist granted to share information::  Yes, Verbal Permission Granted  Name::        Agency::     Relationship::     Contact Information:     Housing/Transportation Living arrangements for the past 2 months:  [place] of Information:  Patient, Adult Children Patient Interpreter Needed:  None Criminal Activity/Legal Involvement Pertinent to Current Situation/Hospitalization:  No - Comment as needed Significant Relationships:  Adult Children Lives with:  Self Do you feel safe going back to the place where you live?  Yes Need for family participation in patient care:  No (Coment)  Care giving concerns:  PT recommendation for STR   Social Worker assessment / plan: CSW met with patient and his son and daughter at bedside to discuss discharge planning. The patient gave verbal permission to conduct bed search and indicated [facility] as his preference. The patient refused referral to [facility]. The CSW has prepared the referral. The patient may discharge as soon as tomorrow pending bed offers.   Employment status:  Retired Nurse, adult PT Recommendations:  [place] / Referral to community resources:  [place]  Patient/Family's Response to care: The patient and his family thanked the CSW for assistance.  Patient/Family's Understanding of and Emotional Response to Diagnosis, Current Treatment, and Prognosis:  The patient and his family understand and agree with the PT recommendation.  Emotional Assessment Appearance:  Appears stated age Attitude/Demeanor/Rapport:   (Pleasant and cooperative) Affect  (typically observed):  Appropriate, Pleasant Orientation:  Oriented to Self, Oriented to Place, Oriented to  Time, Oriented to Situation Alcohol / Substance use:  Never Used Psych involvement (Current and /or in the community):  No (Comment)  Discharge Needs  Concerns to be addressed:  Care Coordination, Discharge Planning Concerns Readmission within the last 30 days:  No Current discharge risk:  None Barriers to Discharge:  Continued Medical Work up   [employer], LCSW 12/15/2016, 4:04 PM

## 2016-12-16 LAB — GLUCOSE, CAPILLARY
GLUCOSE-CAPILLARY: 141 mg/dL — AB (ref 65–99)
GLUCOSE-CAPILLARY: 196 mg/dL — AB (ref 65–99)
Glucose-Capillary: 122 mg/dL — ABNORMAL HIGH (ref 65–99)
Glucose-Capillary: 147 mg/dL — ABNORMAL HIGH (ref 65–99)
Glucose-Capillary: 116 mg/dL — ABNORMAL HIGH (ref 65–99)

## 2016-12-16 NOTE — Clinical Social Work Note (Signed)
The patient has chosen [facility]. The facility can receive the patient on Monday, 7/2. CSW will continue to follow; the SNF has been accepted and selected in the [place].  Santiago Bumpers, MSW, Latanya Presser [phone]

## 2016-12-16 NOTE — Plan of Care (Signed)
Problem: Activity: Goal: Risk for activity intolerance will decrease Outcome: Progressing Pt up to [facility] with 1 assist

## 2016-12-16 NOTE — Progress Notes (Signed)
[facility] [place] at [place] NAME: Darrell Clark    MR#:  [id number]  DATE OF BIRTH:  1932/09/30  SUBJECTIVE:  CHIEF COMPLAINT:   Chief Complaint  Patient presents with  . Weakness   Pt dizziness improved, Has agreed to go to skilled nursing facility   Review of Systems  Constitutional: Negative for chills, fever and weight loss.  HENT: Negative for congestion.   Eyes: Negative for blurred vision and double vision.  Respiratory: Negative for cough, sputum production, shortness of breath and wheezing.   Cardiovascular: Negative for chest pain, palpitations, orthopnea, leg swelling and PND.  Gastrointestinal: Negative for abdominal pain, blood in stool, constipation, diarrhea, nausea and vomiting.  Genitourinary: Negative for dysuria, frequency, hematuria and urgency.  Musculoskeletal: Negative for falls.  Neurological: Positive for dizziness. Negative for tremors, focal weakness and headaches.  Endo/Heme/Allergies: Does not bruise/bleed easily.  Psychiatric/Behavioral: Negative for depression. The patient does not have insomnia.     VITAL SIGNS: Blood pressure (!) 141/82, pulse 74, temperature 98.6 F (37 C), temperature source Oral, resp. rate 18, height 5\' 11"  (1.803 m), weight 221 lb 4.8 oz (100.4 kg), SpO2 100 %.  PHYSICAL EXAMINATION:   GENERAL:  83 y.o.-year-old patient lying in the bed with no acute distress.  EYES: Pupils equal, round, reactive to light and accommodation. No scleral icterus. Extraocular muscles intact.  HEENT: Head atraumatic, normocephalic. Oropharynx and nasopharynx clear.  NECK:  Supple, no jugular venous distention. No thyroid enlargement, no tenderness.  LUNGS: Normal breath sounds bilaterally, no wheezing, rales,rhonchi or crepitation. No use of accessory muscles of respiration.  CARDIOVASCULAR: S1, S2 normal. No murmurs, rubs, or gallops.  ABDOMEN: Soft, nontender, nondistended. Bowel sounds present.  No organomegaly or mass.  EXTREMITIES: No pedal edema, cyanosis, or clubbing.  NEUROLOGIC: Cranial nerves II through XII are intact. Muscle strength 5/5 in all extremities. Sensation intact. Gait not checked. Patient has decreased vision in left eye, medial visual field, peripheral vision is intact, intact vision in the right eye PSYCHIATRIC: The patient is alert and oriented x 3.  SKIN: No obvious rash, lesion, or ulcer.   ORDERS/RESULTS REVIEWED:   CBC  Recent Labs Lab 12/13/16 0628 12/14/16 0424 12/15/16 0502  WBC 6.5 6.3 6.5  HGB 17.0 16.1 16.7  HCT 49.2 46.9 47.9  PLT 173 179 178  MCV 94.9 93.1 93.6  MCH 32.7 31.9 32.6  MCHC 34.5 34.3 34.9  RDW 14.3 14.2 13.9   ------------------------------------------------------------------------------------------------------------------  Chemistries   Recent Labs Lab 12/13/16 0628 12/14/16 0424 12/15/16 0502  NA 142 139 138  K 3.4* 3.4* 3.9  CL 106 106 104  CO2 30 28 28   GLUCOSE 152* 131* 135*  BUN 12 13 16   CREATININE 0.99 0.86 1.00  CALCIUM 9.0 8.7* 8.9   ------------------------------------------------------------------------------------------------------------------  Cardiac Enzymes  Recent Labs Lab 12/13/16 0628  TROPONINI <0.03   ------------------------------------------------------------------------------------------------------------------ Invalid input(s): POCBNP ---------------------------------------------------------------------------------------------------------------  RADIOLOGY: No results found.  EKG:  Orders placed or performed during the hospital encounter of 12/13/16  . ED EKG  . ED EKG  . EKG 12-Lead  . EKG 12-Lead  . EKG 12-Lead  . EKG 12-Lead    ASSESSMENT AND PLAN:  Principal Problem:   Dizziness  * Dizziness   MRI of the brain is negative, and no signs of infection .    Likely it is from vestibular origin   Continue  Valium and meclizine oral. Appreciate neuro input,  Pt  doing input  *  Hypertension   Continue lotensin and  Norvasc .  bp now stable  * Hypokalemia - Replaced  * Diabetes   Continue glipizide and keep on sliding scale coverage here.  * History of stroke   Continue aspirin & statin  * History of coronary artery disease   Continue aspirin, simvastatin and benazepril.    Discharge to snf tomm   DRUG ALLERGIES:  Allergies  Allergen Reactions  . Niacin Itching and Rash    Other reaction(s): Unknown Other reaction(s): UNKNOWN  . Niacin And Related Rash    CODE STATUS: FULL CODE  TOTAL TIME TAKING CARE OF THIS PATIENT: 20 minutes.  Possible discharge in 1-2 days depending on clinical condition  Dustin Flock M.D on 12/16/2016 at 10:33 AM  Between 7am to 6pm - Pager - [phone]  After 6pm go to [email or web address] - password [email or web address] [place]  Office  [phone]  CC: Primary care physician; Marden Noble, MD

## 2016-12-17 ENCOUNTER — Encounter
Admission: RE | Admit: 2016-12-17 | Discharge: 2016-12-17 | Disposition: A | Discharge status: Home / Self Care | Payer: Medicare Other | Source: Ambulatory Visit | Attending: Internal Medicine | Admitting: Internal Medicine

## 2016-12-17 LAB — GLUCOSE, CAPILLARY
Glucose-Capillary: 134 mg/dL — ABNORMAL HIGH (ref 65–99)
Glucose-Capillary: 110 mg/dL — ABNORMAL HIGH (ref 65–99)

## 2016-12-17 MED ORDER — LATANOPROST 0.005 % OP SOLN: 1.0000 [drp] | Freq: Every day | OPHTHALMIC | 12 refills | Status: AC

## 2016-12-17 MED ORDER — LORAZEPAM 0.5 MG PO TABS: 0.5000 mg | ORAL_TABLET | Freq: Three times a day (TID) | ORAL | 0 refills | Status: DC

## 2016-12-17 MED ORDER — MONTELUKAST SODIUM 10 MG PO TABS: 10.0000 mg | ORAL_TABLET | Freq: Every day | ORAL | Status: DC

## 2016-12-17 MED ORDER — MECLIZINE HCL 12.5 MG PO TABS: 12.5000 mg | ORAL_TABLET | Freq: Three times a day (TID) | ORAL | 0 refills | Status: DC | PRN

## 2016-12-17 NOTE — Clinical Social Work Note (Signed)
Patient to be d/c'ed today to [facility].  Patient and family agreeable to plans will transport via daughter's car RN to call report to [phone].  Evette Cristal, MSW, [place]

## 2016-12-17 NOTE — Discharge Summary (Signed)
[facility] - [place] at [facility], 83 y.o., DOB 19-Dec-1932, MRN [id number]. Admission date: 12/13/2016 Discharge Date 12/17/2016 Primary MD Marden Noble, MD Admitting Physician Vaughan Basta, MD  Admission Diagnosis  Abdominal distention [R14.0] Dizziness [R42]  Discharge Diagnosis   Principal Problem:   Dizziness likely benign positional vertigo   Essential hypertension   Hypokalemia replace   Diabetes type 2   Previous CVA   Coronary artery disease   Hyperlipidemia   History of prostate cancer        Hospital Course  Patient is 83 year old male with known history of coronary artery disease, diabetes, GERD, hyperlipidemia, essential hypertension, previous CVA and prostate cancer who presented with complaining of dizziness with walking and certain positions. Due to this she was seen in the emergency room. Patient underwent a MRI which was negative for any stroke or any other abnormality to explain his symptoms. He was seen in consultation by neurology who recommended medical management for his symptoms. He is symptoms are significantly improved. He did complain of some visual difficulties he needs to follow up with his ophthalmologist as outpatient. Patient also was very weak and deconditioned was seen by physical therapy recommended skilled nursing facility which is currently arranged.           Consults neurology  Significant Tests:  See full reports for all details     Dg Chest 1 View  Result Date: 12/13/2016 CLINICAL DATA:  Underlies weakness for past week with a fall yesterday. The patient reports abdominal bloating. History of coronary artery disease, diabetes, CVA, discontinued smoking 10 years ago. EXAM: CHEST 1 VIEW COMPARISON:  Chest x-ray of July 27, 2016 FINDINGS: The lungs are adequately inflated and clear. The heart is top-normal in size. The pulmonary vascularity is normal. The mediastinum is normal in width. The  trachea is midline. The bony thorax exhibits no acute abnormality. IMPRESSION: There is no active cardiopulmonary disease. Electronically Signed   By: David  Martinique M.D.   On: 12/13/2016 07:05   Ct Head Wo Contrast  Result Date: 12/13/2016 CLINICAL DATA:  Generalized weakness over the last week. Fell yesterday. EXAM: CT HEAD WITHOUT CONTRAST TECHNIQUE: Contiguous axial images were obtained from the base of the skull through the vertex without intravenous contrast. COMPARISON:  06/16/2016 FINDINGS: Brain: No change. Mild age related atrophy, less than often seen at this age. Mild small vessel change of the white matter, less than often seen at this age. Punctate parenchymal calcification of the left temporal lobe, unchanged. This could be a small benign parenchymal or vascular calcification, not likely significant. No sign of acute infarction, mass lesion, hemorrhage, hydrocephalus or extra-axial collection. Vascular: There is atherosclerotic calcification of the major vessels at the base of the brain. Skull: Normal Sinuses/Orbits: Clear/normal Other: None significant IMPRESSION: No acute or traumatic finding. Mild age related atrophy and small-vessel change of the white matter, less than often seen in individuals of this age. Electronically Signed   By: Nelson Chimes M.D.   On: 12/13/2016 07:30   Mr Brain Wo Contrast  Result Date: 12/13/2016 CLINICAL DATA:  Dizziness.  Fall yesterday. EXAM: MRI HEAD WITHOUT CONTRAST TECHNIQUE: Multiplanar, multiecho pulse sequences of the brain and surrounding structures were obtained without intravenous contrast. COMPARISON:  CT head 12/13/2016 FINDINGS: Brain: Ventricle size normal. Cerebral volume normal for age. Negative for acute infarct. Patchy hyperintensity in the cerebral white matter bilaterally. Small chronic infarcts in the thalamus bilaterally. Chronic lacunar infarction right internal capsule.  Negative for hemorrhage mass or edema. No shift of the midline  structures. Vascular: Normal arterial flow voids Skull and upper cervical spine: Negative Sinuses/Orbits: Negative Other: None IMPRESSION: No acute intracranial abnormality. Chronic microvascular ischemic changes in the white matter and basal ganglia. Electronically Signed   By: Franchot Gallo M.D.   On: 12/13/2016 10:25   Dg Abd 2 Views  Result Date: 12/13/2016 CLINICAL DATA:  Abdominal distention and bloating for 6 months, some nausea recently, history of prostate carcinoma EXAM: ABDOMEN - 2 VIEW COMPARISON:  CT abdomen and pelvis of 06/02/2013 FINDINGS: Supine and sitting views of the abdomen were obtained. No bowel obstruction is seen. No free air is noted. No opaque calculi are seen. There are degenerative changes in the mid to lower lumbar spine. IMPRESSION: 1. No bowel obstruction.  No free air. 2. No opaque calculi are seen. Electronically Signed   By: Ivar Drape M.D.   On: 12/13/2016 08:35       Today   Subjective:   Darrell Clark   patient feeling well denies any complaints currently  Objective:   Blood pressure 139/72, pulse 81, temperature 98 F (36.7 C), temperature source Oral, resp. rate 18, height 5\' 11"  (1.803 m), weight 221 lb 4.8 oz (100.4 kg), SpO2 95 %.  .  Intake/Output Summary (Last 24 hours) at 12/17/16 1153 Last data filed at 12/17/16 0921  Gross per 24 hour  Intake              720 ml  Output              850 ml  Net             -130 ml    Exam VITAL SIGNS: Blood pressure 139/72, pulse 81, temperature 98 F (36.7 C), temperature source Oral, resp. rate 18, height 5\' 11"  (1.803 m), weight 221 lb 4.8 oz (100.4 kg), SpO2 95 %.  GENERAL:  83 y.o.-year-old patient lying in the bed with no acute distress.  EYES: Pupils equal, round, reactive to light and accommodation. No scleral icterus. Extraocular muscles intact.  HEENT: Head atraumatic, normocephalic. Oropharynx and nasopharynx clear.  NECK:  Supple, no jugular venous distention. No thyroid enlargement, no  tenderness.  LUNGS: Normal breath sounds bilaterally, no wheezing, rales,rhonchi or crepitation. No use of accessory muscles of respiration.  CARDIOVASCULAR: S1, S2 normal. No murmurs, rubs, or gallops.  ABDOMEN: Soft, nontender, nondistended. Bowel sounds present. No organomegaly or mass.  EXTREMITIES: No pedal edema, cyanosis, or clubbing.  NEUROLOGIC: Cranial nerves II through XII are intact. Muscle strength 5/5 in all extremities. Sensation intact. Gait not checked.  PSYCHIATRIC: The patient is alert and oriented x 3.  SKIN: No obvious rash, lesion, or ulcer.   Data Review     CBC w Diff: Lab Results  Component Value Date   WBC 6.5 12/15/2016   HGB 16.7 12/15/2016   HGB 16.2 05/28/2013   HCT 47.9 12/15/2016   HCT 48.7 05/28/2013   PLT 178 12/15/2016   PLT 174 05/28/2013   LYMPHOPCT 28.5 05/28/2013   MONOPCT 11.6 05/28/2013   EOSPCT 4.0 05/28/2013   BASOPCT 0.6 05/28/2013   CMP: Lab Results  Component Value Date   NA 138 12/15/2016   NA 141 05/28/2013   K 3.9 12/15/2016   K 3.8 05/28/2013   CL 104 12/15/2016   CL 108 (H) 05/28/2013   CO2 28 12/15/2016   CO2 28 05/28/2013   BUN 16 12/15/2016   BUN 12  05/28/2013   CREATININE 1.00 12/15/2016   CREATININE 0.93 05/28/2013   PROT 7.5 04/15/2016   PROT 7.5 05/28/2013   ALBUMIN 4.0 04/15/2016   ALBUMIN 3.7 05/28/2013   BILITOT 0.6 04/15/2016   BILITOT 0.8 05/28/2013   ALKPHOS 68 04/15/2016   ALKPHOS 102 05/28/2013   AST 21 04/15/2016   AST 26 05/28/2013   ALT 17 04/15/2016   ALT 25 05/28/2013  .  Micro Results No results found for this or any previous visit (from the past 240 hour(s)).      Code Status Orders        Start     Ordered   12/13/16 1713  Full code  Continuous     12/13/16 1712    Code Status History    Date Active Date Inactive Code Status Order ID Comments User Context   04/15/2016  4:10 AM 04/16/2016  5:29 PM Full Code [id number]  Lance Coon, MD Inpatient    Advance Directive  Documentation     Most Recent Value  Type of Advance Directive  Living will  Pre-existing out of facility DNR order (yellow form or pink MOST form)  -  "MOST" Form in Place?  -           Contact information for follow-up providers    Marden Noble, MD Follow up in 2 week(s).   Specialty:  Internal Medicine Contact information: [address] [place] [address] [phone]        primary opthamoligist Follow up in 1 week(s).   Why:  blurred vision           Contact information for after-discharge care    Destination    [facility] Follow up.   Specialty:  [place] information: [address] [place] [phone]                  Discharge Medications   Allergies as of 12/17/2016      Reactions   Niacin Itching, Rash   Other reaction(s): Unknown Other reaction(s): UNKNOWN   Niacin And Related Rash      Medication List    STOP taking these medications   lisinopril 20 MG tablet Commonly known as:  PRINIVIL,ZESTRIL     TAKE these medications   ACE BACK STABILIZER Misc Use 1 Units once daily as needed.   acetaminophen 325 MG tablet Commonly known as:  TYLENOL Take 325-650 mg by mouth every 4 (four) hours as needed.   amLODipine 5 MG tablet Commonly known as:  NORVASC Take 5 mg by mouth daily.   aspirin 81 MG EC tablet Take 1 tablet (81 mg total) by mouth daily.   benazepril 20 MG tablet Commonly known as:  LOTENSIN Take 20 mg by mouth daily.   clopidogrel 75 MG tablet Commonly known as:  PLAVIX Take 1 tablet (75 mg total) by mouth daily.   docusate sodium 100 MG capsule Commonly known as:  COLACE Take 100 mg by mouth 2 (two) times daily. As needed.   donepezil 10 MG tablet Commonly known as:  ARICEPT Take 10 mg by mouth at bedtime.   fluticasone 50 MCG/ACT nasal spray Commonly known as:  FLONASE Place 2 sprays into both nostrils daily.   glipiZIDE 2.5 MG  24 hr tablet Commonly known as:  GLUCOTROL XL Take 1 tablet (2.5 mg total) by mouth daily with breakfast.   latanoprost 0.005 % ophthalmic solution Commonly known as:  Tax inspector  1 drop into the right eye at bedtime.   LORazepam 0.5 MG tablet Commonly known as:  ATIVAN Take 1 tablet (0.5 mg total) by mouth every 8 (eight) hours.   meclizine 12.5 MG tablet Commonly known as:  ANTIVERT Take 1 tablet (12.5 mg total) by mouth 3 (three) times daily as needed for dizziness or nausea.   montelukast 10 MG tablet Commonly known as:  SINGULAIR Take 1 tablet (10 mg total) by mouth at bedtime.   NEOMYCIN-POLYMYXIN-HYDROCORTISONE 1 % Soln OTIC solution Commonly known as:  CORTISPORIN Apply 1-2 drops to toe BID after soaking   nitroGLYCERIN 0.4 MG SL tablet Commonly known as:  NITROSTAT Place under the tongue.   ONE TOUCH ULTRA TEST test strip Generic drug:  glucose blood   ONETOUCH DELICA LANCETS 51T Misc   OSTEO BI-FLEX/5-LOXIN ADVANCED PO Take by mouth. Pt takes 2 tablets daily   pantoprazole 40 MG tablet Commonly known as:  PROTONIX Take 40 mg by mouth daily.   simethicone 80 MG chewable tablet Commonly known as:  MYLICON Chew 80 mg by mouth every 6 (six) hours as needed. As needed.   simvastatin 20 MG tablet Commonly known as:  ZOCOR Take 20 mg by mouth daily at 6 PM.          Total Time in preparing paper work, data evaluation and todays exam - 35 minutes  Dustin Flock M.D on 12/17/2016 at 11:53 AM  [facility]   Office  [phone]

## 2016-12-17 NOTE — Progress Notes (Signed)
Pt discharged via his daughter to [facility]. Report called to accepting RN. IV removed, pt on room air and in no distress. Ammie Dalton, RN

## 2016-12-17 NOTE — Discharge Instructions (Addendum)
[place] at [place]:  Cardiac diet, carbohydrate diet  DISCHARGE CONDITION:  Stable  ACTIVITY:  Activity as tolerated  OXYGEN:  Home Oxygen: No.   Oxygen Delivery: room air  DISCHARGE LOCATION:  nursing home    ADDITIONAL DISCHARGE INSTRUCTION:   If you experience worsening of your admission symptoms, develop shortness of breath, life threatening emergency, suicidal or homicidal thoughts you must seek medical attention immediately by calling 911 or calling your MD immediately  if symptoms less severe.  You Must read complete instructions/literature along with all the possible adverse reactions/side effects for all the Medicines you take and that have been prescribed to you. Take any new Medicines after you have completely understood and accpet all the possible adverse reactions/side effects.   Please note  You were cared for by a hospitalist during your hospital stay. If you have any questions about your discharge medications or the care you received while you were in the hospital after you are discharged, you can call the unit and asked to speak with the hospitalist on call if the hospitalist that took care of you is not available. Once you are discharged, your primary care physician will handle any further medical issues. Please note that NO REFILLS for any discharge medications will be authorized once you are discharged, as it is imperative that you return to your primary care physician (or establish a relationship with a primary care physician if you do not have one) for your aftercare needs so that they can reassess your need for medications and monitor your lab values.

## 2016-12-18 DIAGNOSIS — F325 Major depressive disorder, single episode, in full remission: Secondary | ICD-10-CM | POA: Insufficient documentation

## 2016-12-18 DIAGNOSIS — F321 Major depressive disorder, single episode, moderate: Secondary | ICD-10-CM | POA: Insufficient documentation

## 2016-12-25 ENCOUNTER — Encounter: Payer: Self-pay | Admitting: Gerontology

## 2016-12-25 ENCOUNTER — Non-Acute Institutional Stay (SKILLED_NURSING_FACILITY): Payer: Medicare Other | Admitting: Gerontology

## 2016-12-25 DIAGNOSIS — R531 Weakness: Secondary | ICD-10-CM

## 2016-12-25 DIAGNOSIS — M5416 Radiculopathy, lumbar region: Secondary | ICD-10-CM

## 2016-12-26 NOTE — Progress Notes (Signed)
Location:   [facility] Room Number: [place] of Service:  SNF [phone]) Provider:  Toni Arthurs, NP-C  Marden Noble, MD  Patient Care Team: Marden Noble, MD as PCP - General (Internal Medicine)  Extended Emergency Contact Information Primary Emergency Contact: Gretchen Short [place] Mobile Phone: [phone] Relation: Daughter Secondary Emergency Contact: Dobbs,Chris  [place] Phone: [phone] Relation: Son  Code Status:  Full Goals of care: Advanced Directive information Advanced Directives 12/26/2016  Does Patient Have a Medical Advance Directive? No  Type of Advance Directive -  Does patient want to make changes to medical advance directive? -  Copy of [place] in Chart? -  Would patient like information on creating a medical advance directive? -     Chief Complaint  Patient presents with  . Acute Visit    Follow up on pain in groin    HPI:  Pt is a 83 y.o. male seen today for an acute visit for left groin pain. Pt c/o pain that starts in the lower back, through the groin and down the left leg, including the great toe. Pt denies numbness or tingling of the leg/foot. Pt denies fall/ trauma. Describes the pain as burning, ache. He does report a h/o back pain. He has been working with PT and OT for increased strengthening, increased mobility and generalized weakness.  No rashes or skin irritation. VSS. No other complaints.    Past Medical History:  Diagnosis Date  . Allergy   . Anxiety   . Arthritis   . Atherosclerosis   . CAD (coronary artery disease)   . Diabetes mellitus without complication ([place])   . GERD (gastroesophageal reflux disease)   . HLD (hyperlipidemia)   . Hyperlipidemia, unspecified   . Hypertension   . Prostate cancer ([place])   . PVD (peripheral vascular disease) ([place])   . Spinal stenosis of lumbar region with radiculopathy 11/11/2015  . Stroke [facility])     Past Surgical History:  Procedure Laterality Date  . APPENDECTOMY    . CARDIAC CATHETERIZATION    . RADIATION FOR PROSTATE CANCER      Allergies  Allergen Reactions  . Niacin Itching and Rash    Other reaction(s): Unknown Other reaction(s): UNKNOWN  . Niacin And Related Rash    Allergies as of 12/25/2016      Reactions   Niacin Itching, Rash   Other reaction(s): Unknown Other reaction(s): UNKNOWN   Niacin And Related Rash      Medication List       Accurate as of 12/25/16 11:59 PM. Always use your most recent med list.          acetaminophen 325 MG tablet Commonly known as:  TYLENOL Take 325-650 mg by mouth every 4 (four) hours as needed for fever or headache.   amLODipine 5 MG tablet Commonly known as:  NORVASC Take 5 mg by mouth daily.   aspirin 81 MG EC tablet Take 1 tablet (81 mg total) by mouth daily.   benazepril 20 MG tablet Commonly known as:  LOTENSIN Take 20 mg by mouth daily.   clopidogrel 75 MG tablet Commonly known as:  PLAVIX Take 75 mg by mouth daily.   docusate sodium 100 MG capsule Commonly known as:  COLACE Take 100 mg by mouth 2 (two) times daily as needed. As needed.   donepezil 10 MG tablet Commonly known as:  ARICEPT Take 10 mg by mouth  at bedtime.   escitalopram 10 MG tablet Commonly known as:  LEXAPRO Take 10 mg by mouth daily.   fluticasone 50 MCG/ACT nasal spray Commonly known as:  FLONASE Place 2 sprays into both nostrils daily.   glipiZIDE 2.5 MG 24 hr tablet Commonly known as:  GLUCOTROL XL Take 1 tablet (2.5 mg total) by mouth daily with breakfast.   latanoprost 0.005 % ophthalmic solution Commonly known as:  XALATAN Place 1 drop into the right eye at bedtime.   LORazepam 0.5 MG tablet Commonly known as:  ATIVAN Take 1 tablet (0.5 mg total) by mouth every 8 (eight) hours.   meclizine 12.5 MG tablet Commonly known as:  ANTIVERT Take 1 tablet (12.5 mg total) by mouth 3 (three) times daily as needed for  dizziness or nausea.   montelukast 10 MG tablet Commonly known as:  SINGULAIR Take 10 mg by mouth daily.   nitroGLYCERIN 0.4 MG SL tablet Commonly known as:  NITROSTAT Place 0.4 mg under the tongue every 5 (five) minutes as needed. For chest pain. Give up to 3 doses every 5 minutes as needed   OSTEO BI-FLEX TRIPLE STRENGTH Tabs Take 2 tablets by mouth daily.   pantoprazole 40 MG tablet Commonly known as:  PROTONIX Take 40 mg by mouth daily.   simethicone 80 MG chewable tablet Commonly known as:  MYLICON Chew 80 mg by mouth every 6 (six) hours as needed. As needed.   simvastatin 20 MG tablet Commonly known as:  ZOCOR Take 20 mg by mouth daily.       Review of Systems  Constitutional: Negative for activity change, appetite change, chills, diaphoresis and fever.  HENT: Negative for congestion, sneezing, sore throat, trouble swallowing and voice change.   Respiratory: Negative for apnea, cough, choking, chest tightness, shortness of breath and wheezing.   Cardiovascular: Negative for chest pain, palpitations and leg swelling.  Gastrointestinal: Negative for abdominal distention, abdominal pain, constipation, diarrhea and nausea.  Genitourinary: Negative for difficulty urinating, dysuria, frequency and urgency.  Musculoskeletal: Positive for arthralgias (typical arthritis), back pain and myalgias. Negative for gait problem.  Skin: Negative for color change, pallor, rash and wound.  Neurological: Negative for dizziness, tremors, syncope, speech difficulty, weakness, numbness and headaches.  Psychiatric/Behavioral: Negative for agitation and behavioral problems.  All other systems reviewed and are negative.    There is no immunization history on file for this patient. Pertinent  Health Maintenance Due  Topic Date Due  . FOOT EXAM  08/25/1942  . OPHTHALMOLOGY EXAM  08/25/1942  . PNA vac Low Risk Adult (1 of 2 - PCV13) 08/26/1999  . HEMOGLOBIN A1C  10/14/2016  . INFLUENZA  VACCINE  01/16/2017   Fall Risk  10/04/2016 05/08/2016 05/07/2016  Falls in the past year? Yes Yes Yes  Number falls in past yr: 1 - 1  Injury with Fall? No - No  Risk for fall due to : Impaired balance/gait;Impaired vision - Impaired balance/gait;Impaired vision  Follow up Falls prevention discussed - Falls prevention discussed   Functional Status Survey:    Vitals:   12/25/16 1623  BP: 130/70  Pulse: 69  Resp: 20  Temp: 98 F (36.7 C)  SpO2: 99%  Weight: 218 lb 9.6 oz (99.2 kg)  Height: 5\' 11"  (1.803 m)   Body mass index is 30.49 kg/m. Physical Exam  Constitutional: He is oriented to person, place, and time. Vital signs are normal. He appears well-developed and well-nourished. He is active and cooperative. He does not appear ill.  No distress.  HENT:  Head: Normocephalic and atraumatic.  Mouth/Throat: Uvula is midline, oropharynx is clear and moist and mucous membranes are normal. Mucous membranes are not pale, not dry and not cyanotic.  Eyes: Pupils are equal, round, and reactive to light. Conjunctivae, EOM and lids are normal.  Neck: Trachea normal, normal range of motion and full passive range of motion without pain. Neck supple. No JVD present. No tracheal deviation, no edema and no erythema present. No thyromegaly present.  Cardiovascular: Normal rate, regular rhythm, intact distal pulses and normal pulses.  Exam reveals no gallop, no distant heart sounds and no friction rub.   Murmur heard. Pulses:      Dorsalis pedis pulses are 2+ on the right side, and 2+ on the left side.  Pulmonary/Chest: Effort normal and breath sounds normal. No accessory muscle usage. No respiratory distress. He has no decreased breath sounds. He has no wheezes. He has no rhonchi. He has no rales. He exhibits no tenderness.  Abdominal: Normal appearance and bowel sounds are normal. He exhibits no distension and no ascites. There is no tenderness.  Musculoskeletal: Normal range of motion. He exhibits  no edema.       Left hip: He exhibits tenderness.       Lumbar back: He exhibits pain and spasm.  Expected osteoarthritis, stiffness; calves soft, supple. Negative Homan's sign  Neurological: He is alert and oriented to person, place, and time. He has normal strength.  Skin: Skin is warm, dry and intact. No rash noted. He is not diaphoretic. No cyanosis or erythema. No pallor. Nails show no clubbing.  Psychiatric: He has a normal mood and affect. His speech is normal and behavior is normal. Judgment and thought content normal. Cognition and memory are normal.  Nursing note and vitals reviewed.   Labs reviewed:  Recent Labs  12/13/16 0628 12/14/16 0424 12/15/16 0502  NA 142 139 138  K 3.4* 3.4* 3.9  CL 106 106 104  CO2 30 28 28   GLUCOSE 152* 131* 135*  BUN 12 13 16   CREATININE 0.99 0.86 1.00  CALCIUM 9.0 8.7* 8.9    Recent Labs  04/15/16 0007  AST 21  ALT 17  ALKPHOS 68  BILITOT 0.6  PROT 7.5  ALBUMIN 4.0    Recent Labs  12/13/16 0628 12/14/16 0424 12/15/16 0502  WBC 6.5 6.3 6.5  HGB 17.0 16.1 16.7  HCT 49.2 46.9 47.9  MCV 94.9 93.1 93.6  PLT 173 179 178   Lab Results  Component Value Date   TSH 1.879 12/14/2016   Lab Results  Component Value Date   HGBA1C 5.9 (H) 04/15/2016   Lab Results  Component Value Date   CHOL 93 04/15/2016   HDL 31 (L) 04/15/2016   LDLCALC 47 04/15/2016   TRIG 74 04/15/2016   CHOLHDL 3.0 04/15/2016    Significant Diagnostic Results in last 30 days:  Dg Chest 1 View  Result Date: 12/13/2016 CLINICAL DATA:  Underlies weakness for past week with a fall yesterday. The patient reports abdominal bloating. History of coronary artery disease, diabetes, CVA, discontinued smoking 10 years ago. EXAM: CHEST 1 VIEW COMPARISON:  Chest x-ray of July 27, 2016 FINDINGS: The lungs are adequately inflated and clear. The heart is top-normal in size. The pulmonary vascularity is normal. The mediastinum is normal in width. The trachea is  midline. The bony thorax exhibits no acute abnormality. IMPRESSION: There is no active cardiopulmonary disease. Electronically Signed   By: David  Martinique M.D.  On: 12/13/2016 07:05   Ct Head Wo Contrast  Result Date: 12/13/2016 CLINICAL DATA:  Generalized weakness over the last week. Fell yesterday. EXAM: CT HEAD WITHOUT CONTRAST TECHNIQUE: Contiguous axial images were obtained from the base of the skull through the vertex without intravenous contrast. COMPARISON:  06/16/2016 FINDINGS: Brain: No change. Mild age related atrophy, less than often seen at this age. Mild small vessel change of the white matter, less than often seen at this age. Punctate parenchymal calcification of the left temporal lobe, unchanged. This could be a small benign parenchymal or vascular calcification, not likely significant. No sign of acute infarction, mass lesion, hemorrhage, hydrocephalus or extra-axial collection. Vascular: There is atherosclerotic calcification of the major vessels at the base of the brain. Skull: Normal Sinuses/Orbits: Clear/normal Other: None significant IMPRESSION: No acute or traumatic finding. Mild age related atrophy and small-vessel change of the white matter, less than often seen in individuals of this age. Electronically Signed   By: Nelson Chimes M.D.   On: 12/13/2016 07:30   Mr Brain Wo Contrast  Result Date: 12/13/2016 CLINICAL DATA:  Dizziness.  Fall yesterday. EXAM: MRI HEAD WITHOUT CONTRAST TECHNIQUE: Multiplanar, multiecho pulse sequences of the brain and surrounding structures were obtained without intravenous contrast. COMPARISON:  CT head 12/13/2016 FINDINGS: Brain: Ventricle size normal. Cerebral volume normal for age. Negative for acute infarct. Patchy hyperintensity in the cerebral white matter bilaterally. Small chronic infarcts in the thalamus bilaterally. Chronic lacunar infarction right internal capsule. Negative for hemorrhage mass or edema. No shift of the midline structures.  Vascular: Normal arterial flow voids Skull and upper cervical spine: Negative Sinuses/Orbits: Negative Other: None IMPRESSION: No acute intracranial abnormality. Chronic microvascular ischemic changes in the white matter and basal ganglia. Electronically Signed   By: Franchot Gallo M.D.   On: 12/13/2016 10:25   Dg Abd 2 Views  Result Date: 12/13/2016 CLINICAL DATA:  Abdominal distention and bloating for 6 months, some nausea recently, history of prostate carcinoma EXAM: ABDOMEN - 2 VIEW COMPARISON:  CT abdomen and pelvis of 06/02/2013 FINDINGS: Supine and sitting views of the abdomen were obtained. No bowel obstruction is seen. No free air is noted. No opaque calculi are seen. There are degenerative changes in the mid to lower lumbar spine. IMPRESSION: 1. No bowel obstruction.  No free air. 2. No opaque calculi are seen. Electronically Signed   By: Ivar Drape M.D.   On: 12/13/2016 08:35    Assessment/Plan 1. Lumbar radiculopathy, acute  Gabapentin 100 mg po TID  Stretching exercises  Ice to back/ hip prn  Tramadol 50 mg po Q 6 hours prn  2. Generalized Weakness  PT/OT for strengthening  Ambulate with assistance  Fall precautions  Family/ staff Communication:   Total Time:  Documentation:  Face to Face:  Family/Phone:   Labs/tests ordered:    Medication list reviewed and assessed for continued appropriateness.  Vikki Ports, NP-C Geriatrics [facility] [phone], [place] [address] Cell Phone (Mon-Fri 8am-5pm):  [phone] On Call:  [phone] & follow prompts after 5pm & weekends Office Phone:  [phone] Office Fax:  [phone]

## 2016-12-30 ENCOUNTER — Encounter: Payer: Self-pay | Admitting: Emergency Medicine

## 2016-12-30 ENCOUNTER — Emergency Department
Admission: EM | Admit: 2016-12-30 | Discharge: 2016-12-30 | Disposition: A | Discharge status: Home / Self Care | Payer: Medicare Other | Attending: Emergency Medicine | Admitting: Emergency Medicine

## 2016-12-30 ENCOUNTER — Emergency Department: Payer: Medicare Other

## 2016-12-30 DIAGNOSIS — Z7902 Long term (current) use of antithrombotics/antiplatelets: Secondary | ICD-10-CM | POA: Insufficient documentation

## 2016-12-30 DIAGNOSIS — R0981 Nasal congestion: Secondary | ICD-10-CM | POA: Diagnosis not present

## 2016-12-30 DIAGNOSIS — I2510 Atherosclerotic heart disease of native coronary artery without angina pectoris: Secondary | ICD-10-CM | POA: Insufficient documentation

## 2016-12-30 DIAGNOSIS — M1612 Unilateral primary osteoarthritis, left hip: Secondary | ICD-10-CM | POA: Insufficient documentation

## 2016-12-30 DIAGNOSIS — N179 Acute kidney failure, unspecified: Secondary | ICD-10-CM | POA: Insufficient documentation

## 2016-12-30 DIAGNOSIS — M62838 Other muscle spasm: Secondary | ICD-10-CM | POA: Insufficient documentation

## 2016-12-30 DIAGNOSIS — Z7982 Long term (current) use of aspirin: Secondary | ICD-10-CM | POA: Diagnosis not present

## 2016-12-30 DIAGNOSIS — Z87891 Personal history of nicotine dependence: Secondary | ICD-10-CM | POA: Diagnosis not present

## 2016-12-30 DIAGNOSIS — E119 Type 2 diabetes mellitus without complications: Secondary | ICD-10-CM | POA: Insufficient documentation

## 2016-12-30 DIAGNOSIS — Z79899 Other long term (current) drug therapy: Secondary | ICD-10-CM | POA: Insufficient documentation

## 2016-12-30 DIAGNOSIS — I10 Essential (primary) hypertension: Secondary | ICD-10-CM | POA: Insufficient documentation

## 2016-12-30 DIAGNOSIS — Z8546 Personal history of malignant neoplasm of prostate: Secondary | ICD-10-CM | POA: Insufficient documentation

## 2016-12-30 DIAGNOSIS — R1030 Lower abdominal pain, unspecified: Secondary | ICD-10-CM | POA: Diagnosis present

## 2016-12-30 HISTORY — DX: Dizziness and giddiness: R42

## 2016-12-30 LAB — URINALYSIS, COMPLETE (UACMP) WITH MICROSCOPIC
Bacteria, UA: NONE SEEN
Bilirubin Urine: NEGATIVE
GLUCOSE, UA: NEGATIVE mg/dL
HGB URINE DIPSTICK: NEGATIVE
Ketones, ur: NEGATIVE mg/dL
Leukocytes, UA: NEGATIVE
NITRITE: NEGATIVE
PH: 5 (ref 5.0–8.0)
Protein, ur: NEGATIVE mg/dL
RBC / HPF: NONE SEEN RBC/hpf (ref 0–5)
SPECIFIC GRAVITY, URINE: 1.008 (ref 1.005–1.030)
Squamous Epithelial / LPF: NONE SEEN

## 2016-12-30 LAB — CBC WITH DIFFERENTIAL/PLATELET
BASOS PCT: 1 %
Basophils Absolute: 0 10*3/uL (ref 0–0.1)
EOS ABS: 0.3 10*3/uL (ref 0–0.7)
Eosinophils Relative: 4 %
HCT: 48.5 % (ref 40.0–52.0)
HEMOGLOBIN: 16.7 g/dL (ref 13.0–18.0)
Lymphocytes Relative: 28 %
Lymphs Abs: 1.8 10*3/uL (ref 1.0–3.6)
MCH: 32.7 pg (ref 26.0–34.0)
MCHC: 34.4 g/dL (ref 32.0–36.0)
MCV: 95.2 fL (ref 80.0–100.0)
MONOS PCT: 10 %
Monocytes Absolute: 0.7 10*3/uL (ref 0.2–1.0)
NEUTROS PCT: 57 %
Neutro Abs: 3.8 10*3/uL (ref 1.4–6.5)
Platelets: 173 10*3/uL (ref 150–440)
RBC: 5.1 MIL/uL (ref 4.40–5.90)
RDW: 13.7 % (ref 11.5–14.5)
WBC: 6.7 10*3/uL (ref 3.8–10.6)

## 2016-12-30 LAB — COMPREHENSIVE METABOLIC PANEL
ALK PHOS: 83 U/L (ref 38–126)
ALT: 18 U/L (ref 17–63)
AST: 17 U/L (ref 15–41)
Albumin: 4 g/dL (ref 3.5–5.0)
Anion gap: 5 (ref 5–15)
BILIRUBIN TOTAL: 0.8 mg/dL (ref 0.3–1.2)
BUN: 20 mg/dL (ref 6–20)
CALCIUM: 9 mg/dL (ref 8.9–10.3)
CO2: 29 mmol/L (ref 22–32)
CREATININE: 1.29 mg/dL — AB (ref 0.61–1.24)
Chloride: 104 mmol/L (ref 101–111)
GFR, EST AFRICAN AMERICAN: 58 mL/min — AB (ref >60)
GFR, EST NON AFRICAN AMERICAN: 50 mL/min — AB (ref >60)
Glucose, Bld: 97 mg/dL (ref 65–99)
Potassium: 4.5 mmol/L (ref 3.5–5.1)
Sodium: 138 mmol/L (ref 135–145)
Total Protein: 7.1 g/dL (ref 6.5–8.1)

## 2016-12-30 MED ORDER — SODIUM CHLORIDE 0.9 % IV SOLN
Freq: Once | INTRAVENOUS | Status: AC
  Administered 2016-12-30: 15:00:00 via INTRAVENOUS

## 2016-12-30 MED ORDER — IBUPROFEN 600 MG PO TABS
600.0000 mg | ORAL_TABLET | Freq: Once | ORAL | Status: AC
  Administered 2016-12-30: 600 mg via ORAL
  Filled 2016-12-30: qty 1

## 2016-12-30 MED ORDER — CYCLOBENZAPRINE HCL 10 MG PO TABS: 10.0000 mg | ORAL_TABLET | Freq: Three times a day (TID) | ORAL | 0 refills | Status: DC | PRN

## 2016-12-30 MED ORDER — CYCLOBENZAPRINE HCL 10 MG PO TABS
10.0000 mg | ORAL_TABLET | Freq: Once | ORAL | Status: AC
  Administered 2016-12-30: 10 mg via ORAL
  Filled 2016-12-30: qty 1

## 2016-12-30 NOTE — ED Notes (Signed)
D/c after fluids

## 2016-12-30 NOTE — ED Provider Notes (Signed)
[facility] Emergency Department Provider Note ____________________________________________   I have reviewed the triage vital signs and the triage nursing note.  HISTORY  Chief Complaint Groin Pain   Historian Level V caveat: Limited history from patient, poor historian.  HPI Darrell Clark is a 83 y.o. male presents from nursing home where he was recently placed due to inability to live on his own especially with respect to fairly recent loss of spouse, not really taking care of self at home, and an episode of significant vertigo for which he was admitted to the hospital at the beginning of the month.  Patient is a historian, but it sounds to me like he was on the phone with his son and based on their conversation he requested to come to the emergency department for evaluation. Patient states that he told a sign that his left leg has been bothering him for one year and that he just feels weak all over.  Denies dysuria. Denies abdominal pain. Denies vomiting or diarrhea. Denies trouble breathing. Denies weakness or numbness.  States that his left leg hurts when he moves it and sometimes when he is lying down. Currently it is giving him spasms and intermittently hurts severely and then does not hurt.    Past Medical History:  Diagnosis Date  . Allergy   . Anxiety   . Arthritis   . Atherosclerosis   . CAD (coronary artery disease)   . Diabetes mellitus without complication ([place])   . GERD (gastroesophageal reflux disease)   . HLD (hyperlipidemia)   . Hyperlipidemia, unspecified   . Hypertension   . Prostate cancer ([place])   . PVD (peripheral vascular disease) ([place])   . Spinal stenosis of lumbar region with radiculopathy 11/11/2015  . Stroke ([place])   . Vertigo     Patient Active Problem List   Diagnosis Date Noted  . Dizziness 12/13/2016  . Vision, loss, sudden 04/16/2016  . 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 Intracranial vascular stenosis 04/16/2016  . CVA (cerebral vascular accident) ([place]) 04/15/2016  . HTN (hypertension) 04/15/2016  . Diabetes ([place]) 04/15/2016  . GERD (gastroesophageal reflux disease) 04/15/2016  . HLD (hyperlipidemia) 04/15/2016    Past Surgical History:  Procedure Laterality Date  . APPENDECTOMY    . CARDIAC CATHETERIZATION    . RADIATION FOR PROSTATE CANCER      Prior to Admission medications   Medication Sig Start Date End Date Taking? Authorizing Provider  acetaminophen (TYLENOL) 325 MG tablet Take 325-650 mg by mouth every 4 (four) hours as needed for fever or headache.     [provider]  amLODipine (NORVASC) 5 MG tablet Take 5 mg by mouth daily.  02/21/13   [provider]  aspirin EC 81 MG EC tablet Take 1 tablet (81 mg total) by mouth daily. 04/16/16   Theodoro Grist, MD  benazepril (LOTENSIN) 20 MG tablet Take 20 mg by mouth daily.     [provider]  clopidogrel (PLAVIX) 75 MG tablet Take 75 mg by mouth daily.    [provider]  cyclobenzaprine (FLEXERIL) 10 MG tablet Take 1 tablet (10 mg total) by mouth 3 (three) times daily as needed for muscle spasms. 12/30/16   Lisa Roca, MD  docusate sodium (COLACE) 100 MG capsule Take 100 mg by mouth 2 (two) times daily as needed. As needed.     [provider]  donepezil (ARICEPT) 10 MG tablet Take 10  mg by mouth at bedtime.  02/21/13   [provider]  escitalopram (LEXAPRO) 10 MG tablet Take 10 mg by mouth daily.    [provider]  fluticasone (FLONASE) 50 MCG/ACT nasal spray Place 2 sprays into both nostrils daily.  04/16/13   [provider]  glipiZIDE (GLUCOTROL XL) 2.5 MG 24 hr tablet Take 1 tablet (2.5 mg total) by mouth daily with breakfast. 04/16/16   Theodoro Grist, MD  latanoprost (XALATAN) 0.005 % ophthalmic solution Place 1 drop into the right eye at bedtime. 12/17/16   Dustin Flock, MD  LORazepam (ATIVAN) 0.5 MG  tablet Take 1 tablet (0.5 mg total) by mouth every 8 (eight) hours. 12/17/16   Dustin Flock, MD  meclizine (ANTIVERT) 12.5 MG tablet Take 1 tablet (12.5 mg total) by mouth 3 (three) times daily as needed for dizziness or nausea. 12/17/16   Dustin Flock, MD  Misc Natural Products (OSTEO BI-FLEX TRIPLE STRENGTH) TABS Take 2 tablets by mouth daily.    [provider]  montelukast (SINGULAIR) 10 MG tablet Take 10 mg by mouth daily.    [provider]  nitroGLYCERIN (NITROSTAT) 0.4 MG SL tablet Place 0.4 mg under the tongue every 5 (five) minutes as needed. For chest pain. Give up to 3 doses every 5 minutes as needed 11/20/13   [provider]  pantoprazole (PROTONIX) 40 MG tablet Take 40 mg by mouth daily.  08/06/13   [provider]  simethicone (MYLICON) 80 MG chewable tablet Chew 80 mg by mouth every 6 (six) hours as needed. As needed.     [provider]  simvastatin (ZOCOR) 20 MG tablet Take 20 mg by mouth daily.  03/09/13   [provider]    Allergies  Allergen Reactions  . Niacin Itching and Rash    Other reaction(s): Unknown Other reaction(s): UNKNOWN  . Niacin And Related Rash    Family History  Problem Relation Age of Onset  . Heart attack Mother   . Hypertension Mother   . Heart attack Father   . Hypertension Father   . Hypertension Sister   . Cancer Brother   . Hypertension Brother   . Hypertension Brother   . Hypertension Brother   . Hypertension Sister     Social History Social History  Substance Use Topics  . Smoking status: Former Smoker    Packs/day: 1.00    Years: 50.00    Types: Cigarettes    Quit date: 11/19/2007  . Smokeless tobacco: Never Used  . Alcohol use No    Review of Systems  Constitutional: Negative for fever. Eyes: Negative for visual changes. ENT: Negative for sore throat. Cardiovascular: Negative for chest pain. Respiratory: Negative for shortness of breath. Gastrointestinal: Negative  for abdominal pain, vomiting and diarrhea. Genitourinary: Negative for dysuria. Musculoskeletal: Negative for back pain. Skin: Negative for rash. Neurological: Negative for headache.  ____________________________________________   PHYSICAL EXAM:  VITAL SIGNS: ED Triage Vitals  Enc Vitals Group     BP 12/30/16 1208 123/71     Pulse Rate 12/30/16 1208 61     Resp 12/30/16 1208 16     Temp 12/30/16 1208 97.9 F (36.6 C)     Temp Source 12/30/16 1208 Oral     SpO2 12/30/16 1208 97 %     Weight 12/30/16 1210 218 lb (98.9 kg)     Height 12/30/16 1210 5\' 11"  (1.803 m)     Head Circumference --      Peak Flow --  Pain Score 12/30/16 1204 0     Pain Loc --      Pain Edu? --      Excl. in [place]? --      Constitutional: Alert and cooperative, but poor historian. Well appearing and in no distress. HEENT   Head: Normocephalic and atraumatic.      Eyes: Conjunctivae are normal. Pupils equal and round.       Ears:         Nose: No congestion/rhinnorhea.   Mouth/Throat: Mucous membranes are moist.   Neck: No stridor. Cardiovascular/Chest: Normal rate, regular rhythm.  No murmurs, rubs, or gallops. Respiratory: Normal respiratory effort without tachypnea nor retractions. Breath sounds are clear and equal bilaterally. No wheezes/rales/rhonchi. Gastrointestinal: Soft. No distention, no guarding, no rebound. Nontender.   Genitourinary/rectal:Deferred Musculoskeletal: Pelvis stable.  Pain with movement of left leg, but he is able to bring to bended position.  Intermittent muscle spasm of thigh.  No joint effusions.  No lower extremity tenderness.  No edema. Neurologic:  Normal speech and language. No gross or focal neurologic deficits are appreciated. Skin:  Skin is warm, dry and intact. No rash noted. Psychiatric: No active psychosis.   ____________________________________________  LABS (pertinent positives/negatives)  Labs Reviewed  URINALYSIS, COMPLETE (UACMP) WITH  MICROSCOPIC - Abnormal; Notable for the following:       Result Value   Color, Urine STRAW (*)    APPearance CLEAR (*)    All other components within normal limits  COMPREHENSIVE METABOLIC PANEL - Abnormal; Notable for the following:    Creatinine, Ser 1.29 (*)    GFR calc non Af Amer 50 (*)    GFR calc Af Amer 58 (*)    All other components within normal limits  CBC WITH DIFFERENTIAL/PLATELET    ____________________________________________    EKG I, Lisa Roca, MD, the attending physician have personally viewed and interpreted all ECGs.  56 bpm. Normal sinus rhythm. Left axis deviation. Nonspecific ST and T-wave ____________________________________________  RADIOLOGY All Xrays were viewed by me. Imaging interpreted by Radiologist.  Left hip and pelvis xray: IMPRESSION: No acute abnormality.  Moderate to moderately severe left hip osteoarthritis. __________________________________________  PROCEDURES  Procedure(s) performed: None  Critical Care performed: None  ____________________________________________   ED COURSE / ASSESSMENT AND PLAN  Pertinent labs & imaging results that were available during my care of the patient were reviewed by me and considered in my medical decision making (see chart for details).   Mr. Masterson is a fairly difficult historian, and on exam the only thing that he seems to be complaining about is pain in his left groin down to the knee where he is having palpable and tender soft tissues and muscle spasm across the quadriceps. I will go ahead and image to make sure there is no pelvis or hip fracture although there is no report of such a traumatic injury.  Daughter did show up and was concerned about the patient having some sinus congestion although without fevers or nasal drainage. He started on Claritin.  X-ray is overall reassuring for no fracture. He does have osteoporosis of the left hip and I discussed that he should make an appointment  with an orthopedic surgeon.  Given the finding of mild acute renal failure, patient was given Benadryl IV fluids here in the emergency department. I did recommend that he is over-the-counter Tylenol as opposed ibuprofen for symptomatic relief right now.    CONSULTATIONS:   None   Patient / Family / Caregiver  informed of clinical course, medical decision-making process, and agree with plan.   I discussed return precautions, follow-up instructions, and discharge instructions with patient and/or family.  Discharge Instructions : You were evaluated for left leg pain, and your exam and evaluation are overall reassuring in the emergency department today. Your x-ray does show osteoarthritis of the left hip. I'm also suspicious that he may have a pinched nerve, and you should follow-up with an orthopedic surgeon. In terms of the sinus congestion, please follow with the private care doctor and make an appointment with an ENT specialist.  Return to the emergency department immediately for any worsening symptoms including numbness, weakness, worsening pain, incontinence, headache, confusion or altered mental status, or any other symptoms concerning to you.  ___________________________________________   FINAL CLINICAL IMPRESSION(S) / ED DIAGNOSES   Final diagnoses:  Acute renal failure, unspecified acute renal failure type ([place])  Sinus congestion  Muscle spasm of left lower extremity  Osteoarthritis of left hip, unspecified osteoarthritis type              Note: This dictation was prepared with Dragon dictation. Any transcriptional errors that result from this process are unintentional    Lisa Roca, MD 12/30/16 1550

## 2016-12-30 NOTE — ED Notes (Addendum)
Seen on 7/3 [facility] for his depression and weakness - placed on lexapro

## 2016-12-30 NOTE — Discharge Instructions (Signed)
You were evaluated for left leg pain, and your exam and evaluation are overall reassuring in the emergency department today. Your x-ray does show osteoarthritis of the left hip. I'm also suspicious that he may have a pinched nerve, and you should follow-up with an orthopedic surgeon. In terms of the sinus congestion, please follow with the private care doctor and make an appointment with an ENT specialist.  Return to the emergency department immediately for any worsening symptoms including numbness, weakness, worsening pain, incontinence, headache, confusion or altered mental status, or any other symptoms concerning to you.

## 2016-12-30 NOTE — ED Triage Notes (Signed)
Pt sent in by son from [facility] per [facility] with multiple c/o: son wants him checked for weakness, groin pain, vertigo and wants his depression meds adjusted. Pt only c/o left groin pain x 1 year. Pain is intermittent with movement. Pt states had an episode of vertigo while in the car yesterday with daughter - took his antivert last night and felt better. No dizziness now.

## 2017-01-01 ENCOUNTER — Non-Acute Institutional Stay (SKILLED_NURSING_FACILITY): Payer: Medicare Other | Admitting: Gerontology

## 2017-01-01 ENCOUNTER — Encounter: Payer: Self-pay | Admitting: Gerontology

## 2017-01-01 DIAGNOSIS — R531 Weakness: Secondary | ICD-10-CM

## 2017-01-01 DIAGNOSIS — M5416 Radiculopathy, lumbar region: Secondary | ICD-10-CM | POA: Insufficient documentation

## 2017-01-01 NOTE — Progress Notes (Signed)
Location:   [facility] Room Number: [place] of Service:  SNF [phone])  Provider: Toni Arthurs, NP-C  PCP: Marden Noble, MD Patient Care Team: Marden Noble, MD as PCP - General (Internal Medicine)  Extended Emergency Contact Information Primary Emergency Contact: Gretchen Short [place] Phone: [phone] Relation: Daughter Secondary Emergency Contact: Vaux,Chris  [place] Phone: [phone] Relation: Son  Code Status: FULL Goals of care:  Advanced Directive information Advanced Directives 01/01/2017  Does Patient Have a Medical Advance Directive? No  Type of Advance Directive -  Does patient want to make changes to medical advance directive? -  Copy of [place] in Chart? -  Would patient like information on creating a medical advance directive? -     Allergies  Allergen Reactions  . Niacin Itching and Rash    Other reaction(s): Unknown Other reaction(s): UNKNOWN  . Niacin And Related Rash    Chief Complaint  Patient presents with  . Discharge Note    Discharged from SNF    HPI:  83 y.o. male seen today for discharge evaluation. Pt was admitted to the facility for rehab following hospitalization for dizziness and weakness with deconditioning. While here, pt began to c/o left groin pain. Pt c/o pain that starts in the lower back, through the groin and down the left leg, including the great toe. Pt denies numbness or tingling of the leg/foot. Pt denies fall/ trauma. Describes the pain as burning, ache. He does report a h/o back pain. He has been working with PT and OT for increased strengthening, increased mobility and generalized weakness.  No rashes or skin irritation. Pt was started on Gabapentin last week for the pain. Pt reports his pain is better/ improved. Encouraged pt to f/u with PCP about the pain for medication management. Pt verbalizes understanding. He feels he  is ready for discharge home. VSS. No other complaints.     Past Medical History:  Diagnosis Date  . Acquired cyst of kidney   . Allergy   . Anxiety   . Arthritis   . Atherosclerosis   . BPH (benign prostatic hyperplasia)   . CAD (coronary artery disease)   . Cardiac arrhythmia   . Chronic prostatitis   . Degenerative arthritis   . Diabetes mellitus without complication ([place])   . Elevated prostate specific antigen (PSA)   . Encysted hydrocele   . GERD (gastroesophageal reflux disease)   . Gout   . Gross hematuria   . HLD (hyperlipidemia)   . Hyperlipidemia, unspecified   . Hypertension   . Hypertrophy of breast   . Impotence of organic origin   . Incomplete bladder emptying   . Malignant neoplasm of prostate ([place])   . Other specified disorders of the male genital organs   . Prostate cancer ([place])   . PVD (peripheral vascular disease) ([place])   . Sciatica   . Spermatocele   . Spinal stenosis of lumbar region with radiculopathy 11/11/2015  . Stroke ([place])   . Testicular hypofunction    other  . Urinary frequency   . Urinary obstruction    not elsewhere classified  . Vertigo     Past Surgical History:  Procedure Laterality Date  . APPENDECTOMY    . CARDIAC CATHETERIZATION  11/2013  . FIDUCIAL SEED PLACEMENT  10/14/2009  . PROSTATE BIOPSY  08/11/2009  . RADIATION FOR PROSTATE CANCER        reports  that he quit smoking about 9 years ago. His smoking use included Cigarettes. He has a 50.00 pack-year smoking history. He has never used smokeless tobacco. He reports that he does not drink alcohol or use drugs. Social History   Social History  . Marital status: Married    Spouse name: N/A  . Number of children: N/A  . Years of education: N/A   Occupational History  . Not on file.   Social History Main Topics  . Smoking status: Former Smoker    Packs/day: 1.00    Years: 50.00    Types: Cigarettes    Quit date: 11/19/2007  . Smokeless tobacco: Never Used  .  Alcohol use No  . Drug use: No  . Sexual activity: Not on file   Other Topics Concern  . Not on file   Social History Narrative  . No narrative on file   Functional Status Survey:    Allergies  Allergen Reactions  . Niacin Itching and Rash    Other reaction(s): Unknown Other reaction(s): UNKNOWN  . Niacin And Related Rash    Pertinent  Health Maintenance Due  Topic Date Due  . FOOT EXAM  08/25/1942  . OPHTHALMOLOGY EXAM  08/25/1942  . PNA vac Low Risk Adult (1 of 2 - PCV13) 08/26/1999  . HEMOGLOBIN A1C  10/14/2016  . INFLUENZA VACCINE  01/16/2017    Medications: Allergies as of 01/01/2017      Reactions   Niacin Itching, Rash   Other reaction(s): Unknown Other reaction(s): UNKNOWN   Niacin And Related Rash      Medication List       Accurate as of 01/01/17  1:24 PM. Always use your most recent med list.          acetaminophen 325 MG tablet Commonly known as:  TYLENOL Take 325-650 mg by mouth every 4 (four) hours as needed for fever or headache.   amLODipine 5 MG tablet Commonly known as:  NORVASC Take 5 mg by mouth daily.   aspirin 81 MG EC tablet Take 1 tablet (81 mg total) by mouth daily.   benazepril 20 MG tablet Commonly known as:  LOTENSIN Take 20 mg by mouth daily.   clopidogrel 75 MG tablet Commonly known as:  PLAVIX Take 75 mg by mouth daily.   cyclobenzaprine 10 MG tablet Commonly known as:  FLEXERIL Take 1 tablet (10 mg total) by mouth 3 (three) times daily as needed for muscle spasms.   docusate sodium 100 MG capsule Commonly known as:  COLACE Take 100 mg by mouth 2 (two) times daily as needed. As needed.   donepezil 10 MG tablet Commonly known as:  ARICEPT Take 10 mg by mouth at bedtime.   escitalopram 10 MG tablet Commonly known as:  LEXAPRO Take 10 mg by mouth daily.   fluticasone 50 MCG/ACT nasal spray Commonly known as:  FLONASE Place 2 sprays into both nostrils daily.   gabapentin 100 MG capsule Commonly known as:   NEURONTIN Take 100 mg by mouth 3 (three) times daily.   glipiZIDE 2.5 MG 24 hr tablet Commonly known as:  GLUCOTROL XL Take 1 tablet (2.5 mg total) by mouth daily with breakfast.   latanoprost 0.005 % ophthalmic solution Commonly known as:  XALATAN Place 1 drop into the right eye at bedtime.   LORazepam 0.5 MG tablet Commonly known as:  ATIVAN Take 1 tablet (0.5 mg total) by mouth every 8 (eight) hours.   meclizine 12.5 MG tablet Commonly known  as:  ANTIVERT Take 1 tablet (12.5 mg total) by mouth 3 (three) times daily as needed for dizziness or nausea.   montelukast 10 MG tablet Commonly known as:  SINGULAIR Take 10 mg by mouth daily.   nitroGLYCERIN 0.4 MG SL tablet Commonly known as:  NITROSTAT Place 0.4 mg under the tongue every 5 (five) minutes as needed. For chest pain. Give up to 3 doses every 5 minutes as needed   OSTEO BI-FLEX TRIPLE STRENGTH Tabs Take 2 tablets by mouth daily.   pantoprazole 40 MG tablet Commonly known as:  PROTONIX Take 40 mg by mouth daily.   simethicone 80 MG chewable tablet Commonly known as:  MYLICON Chew 80 mg by mouth every 6 (six) hours as needed. As needed.   simvastatin 20 MG tablet Commonly known as:  ZOCOR Take 20 mg by mouth daily.   traMADol 50 MG tablet Commonly known as:  ULTRAM Take 50 mg by mouth every 6 (six) hours as needed.       Review of Systems  Constitutional: Negative for activity change, appetite change, chills, diaphoresis and fever.  HENT: Negative for congestion, sneezing, sore throat, trouble swallowing and voice change.   Respiratory: Negative for apnea, cough, choking, chest tightness, shortness of breath and wheezing.   Cardiovascular: Negative for chest pain, palpitations and leg swelling.  Gastrointestinal: Negative for abdominal distention, abdominal pain, constipation, diarrhea and nausea.  Genitourinary: Negative for difficulty urinating, dysuria, frequency and urgency.  Musculoskeletal: Positive  for arthralgias (typical arthritis), back pain and myalgias. Negative for gait problem.  Skin: Negative for color change, pallor, rash and wound.  Neurological: Negative for dizziness, tremors, syncope, speech difficulty, weakness, numbness and headaches.  Psychiatric/Behavioral: Negative for agitation and behavioral problems.  All other systems reviewed and are negative.   Vitals:   01/01/17 1254  BP: (!) 141/77  Pulse: 67  Resp: 18  Temp: 98.2 F (36.8 C)  SpO2: 100%  Weight: 222 lb (100.7 kg)  Height: 5\' 11"  (1.803 m)   Body mass index is 30.96 kg/m. Physical Exam  Constitutional: He is oriented to person, place, and time. Vital signs are normal. He appears well-developed and well-nourished. He is active and cooperative. He does not appear ill. No distress.  HENT:  Head: Normocephalic and atraumatic.  Mouth/Throat: Uvula is midline, oropharynx is clear and moist and mucous membranes are normal. Mucous membranes are not pale, not dry and not cyanotic.  Eyes: Pupils are equal, round, and reactive to light. Conjunctivae, EOM and lids are normal.  Neck: Trachea normal, normal range of motion and full passive range of motion without pain. Neck supple. No JVD present. No tracheal deviation, no edema and no erythema present. No thyromegaly present.  Cardiovascular: Normal rate, regular rhythm, intact distal pulses and normal pulses.  Exam reveals no gallop, no distant heart sounds and no friction rub.   Murmur heard. Pulses:      Dorsalis pedis pulses are 2+ on the right side, and 2+ on the left side.  Pulmonary/Chest: Effort normal and breath sounds normal. No accessory muscle usage. No respiratory distress. He has no decreased breath sounds. He has no wheezes. He has no rhonchi. He has no rales. He exhibits no tenderness.  Abdominal: Normal appearance and bowel sounds are normal. He exhibits no distension and no ascites. There is no tenderness.  Musculoskeletal: Normal range of motion.  He exhibits no edema.       Left hip: He exhibits tenderness.  Lumbar back: He exhibits pain and spasm.  Expected osteoarthritis, stiffness; calves soft, supple. Negative Homan's sign  Neurological: He is alert and oriented to person, place, and time. He has normal strength.  Skin: Skin is warm, dry and intact. No rash noted. He is not diaphoretic. No cyanosis or erythema. No pallor. Nails show no clubbing.  Psychiatric: He has a normal mood and affect. His speech is normal and behavior is normal. Judgment and thought content normal. Cognition and memory are normal.  Nursing note and vitals reviewed.   Labs reviewed: Basic Metabolic Panel:  Recent Labs  12/14/16 0424 12/15/16 0502 12/30/16 1223  NA 139 138 138  K 3.4* 3.9 4.5  CL 106 104 104  CO2 28 28 29   GLUCOSE 131* 135* 97  BUN 13 16 20   CREATININE 0.86 1.00 1.29*  CALCIUM 8.7* 8.9 9.0   Liver Function Tests:  Recent Labs  04/15/16 0007 12/30/16 1223  AST 21 17  ALT 17 18  ALKPHOS 68 83  BILITOT 0.6 0.8  PROT 7.5 7.1  ALBUMIN 4.0 4.0   No results for input(s): LIPASE, AMYLASE in the last 8760 hours. No results for input(s): AMMONIA in the last 8760 hours. CBC:  Recent Labs  12/14/16 0424 12/15/16 0502 12/30/16 1223  WBC 6.3 6.5 6.7  NEUTROABS  --   --  3.8  HGB 16.1 16.7 16.7  HCT 46.9 47.9 48.5  MCV 93.1 93.6 95.2  PLT 179 178 173   Cardiac Enzymes:  Recent Labs  06/16/16 1550 07/27/16 1623 12/13/16 0628  TROPONINI <0.03 <0.03 <0.03   BNP: Invalid input(s): POCBNP CBG:  Recent Labs  12/16/16 2100 12/17/16 0732 12/17/16 1144  GLUCAP 196* 134* 110*    Procedures and Imaging Studies During Stay: Dg Chest 1 View  Result Date: 12/13/2016 CLINICAL DATA:  Underlies weakness for past week with a fall yesterday. The patient reports abdominal bloating. History of coronary artery disease, diabetes, CVA, discontinued smoking 10 years ago. EXAM: CHEST 1 VIEW COMPARISON:  Chest x-ray of  July 27, 2016 FINDINGS: The lungs are adequately inflated and clear. The heart is top-normal in size. The pulmonary vascularity is normal. The mediastinum is normal in width. The trachea is midline. The bony thorax exhibits no acute abnormality. IMPRESSION: There is no active cardiopulmonary disease. Electronically Signed   By: David  Martinique M.D.   On: 12/13/2016 07:05   Ct Head Wo Contrast  Result Date: 12/13/2016 CLINICAL DATA:  Generalized weakness over the last week. Fell yesterday. EXAM: CT HEAD WITHOUT CONTRAST TECHNIQUE: Contiguous axial images were obtained from the base of the skull through the vertex without intravenous contrast. COMPARISON:  06/16/2016 FINDINGS: Brain: No change. Mild age related atrophy, less than often seen at this age. Mild small vessel change of the white matter, less than often seen at this age. Punctate parenchymal calcification of the left temporal lobe, unchanged. This could be a small benign parenchymal or vascular calcification, not likely significant. No sign of acute infarction, mass lesion, hemorrhage, hydrocephalus or extra-axial collection. Vascular: There is atherosclerotic calcification of the major vessels at the base of the brain. Skull: Normal Sinuses/Orbits: Clear/normal Other: None significant IMPRESSION: No acute or traumatic finding. Mild age related atrophy and small-vessel change of the white matter, less than often seen in individuals of this age. Electronically Signed   By: Nelson Chimes M.D.   On: 12/13/2016 07:30   Mr Brain Wo Contrast  Result Date: 12/13/2016 CLINICAL DATA:  Dizziness.  Fall yesterday.  EXAM: MRI HEAD WITHOUT CONTRAST TECHNIQUE: Multiplanar, multiecho pulse sequences of the brain and surrounding structures were obtained without intravenous contrast. COMPARISON:  CT head 12/13/2016 FINDINGS: Brain: Ventricle size normal. Cerebral volume normal for age. Negative for acute infarct. Patchy hyperintensity in the cerebral white matter  bilaterally. Small chronic infarcts in the thalamus bilaterally. Chronic lacunar infarction right internal capsule. Negative for hemorrhage mass or edema. No shift of the midline structures. Vascular: Normal arterial flow voids Skull and upper cervical spine: Negative Sinuses/Orbits: Negative Other: None IMPRESSION: No acute intracranial abnormality. Chronic microvascular ischemic changes in the white matter and basal ganglia. Electronically Signed   By: Franchot Gallo M.D.   On: 12/13/2016 10:25   Dg Abd 2 Views  Result Date: 12/13/2016 CLINICAL DATA:  Abdominal distention and bloating for 6 months, some nausea recently, history of prostate carcinoma EXAM: ABDOMEN - 2 VIEW COMPARISON:  CT abdomen and pelvis of 06/02/2013 FINDINGS: Supine and sitting views of the abdomen were obtained. No bowel obstruction is seen. No free air is noted. No opaque calculi are seen. There are degenerative changes in the mid to lower lumbar spine. IMPRESSION: 1. No bowel obstruction.  No free air. 2. No opaque calculi are seen. Electronically Signed   By: Ivar Drape M.D.   On: 12/13/2016 08:35   Dg Hip Unilat W Or Wo Pelvis 2-3 Views Left  Result Date: 12/30/2016 CLINICAL DATA:  Left groin pain for 1 year. Status post fall 2 weeks ago. Initial encounter. EXAM: DG HIP (WITH OR WITHOUT PELVIS) 2-3V LEFT COMPARISON:  None. FINDINGS: There is no acute bony or joint abnormality. The patient has moderate to moderately severe left hip osteoarthritis with joint space narrowing, subchondral cyst formation and osteophytosis present. The right hip is unremarkable. No avascular necrosis of the femoral heads. Atherosclerotic vascular disease is noted. IMPRESSION: No acute abnormality. Moderate to moderately severe left hip osteoarthritis. Electronically Signed   By: Inge Rise M.D.   On: 12/30/2016 13:26    Assessment/Plan:   1. Lumbar radiculopathy, acute  Gabapentin 100 mg po TID  Stretching exercises  Ice to back/ hip  prn  Tramadol 50 mg po Q 6 hours prn  2. Generalized Weakness  PT/OT for strengthening  Ambulate with assistance  Fall precautions  Follow up with PCP asap for continuity of care   Patient is being discharged with the following home health services: HHPT/OT/RN through [facility]  Patient is being discharged with the following durable medical equipment: W/C   Patient has been advised to f/u with their PCP in 1-2 weeks to bring them up to date on their rehab stay.  Social services at facility was responsible for arranging this appointment.  Pt was provided with a 30 day supply of prescriptions for medications and refills must be obtained from their PCP.  For controlled substances, a more limited supply may be provided adequate until PCP appointment only.  Patient needs a standard wheelchair.  Patient is able to propel self and maintain current level of independence using a wheelchair.  Patient is unable to perform these same daily functions with a walker, cane or other alternative assistive device.     Future labs/tests needed:    Family/ staff Communication:   Total Time:  Documentation:  Face to Face:  Family/Phone:  Vikki Ports, NP-C [facility] [address] [place], [place] [address] Cell Phone (Mon-Fri 8am-5pm):  [phone] On Call:  [phone] & follow prompts  after 5pm & weekends Office Phone:  [phone] Office Fax:  [phone]

## 2017-03-07 ENCOUNTER — Ambulatory Visit (INDEPENDENT_AMBULATORY_CARE_PROVIDER_SITE_OTHER): Payer: Medicare Other | Admitting: Podiatry

## 2017-03-07 ENCOUNTER — Encounter: Payer: Self-pay | Admitting: Podiatry

## 2017-03-07 DIAGNOSIS — B351 Tinea unguium: Secondary | ICD-10-CM | POA: Diagnosis not present

## 2017-03-07 DIAGNOSIS — M79676 Pain in unspecified toe(s): Secondary | ICD-10-CM | POA: Diagnosis not present

## 2017-03-07 DIAGNOSIS — M2010 Hallux valgus (acquired), unspecified foot: Secondary | ICD-10-CM

## 2017-03-07 DIAGNOSIS — E1142 Type 2 diabetes mellitus with diabetic polyneuropathy: Secondary | ICD-10-CM

## 2017-03-07 NOTE — Progress Notes (Addendum)
Complaint:  Visit Type: Patient returns to my office for continued preventative foot care services. Complaint: Patient states" my nails have grown long and thick and become painful to walk and wear shoes" Patient has been diagnosed with DM with no foot complications. The patient presents for preventative foot care services. No changes to ROS.    Podiatric Exam: Vascular: dorsalis pedis and posterior tibial pulses are palpable bilateral. Capillary return is immediate. Temperature gradient is WNL. Skin turgor WNL  Sensorium: Normal Semmes Weinstein monofilament test. Normal tactile sensation bilaterally. Nail Exam: Pt has thick disfigured discolored nails with subungual debris noted bilateral entire nail hallux through fifth toenails Ulcer Exam: There is no evidence of ulcer or pre-ulcerative changes or infection. Orthopedic Exam: Muscle tone and strength are WNL. No limitations in general ROM. No crepitus or effusions noted. Foot type and digits show no abnormalities  HAV  B/L. Skin: No Porokeratosis. No infection or ulcers  Diagnosis:  Onychomycosis, , Pain in right toe, pain in left toes,  Diabetes with neuropathy   HAV  B/L  Treatment & Plan Procedures and Treatment: Consent by patient was obtained for treatment procedures. The patient understood the discussion of treatment and procedures well. All questions were answered thoroughly reviewed. Debridement of mycotic and hypertrophic toenails, 1 through 5 bilateral and clearing of subungual debris. No ulceration, no infection noted.  Return Visit-Office Procedure: Patient instructed to return to the office for a follow up visit 4 months for continued evaluation and treatment.    Gardiner Barefoot DPM

## 2017-04-24 ENCOUNTER — Encounter
Admission: RE | Admit: 2017-04-24 | Discharge: 2017-04-24 | Disposition: A | Discharge status: Home / Self Care | Payer: Medicare Other | Source: Ambulatory Visit | Attending: Orthopedic Surgery | Admitting: Orthopedic Surgery

## 2017-04-24 ENCOUNTER — Other Ambulatory Visit: Payer: Self-pay

## 2017-04-24 DIAGNOSIS — Z01812 Encounter for preprocedural laboratory examination: Secondary | ICD-10-CM | POA: Diagnosis present

## 2017-04-24 HISTORY — DX: Unspecified dementia, unspecified severity, without behavioral disturbance, psychotic disturbance, mood disturbance, and anxiety: F03.90

## 2017-04-24 HISTORY — DX: Major depressive disorder, single episode, unspecified: F32.9

## 2017-04-24 HISTORY — DX: Depression, unspecified: F32.A

## 2017-04-24 HISTORY — DX: Unspecified glaucoma: H40.9

## 2017-04-24 HISTORY — DX: Dyspnea, unspecified: R06.00

## 2017-04-24 LAB — PROTIME-INR
INR: 0.99
Prothrombin Time: 13 seconds (ref 11.4–15.2)

## 2017-04-24 LAB — CBC WITH DIFFERENTIAL/PLATELET
BASOS PCT: 0 %
Basophils Absolute: 0 10*3/uL (ref 0–0.1)
EOS ABS: 0.2 10*3/uL (ref 0–0.7)
Eosinophils Relative: 3 %
HCT: 51.3 % (ref 40.0–52.0)
Hemoglobin: 16.8 g/dL (ref 13.0–18.0)
Lymphocytes Relative: 24 %
Lymphs Abs: 1.5 10*3/uL (ref 1.0–3.6)
MCH: 31.2 pg (ref 26.0–34.0)
MCHC: 32.7 g/dL (ref 32.0–36.0)
MCV: 95.6 fL (ref 80.0–100.0)
MONO ABS: 0.6 10*3/uL (ref 0.2–1.0)
MONOS PCT: 10 %
Neutro Abs: 3.8 10*3/uL (ref 1.4–6.5)
Neutrophils Relative %: 63 %
Platelets: 181 10*3/uL (ref 150–440)
RBC: 5.37 MIL/uL (ref 4.40–5.90)
RDW: 14.3 % (ref 11.5–14.5)
WBC: 6.1 10*3/uL (ref 3.8–10.6)

## 2017-04-24 LAB — COMPREHENSIVE METABOLIC PANEL
ALBUMIN: 4.2 g/dL (ref 3.5–5.0)
ALT: 16 U/L — ABNORMAL LOW (ref 17–63)
ANION GAP: 9 (ref 5–15)
AST: 20 U/L (ref 15–41)
Alkaline Phosphatase: 84 U/L (ref 38–126)
BILIRUBIN TOTAL: 1 mg/dL (ref 0.3–1.2)
BUN: 14 mg/dL (ref 6–20)
CO2: 26 mmol/L (ref 22–32)
Calcium: 9 mg/dL (ref 8.9–10.3)
Chloride: 104 mmol/L (ref 101–111)
Creatinine, Ser: 0.95 mg/dL (ref 0.61–1.24)
GFR calc Af Amer: >60 mL/min (ref >60)
GFR calc non Af Amer: >60 mL/min (ref >60)
GLUCOSE: 110 mg/dL — AB (ref 65–99)
POTASSIUM: 3.5 mmol/L (ref 3.5–5.1)
SODIUM: 139 mmol/L (ref 135–145)
TOTAL PROTEIN: 7.7 g/dL (ref 6.5–8.1)

## 2017-04-24 LAB — SURGICAL PCR SCREEN
MRSA, PCR: NEGATIVE
STAPHYLOCOCCUS AUREUS: NEGATIVE

## 2017-04-24 LAB — URINALYSIS, ROUTINE W REFLEX MICROSCOPIC
Bacteria, UA: NONE SEEN
Bilirubin Urine: NEGATIVE
GLUCOSE, UA: NEGATIVE mg/dL
Hgb urine dipstick: NEGATIVE
Ketones, ur: NEGATIVE mg/dL
Leukocytes, UA: NEGATIVE
Nitrite: NEGATIVE
PROTEIN: 30 mg/dL — AB
SQUAMOUS EPITHELIAL / LPF: NONE SEEN
Specific Gravity, Urine: 1.02 (ref 1.005–1.030)
pH: 5 (ref 5.0–8.0)

## 2017-04-24 LAB — SEDIMENTATION RATE: Sed Rate: 2 mm/hr (ref 0–20)

## 2017-04-24 LAB — TYPE AND SCREEN
ABO/RH(D): O POS
ANTIBODY SCREEN: NEGATIVE

## 2017-04-24 LAB — HEMOGLOBIN A1C
HEMOGLOBIN A1C: 5.8 % — AB (ref 4.8–5.6)
MEAN PLASMA GLUCOSE: 119.76 mg/dL

## 2017-04-24 LAB — APTT: aPTT: 30 seconds (ref 24–36)

## 2017-04-24 LAB — C-REACTIVE PROTEIN: CRP: <0.8 mg/dL (ref <1.0)

## 2017-04-24 NOTE — Patient Instructions (Signed)
Your procedure is scheduled on May 06, 2017 [facility] ) Report to Same Day Surgery. ([facility]ICAL [facility] ) SECOND FLOOR To find out your arrival time please call [phone] between 1PM - 3PM on May 03, 2017 (FRIDAY ).  Remember: Instructions that are not followed completely may result in serious medical risk, up to and including death, or upon the discretion of your surgeon and anesthesiologist your surgery may need to be rescheduled.     _X__ 1. Do not eat food after midnight the night before your procedure.                 No gum chewing or hard candies. You may drink clear liquids up to 2 hours                 before you are scheduled to arrive for your surgery- DO not drink clear                 liquids within 2 hours of the start of your surgery.                 Clear Liquids include:  water, apple juice without pulp, clear carbohydrate                 drink such as Clearfast of Gartorade, Black Coffee or Tea (Do not add                 anything to coffee or tea).     _X__ 2.  No Alcohol for 24 hours before or after surgery.   _X__ 3.  Do Not Smoke or use e-cigarettes For 24 Hours Prior to Your Surgery.                 Do not use any chewable tobacco products for at least 6 hours prior to                 surgery.  ____  4.  Bring all medications with you on the day of surgery if instructed.   __X__  5.  Notify your doctor if there is any change in your medical condition      (cold, fever, infections).     Do not wear jewelry, make-up, hairpins, clips or nail polish. Do not wear lotions, powders, or perfumes. Do not shave 48 hours prior to surgery. Men may shave face and neck. Do not bring valuables to the hospital.    [facility] is not responsible for any belongings or valuables.  Contacts, dentures or bridgework may not be worn into surgery. Leave your suitcase in the car. After surgery it may be brought to your room. For patients admitted to the  hospital, discharge time is determined by your treatment team.   Patients discharged the day of surgery will not be allowed to drive home.   Please read over the following fact sheets that you were given:   MRSA Information          __X_ Take these medicines the morning of surgery with A SIP OF WATER:    1. MONTELUKAST  2. PANTOPRAZOLE  3. PANTOPRAZOLE  AT BEDTIME ON SUNDAY NIGHT, NOVEMBER 18  4. AMLODIPINE  5. SIMVASTATIN  6.  ____ Fleet Enema (as directed)   __X__ Use CHG Soap as directed  ____ Use inhalers on the day of surgery  ____ Stop metformin 2 days prior to surgery    ____ Take 1/2 of usual insulin dose the night  before surgery. No insulin the morning          of surgery.   __X__ Stop Coumadin/Plavix/aspirin on (STOP ASPIRIN ONE WEEK BEFORE SURGERY ) DR Marry Guan OFFICE TO NOTIFY PATIENT WHEN TO STOP PLAVIX  _X___ Stop Anti-inflammatories on (STOP ALL ASPIRIN PRODUCTS ONE WEEK BEFORE SURGERY, NO MOTRIN, IBUPROFEN, ADVIL ALEVE, GOODY'S, EXCEDRIN BC POWDER ) TYLENOL OK TO TAKE FOR PAIN IF NEEDED   _X___ Stop supplements until after surgery.  (STOP OSTEO BI-FLEX NOW )  ____ Bring C-Pap to the hospital.

## 2017-04-26 LAB — URINE CULTURE: Culture: 70000 — AB

## 2017-05-05 MED ORDER — CEFAZOLIN SODIUM-DEXTROSE 2-4 GM/100ML-% IV SOLN
2.0000 g | INTRAVENOUS | Status: AC
  Administered 2017-05-06: 2 g via INTRAVENOUS

## 2017-05-05 MED ORDER — TRANEXAMIC ACID 1000 MG/10ML IV SOLN
1000.0000 mg | INTRAVENOUS | Status: AC
  Administered 2017-05-06: 1000 mg via INTRAVENOUS
  Filled 2017-05-05: qty 10

## 2017-05-06 ENCOUNTER — Inpatient Hospital Stay: Payer: Medicare Other | Admitting: Certified Registered Nurse Anesthetist

## 2017-05-06 ENCOUNTER — Inpatient Hospital Stay: Payer: Medicare Other

## 2017-05-06 ENCOUNTER — Other Ambulatory Visit: Payer: Self-pay

## 2017-05-06 ENCOUNTER — Encounter: Payer: Self-pay | Admitting: Orthopedic Surgery

## 2017-05-06 ENCOUNTER — Inpatient Hospital Stay
Admission: RE | Admit: 2017-05-06 | Discharge: 2017-05-08 | DRG: 470 | Disposition: A | Discharge status: Skilled Nursing Facility | Payer: Medicare Other | Source: Ambulatory Visit | Attending: Orthopedic Surgery | Admitting: Orthopedic Surgery

## 2017-05-06 ENCOUNTER — Encounter
Admission: RE | Disposition: A | Discharge status: Skilled Nursing Facility | Payer: Self-pay | Source: Ambulatory Visit | Attending: Orthopedic Surgery

## 2017-05-06 DIAGNOSIS — M1612 Unilateral primary osteoarthritis, left hip: Secondary | ICD-10-CM | POA: Diagnosis present

## 2017-05-06 DIAGNOSIS — F329 Major depressive disorder, single episode, unspecified: Secondary | ICD-10-CM | POA: Diagnosis present

## 2017-05-06 DIAGNOSIS — E669 Obesity, unspecified: Secondary | ICD-10-CM | POA: Diagnosis present

## 2017-05-06 DIAGNOSIS — I10 Essential (primary) hypertension: Secondary | ICD-10-CM | POA: Diagnosis present

## 2017-05-06 DIAGNOSIS — Z79899 Other long term (current) drug therapy: Secondary | ICD-10-CM

## 2017-05-06 DIAGNOSIS — Z96649 Presence of unspecified artificial hip joint: Secondary | ICD-10-CM

## 2017-05-06 DIAGNOSIS — Z8546 Personal history of malignant neoplasm of prostate: Secondary | ICD-10-CM | POA: Diagnosis not present

## 2017-05-06 DIAGNOSIS — N281 Cyst of kidney, acquired: Secondary | ICD-10-CM | POA: Diagnosis present

## 2017-05-06 DIAGNOSIS — Z7902 Long term (current) use of antithrombotics/antiplatelets: Secondary | ICD-10-CM

## 2017-05-06 DIAGNOSIS — Z8673 Personal history of transient ischemic attack (TIA), and cerebral infarction without residual deficits: Secondary | ICD-10-CM | POA: Diagnosis not present

## 2017-05-06 DIAGNOSIS — E785 Hyperlipidemia, unspecified: Secondary | ICD-10-CM | POA: Diagnosis present

## 2017-05-06 DIAGNOSIS — M48061 Spinal stenosis, lumbar region without neurogenic claudication: Secondary | ICD-10-CM | POA: Diagnosis present

## 2017-05-06 DIAGNOSIS — Z6832 Body mass index (BMI) 32.0-32.9, adult: Secondary | ICD-10-CM

## 2017-05-06 DIAGNOSIS — K219 Gastro-esophageal reflux disease without esophagitis: Secondary | ICD-10-CM | POA: Diagnosis present

## 2017-05-06 DIAGNOSIS — Z888 Allergy status to other drugs, medicaments and biological substances status: Secondary | ICD-10-CM

## 2017-05-06 DIAGNOSIS — I2510 Atherosclerotic heart disease of native coronary artery without angina pectoris: Secondary | ICD-10-CM | POA: Diagnosis present

## 2017-05-06 DIAGNOSIS — E119 Type 2 diabetes mellitus without complications: Secondary | ICD-10-CM | POA: Diagnosis present

## 2017-05-06 DIAGNOSIS — Z8249 Family history of ischemic heart disease and other diseases of the circulatory system: Secondary | ICD-10-CM

## 2017-05-06 DIAGNOSIS — Z7984 Long term (current) use of oral hypoglycemic drugs: Secondary | ICD-10-CM | POA: Diagnosis not present

## 2017-05-06 DIAGNOSIS — Z7982 Long term (current) use of aspirin: Secondary | ICD-10-CM

## 2017-05-06 DIAGNOSIS — M25552 Pain in left hip: Secondary | ICD-10-CM | POA: Diagnosis present

## 2017-05-06 HISTORY — PX: TOTAL HIP ARTHROPLASTY: SHX124

## 2017-05-06 LAB — GLUCOSE, CAPILLARY
GLUCOSE-CAPILLARY: 125 mg/dL — AB (ref 65–99)
GLUCOSE-CAPILLARY: 133 mg/dL — AB (ref 65–99)
GLUCOSE-CAPILLARY: 158 mg/dL — AB (ref 65–99)
GLUCOSE-CAPILLARY: 156 mg/dL — AB (ref 65–99)
Glucose-Capillary: 138 mg/dL — ABNORMAL HIGH (ref 65–99)

## 2017-05-06 LAB — ABO/RH: ABO/RH(D): O POS

## 2017-05-06 SURGERY — ARTHROPLASTY, HIP, TOTAL,POSTERIOR APPROACH
Anesthesia: Spinal | Site: Hip | Laterality: Left | Wound class: Clean

## 2017-05-06 MED ORDER — SODIUM CHLORIDE 0.9 % IV SOLN
INTRAVENOUS | Status: DC | PRN
  Administered 2017-05-06: 50 ug/min via INTRAVENOUS

## 2017-05-06 MED ORDER — PROPOFOL 500 MG/50ML IV EMUL
INTRAVENOUS | Status: AC
  Filled 2017-05-06: qty 50

## 2017-05-06 MED ORDER — LABETALOL HCL 5 MG/ML IV SOLN
INTRAVENOUS | Status: DC | PRN
  Administered 2017-05-06: 5 mg via INTRAVENOUS

## 2017-05-06 MED ORDER — FLEET ENEMA 7-19 GM/118ML RE ENEM: 1.0000 | ENEMA | Freq: Once | RECTAL | Status: DC | PRN

## 2017-05-06 MED ORDER — NITROGLYCERIN 0.4 MG SL SUBL: 0.4000 mg | SUBLINGUAL_TABLET | SUBLINGUAL | Status: DC | PRN

## 2017-05-06 MED ORDER — ASPIRIN EC 81 MG PO TBEC
81.0000 mg | DELAYED_RELEASE_TABLET | Freq: Every day | ORAL | Status: DC
  Administered 2017-05-06 – 2017-05-08 (×3): 81 mg via ORAL
  Filled 2017-05-06 (×3): qty 1

## 2017-05-06 MED ORDER — ONDANSETRON HCL 4 MG PO TABS: 4.0000 mg | ORAL_TABLET | Freq: Four times a day (QID) | ORAL | Status: DC | PRN

## 2017-05-06 MED ORDER — DEXTROSE 5 % IV SOLN
2.0000 g | Freq: Four times a day (QID) | INTRAVENOUS | Status: AC
  Administered 2017-05-06 – 2017-05-07 (×4): 2 g via INTRAVENOUS
  Filled 2017-05-06 (×4): qty 20

## 2017-05-06 MED ORDER — SODIUM CHLORIDE 0.9 % IV SOLN
INTRAVENOUS | Status: DC
  Administered 2017-05-06 – 2017-05-07 (×2): via INTRAVENOUS

## 2017-05-06 MED ORDER — LABETALOL HCL 5 MG/ML IV SOLN
INTRAVENOUS | Status: AC
  Filled 2017-05-06: qty 4

## 2017-05-06 MED ORDER — MIDAZOLAM HCL 2 MG/2ML IJ SOLN
INTRAMUSCULAR | Status: AC
  Filled 2017-05-06: qty 2

## 2017-05-06 MED ORDER — PHENYLEPHRINE HCL 10 MG/ML IJ SOLN
INTRAMUSCULAR | Status: DC | PRN
  Administered 2017-05-06: 100 ug via INTRAVENOUS
  Administered 2017-05-06: 200 ug via INTRAVENOUS

## 2017-05-06 MED ORDER — ACETAMINOPHEN 10 MG/ML IV SOLN
INTRAVENOUS | Status: AC
  Filled 2017-05-06: qty 100

## 2017-05-06 MED ORDER — TRAMADOL HCL 50 MG PO TABS
50.0000 mg | ORAL_TABLET | ORAL | Status: DC | PRN
  Administered 2017-05-06 – 2017-05-07 (×4): 100 mg via ORAL
  Filled 2017-05-06 (×4): qty 2

## 2017-05-06 MED ORDER — LATANOPROST 0.005 % OP SOLN
1.0000 [drp] | Freq: Every day | OPHTHALMIC | Status: DC
  Administered 2017-05-06 – 2017-05-07 (×2): 1 [drp] via OPHTHALMIC
  Filled 2017-05-06: qty 2.5

## 2017-05-06 MED ORDER — ROCURONIUM BROMIDE 50 MG/5ML IV SOLN
INTRAVENOUS | Status: AC
  Filled 2017-05-06: qty 1

## 2017-05-06 MED ORDER — INSULIN ASPART 100 UNIT/ML ~~LOC~~ SOLN
0.0000 [IU] | Freq: Three times a day (TID) | SUBCUTANEOUS | Status: DC
  Administered 2017-05-06: 3 [IU] via SUBCUTANEOUS
  Administered 2017-05-07 (×2): 2 [IU] via SUBCUTANEOUS
  Filled 2017-05-06 (×3): qty 1

## 2017-05-06 MED ORDER — EPHEDRINE SULFATE 50 MG/ML IJ SOLN
INTRAMUSCULAR | Status: AC
  Filled 2017-05-06: qty 1

## 2017-05-06 MED ORDER — PHENYLEPHRINE HCL 10 MG/ML IJ SOLN
INTRAMUSCULAR | Status: AC
  Filled 2017-05-06: qty 1

## 2017-05-06 MED ORDER — FLUTICASONE PROPIONATE 50 MCG/ACT NA SUSP
2.0000 | Freq: Every day | NASAL | Status: DC
  Filled 2017-05-06: qty 16

## 2017-05-06 MED ORDER — MEPERIDINE HCL 50 MG/ML IJ SOLN: 6.2500 mg | INTRAMUSCULAR | Status: DC | PRN

## 2017-05-06 MED ORDER — BENAZEPRIL HCL 20 MG PO TABS
20.0000 mg | ORAL_TABLET | Freq: Every day | ORAL | Status: DC
  Administered 2017-05-06 – 2017-05-07 (×2): 20 mg via ORAL
  Filled 2017-05-06 (×3): qty 1

## 2017-05-06 MED ORDER — PROPOFOL 10 MG/ML IV BOLUS
INTRAVENOUS | Status: DC | PRN
  Administered 2017-05-06: 20 mg via INTRAVENOUS
  Administered 2017-05-06: 10 mg via INTRAVENOUS
  Administered 2017-05-06: 30 mg via INTRAVENOUS
  Administered 2017-05-06: 20 mg via INTRAVENOUS

## 2017-05-06 MED ORDER — METOCLOPRAMIDE HCL 10 MG PO TABS
10.0000 mg | ORAL_TABLET | Freq: Three times a day (TID) | ORAL | Status: AC
  Administered 2017-05-06 – 2017-05-08 (×7): 10 mg via ORAL
  Filled 2017-05-06 (×7): qty 1

## 2017-05-06 MED ORDER — BUPIVACAINE HCL (PF) 0.5 % IJ SOLN
INTRAMUSCULAR | Status: AC
  Filled 2017-05-06: qty 10

## 2017-05-06 MED ORDER — TRANEXAMIC ACID 1000 MG/10ML IV SOLN
1000.0000 mg | Freq: Once | INTRAVENOUS | Status: AC
  Administered 2017-05-06: 1000 mg via INTRAVENOUS
  Filled 2017-05-06: qty 10

## 2017-05-06 MED ORDER — FENTANYL CITRATE (PF) 100 MCG/2ML IJ SOLN
INTRAMUSCULAR | Status: AC
  Filled 2017-05-06: qty 2

## 2017-05-06 MED ORDER — OXYCODONE HCL 5 MG PO TABS
10.0000 mg | ORAL_TABLET | ORAL | Status: DC | PRN
  Administered 2017-05-06 – 2017-05-08 (×7): 10 mg via ORAL
  Filled 2017-05-06 (×7): qty 2

## 2017-05-06 MED ORDER — SUCCINYLCHOLINE CHLORIDE 20 MG/ML IJ SOLN
INTRAMUSCULAR | Status: AC
  Filled 2017-05-06: qty 2

## 2017-05-06 MED ORDER — MENTHOL 3 MG MT LOZG
1.0000 | LOZENGE | OROMUCOSAL | Status: DC | PRN
  Filled 2017-05-06: qty 9

## 2017-05-06 MED ORDER — PROMETHAZINE HCL 25 MG/ML IJ SOLN
12.5000 mg | Freq: Once | INTRAMUSCULAR | Status: AC
  Administered 2017-05-06: 12.5 mg via INTRAVENOUS
  Filled 2017-05-06: qty 1

## 2017-05-06 MED ORDER — ALUM & MAG HYDROXIDE-SIMETH 200-200-20 MG/5ML PO SUSP: 30.0000 mL | ORAL | Status: DC | PRN

## 2017-05-06 MED ORDER — SIMVASTATIN 20 MG PO TABS
20.0000 mg | ORAL_TABLET | Freq: Every day | ORAL | Status: DC
  Administered 2017-05-07 – 2017-05-08 (×2): 20 mg via ORAL
  Filled 2017-05-06 (×2): qty 1

## 2017-05-06 MED ORDER — CHLORHEXIDINE GLUCONATE 4 % EX LIQD: 60.0000 mL | Freq: Once | CUTANEOUS | Status: DC

## 2017-05-06 MED ORDER — MONTELUKAST SODIUM 10 MG PO TABS
10.0000 mg | ORAL_TABLET | Freq: Every day | ORAL | Status: DC
  Administered 2017-05-07 – 2017-05-08 (×2): 10 mg via ORAL
  Filled 2017-05-06 (×2): qty 1

## 2017-05-06 MED ORDER — PROPOFOL 500 MG/50ML IV EMUL
INTRAVENOUS | Status: DC | PRN
  Administered 2017-05-06: 45 ug/kg/min via INTRAVENOUS

## 2017-05-06 MED ORDER — POLYETHYL GLYCOL-PROPYL GLYCOL 0.4-0.3 % OP SOLN
1.0000 [drp] | Freq: Two times a day (BID) | OPHTHALMIC | Status: DC
  Filled 2017-05-06 (×2): qty 15

## 2017-05-06 MED ORDER — PROMETHAZINE HCL 25 MG/ML IJ SOLN: 12.5000 mg | Freq: Four times a day (QID) | INTRAMUSCULAR | Status: DC | PRN

## 2017-05-06 MED ORDER — MAGNESIUM HYDROXIDE 400 MG/5ML PO SUSP
30.0000 mL | Freq: Every day | ORAL | Status: DC | PRN
  Administered 2017-05-07 – 2017-05-08 (×2): 30 mL via ORAL
  Filled 2017-05-06 (×2): qty 30

## 2017-05-06 MED ORDER — DONEPEZIL HCL 5 MG PO TABS
10.0000 mg | ORAL_TABLET | Freq: Every day | ORAL | Status: DC
  Administered 2017-05-06 – 2017-05-07 (×2): 10 mg via ORAL
  Filled 2017-05-06: qty 2
  Filled 2017-05-06: qty 1
  Filled 2017-05-06: qty 2

## 2017-05-06 MED ORDER — POLYVINYL ALCOHOL 1.4 % OP SOLN
1.0000 [drp] | Freq: Two times a day (BID) | OPHTHALMIC | Status: DC
  Administered 2017-05-06 – 2017-05-07 (×2): 1 [drp] via OPHTHALMIC
  Filled 2017-05-06: qty 15

## 2017-05-06 MED ORDER — PROMETHAZINE HCL 25 MG/ML IJ SOLN: 6.2500 mg | INTRAMUSCULAR | Status: DC | PRN

## 2017-05-06 MED ORDER — GLIPIZIDE ER 2.5 MG PO TB24
2.5000 mg | ORAL_TABLET | Freq: Every day | ORAL | Status: DC
  Administered 2017-05-07 – 2017-05-08 (×2): 2.5 mg via ORAL
  Filled 2017-05-06 (×2): qty 1

## 2017-05-06 MED ORDER — FENTANYL CITRATE (PF) 100 MCG/2ML IJ SOLN
INTRAMUSCULAR | Status: DC | PRN
  Administered 2017-05-06 (×2): 50 ug via INTRAVENOUS

## 2017-05-06 MED ORDER — TETRACAINE HCL 1 % IJ SOLN
INTRAMUSCULAR | Status: DC | PRN
  Administered 2017-05-06: 5 mg via INTRASPINAL

## 2017-05-06 MED ORDER — OXYCODONE HCL 5 MG/5ML PO SOLN: 5.0000 mg | Freq: Once | ORAL | Status: DC | PRN

## 2017-05-06 MED ORDER — CEFAZOLIN SODIUM-DEXTROSE 2-4 GM/100ML-% IV SOLN
INTRAVENOUS | Status: AC
  Filled 2017-05-06: qty 100

## 2017-05-06 MED ORDER — PANTOPRAZOLE SODIUM 40 MG PO TBEC
40.0000 mg | DELAYED_RELEASE_TABLET | Freq: Two times a day (BID) | ORAL | Status: DC
  Administered 2017-05-06 – 2017-05-08 (×4): 40 mg via ORAL
  Filled 2017-05-06 (×4): qty 1

## 2017-05-06 MED ORDER — NEOMYCIN-POLYMYXIN B GU 40-200000 IR SOLN
Status: DC | PRN
  Administered 2017-05-06: 16 mL

## 2017-05-06 MED ORDER — DIPHENHYDRAMINE HCL 12.5 MG/5ML PO ELIX: 12.5000 mg | ORAL_SOLUTION | ORAL | Status: DC | PRN

## 2017-05-06 MED ORDER — EPHEDRINE SULFATE 50 MG/ML IJ SOLN
INTRAMUSCULAR | Status: DC | PRN
  Administered 2017-05-06: 10 mg via INTRAVENOUS
  Administered 2017-05-06: 5 mg via INTRAVENOUS
  Administered 2017-05-06: 10 mg via INTRAVENOUS

## 2017-05-06 MED ORDER — ACETAMINOPHEN 650 MG RE SUPP: 650.0000 mg | RECTAL | Status: DC | PRN

## 2017-05-06 MED ORDER — FERROUS SULFATE 325 (65 FE) MG PO TABS
325.0000 mg | ORAL_TABLET | Freq: Two times a day (BID) | ORAL | Status: DC
  Administered 2017-05-06 – 2017-05-08 (×4): 325 mg via ORAL
  Filled 2017-05-06 (×4): qty 1

## 2017-05-06 MED ORDER — MORPHINE SULFATE (PF) 2 MG/ML IV SOLN
2.0000 mg | INTRAVENOUS | Status: DC | PRN
  Administered 2017-05-06: 2 mg via INTRAVENOUS
  Filled 2017-05-06: qty 1

## 2017-05-06 MED ORDER — MIDAZOLAM HCL 2 MG/2ML IJ SOLN
INTRAMUSCULAR | Status: DC | PRN
  Administered 2017-05-06: 1 mg via INTRAVENOUS

## 2017-05-06 MED ORDER — ENOXAPARIN SODIUM 30 MG/0.3ML ~~LOC~~ SOLN
30.0000 mg | Freq: Two times a day (BID) | SUBCUTANEOUS | Status: DC
  Administered 2017-05-07 – 2017-05-08 (×3): 30 mg via SUBCUTANEOUS
  Filled 2017-05-06 (×3): qty 0.3

## 2017-05-06 MED ORDER — PHENOL 1.4 % MT LIQD
1.0000 | OROMUCOSAL | Status: DC | PRN
  Filled 2017-05-06: qty 177

## 2017-05-06 MED ORDER — CELECOXIB 200 MG PO CAPS
200.0000 mg | ORAL_CAPSULE | Freq: Two times a day (BID) | ORAL | Status: DC
  Administered 2017-05-06 – 2017-05-08 (×5): 200 mg via ORAL
  Filled 2017-05-06 (×5): qty 1

## 2017-05-06 MED ORDER — GLYCOPYRROLATE 0.2 MG/ML IJ SOLN
INTRAMUSCULAR | Status: DC | PRN
  Administered 2017-05-06: 0.2 mg via INTRAVENOUS

## 2017-05-06 MED ORDER — FENTANYL CITRATE (PF) 100 MCG/2ML IJ SOLN
25.0000 ug | INTRAMUSCULAR | Status: DC | PRN
  Administered 2017-05-06: 25 ug via INTRAVENOUS

## 2017-05-06 MED ORDER — OXYCODONE HCL 5 MG PO TABS: 5.0000 mg | ORAL_TABLET | ORAL | Status: DC | PRN

## 2017-05-06 MED ORDER — ESCITALOPRAM OXALATE 10 MG PO TABS
10.0000 mg | ORAL_TABLET | Freq: Every day | ORAL | Status: DC
  Administered 2017-05-06 – 2017-05-08 (×3): 10 mg via ORAL
  Filled 2017-05-06 (×3): qty 1

## 2017-05-06 MED ORDER — ACETAMINOPHEN 10 MG/ML IV SOLN
1000.0000 mg | Freq: Four times a day (QID) | INTRAVENOUS | Status: AC
  Administered 2017-05-06 – 2017-05-07 (×4): 1000 mg via INTRAVENOUS
  Filled 2017-05-06 (×4): qty 100

## 2017-05-06 MED ORDER — MEPERIDINE HCL 50 MG/ML IJ SOLN
25.0000 mg | Freq: Once | INTRAMUSCULAR | Status: AC
  Administered 2017-05-06: 25 mg via INTRAVENOUS
  Filled 2017-05-06: qty 1

## 2017-05-06 MED ORDER — ONDANSETRON HCL 4 MG/2ML IJ SOLN
INTRAMUSCULAR | Status: DC | PRN
  Administered 2017-05-06: 4 mg via INTRAVENOUS

## 2017-05-06 MED ORDER — ACETAMINOPHEN 10 MG/ML IV SOLN
INTRAVENOUS | Status: DC | PRN
  Administered 2017-05-06: 1000 mg via INTRAVENOUS

## 2017-05-06 MED ORDER — AMLODIPINE BESYLATE 5 MG PO TABS
5.0000 mg | ORAL_TABLET | Freq: Every day | ORAL | Status: DC
  Administered 2017-05-07: 5 mg via ORAL
  Filled 2017-05-06: qty 1

## 2017-05-06 MED ORDER — OXYCODONE HCL 5 MG PO TABS: 5.0000 mg | ORAL_TABLET | Freq: Once | ORAL | Status: DC | PRN

## 2017-05-06 MED ORDER — BUPIVACAINE HCL (PF) 0.5 % IJ SOLN
INTRAMUSCULAR | Status: DC | PRN
  Administered 2017-05-06: 2.5 mL

## 2017-05-06 MED ORDER — ACETAMINOPHEN 325 MG PO TABS
650.0000 mg | ORAL_TABLET | ORAL | Status: DC | PRN
  Administered 2017-05-08: 650 mg via ORAL
  Filled 2017-05-06: qty 2

## 2017-05-06 MED ORDER — BISACODYL 10 MG RE SUPP: 10.0000 mg | Freq: Every day | RECTAL | Status: DC | PRN

## 2017-05-06 MED ORDER — SENNOSIDES-DOCUSATE SODIUM 8.6-50 MG PO TABS
1.0000 | ORAL_TABLET | Freq: Two times a day (BID) | ORAL | Status: DC
  Administered 2017-05-06 – 2017-05-08 (×4): 1 via ORAL
  Filled 2017-05-06 (×3): qty 1

## 2017-05-06 MED ORDER — SODIUM CHLORIDE 0.9 % IV SOLN
INTRAVENOUS | Status: DC
  Administered 2017-05-06: 07:00:00 via INTRAVENOUS

## 2017-05-06 MED ORDER — LIDOCAINE HCL (PF) 2 % IJ SOLN
INTRAMUSCULAR | Status: AC
  Filled 2017-05-06: qty 10

## 2017-05-06 MED ORDER — LORAZEPAM 0.5 MG PO TABS
0.5000 mg | ORAL_TABLET | Freq: Three times a day (TID) | ORAL | Status: DC | PRN
  Administered 2017-05-06 – 2017-05-08 (×4): 0.5 mg via ORAL
  Filled 2017-05-06 (×5): qty 1

## 2017-05-06 MED ORDER — ONDANSETRON HCL 4 MG/2ML IJ SOLN
4.0000 mg | Freq: Four times a day (QID) | INTRAMUSCULAR | Status: DC | PRN
  Administered 2017-05-06: 4 mg via INTRAVENOUS
  Filled 2017-05-06: qty 2

## 2017-05-06 SURGICAL SUPPLY — 53 items
1.25 wire ×2 IMPLANT
BLADE DRUM FLTD (BLADE) ×2 IMPLANT
BLADE SAW 1 (BLADE) ×2 IMPLANT
CANISTER SUCT 1200ML W/VALVE (MISCELLANEOUS) ×2 IMPLANT
CANISTER SUCT 3000ML PPV (MISCELLANEOUS) ×4 IMPLANT
CAPT HIP TOTAL 2 ×2 IMPLANT
CARTRIDGE OIL MAESTRO DRILL (MISCELLANEOUS) ×1 IMPLANT
CATH FOL LEG HOLDER (MISCELLANEOUS) ×2 IMPLANT
CATH TRAY METER 16FR LF (MISCELLANEOUS) ×2 IMPLANT
DIFFUSER MAESTRO (MISCELLANEOUS) ×2 IMPLANT
DRAPE INCISE IOBAN 66X60 STRL (DRAPES) ×2 IMPLANT
DRAPE SHEET LG 3/4 BI-LAMINATE (DRAPES) ×2 IMPLANT
DRSG DERMACEA 8X12 NADH (GAUZE/BANDAGES/DRESSINGS) ×2 IMPLANT
DRSG OPSITE POSTOP 4X12 (GAUZE/BANDAGES/DRESSINGS) ×2 IMPLANT
DRSG OPSITE POSTOP 4X14 (GAUZE/BANDAGES/DRESSINGS) IMPLANT
DRSG TEGADERM 4X4.75 (GAUZE/BANDAGES/DRESSINGS) ×2 IMPLANT
DURAPREP 26ML APPLICATOR (WOUND CARE) ×2 IMPLANT
ELECT BLADE 6.5 EXT (BLADE) ×2 IMPLANT
ELECT CAUTERY BLADE 6.4 (BLADE) ×2 IMPLANT
EVACUATOR 1/8 PVC DRAIN (DRAIN) ×2 IMPLANT
GLOVE BIOGEL M STRL SZ7.5 (GLOVE) ×4 IMPLANT
GLOVE BIOGEL PI IND STRL 9 (GLOVE) ×1 IMPLANT
GLOVE BIOGEL PI INDICATOR 9 (GLOVE) ×1
GLOVE INDICATOR 8.0 STRL GRN (GLOVE) ×2 IMPLANT
GLOVE SURG SYN 9.0  PF PI (GLOVE) ×1
GLOVE SURG SYN 9.0 PF PI (GLOVE) ×1 IMPLANT
GOWN STRL REUS W/ TWL LRG LVL3 (GOWN DISPOSABLE) ×2 IMPLANT
GOWN STRL REUS W/TWL 2XL LVL3 (GOWN DISPOSABLE) ×2 IMPLANT
GOWN STRL REUS W/TWL LRG LVL3 (GOWN DISPOSABLE) ×2
HOOD PEEL AWAY FLYTE STAYCOOL (MISCELLANEOUS) ×4 IMPLANT
KIT RM TURNOVER STRD PROC AR (KITS) ×2 IMPLANT
NDL SAFETY 18GX1.5 (NEEDLE) ×2 IMPLANT
NS IRRIG 500ML POUR BTL (IV SOLUTION) ×2 IMPLANT
OIL CARTRIDGE MAESTRO DRILL (MISCELLANEOUS) ×2
PACK HIP PROSTHESIS (MISCELLANEOUS) ×2 IMPLANT
PULSAVAC PLUS IRRIG FAN TIP (DISPOSABLE) ×2
SOL .9 NS 3000ML IRR  AL (IV SOLUTION) ×1
SOL .9 NS 3000ML IRR UROMATIC (IV SOLUTION) ×1 IMPLANT
SOL PREP PVP 2OZ (MISCELLANEOUS) ×2
SOLUTION PREP PVP 2OZ (MISCELLANEOUS) ×1 IMPLANT
SPONGE DRAIN TRACH 4X4 STRL 2S (GAUZE/BANDAGES/DRESSINGS) ×2 IMPLANT
STAPLER SKIN PROX 35W (STAPLE) ×2 IMPLANT
SUT ETHIBOND #5 BRAIDED 30INL (SUTURE) ×2 IMPLANT
SUT VIC AB 0 CT1 36 (SUTURE) ×2 IMPLANT
SUT VIC AB 1 CT1 36 (SUTURE) ×4 IMPLANT
SUT VIC AB 2-0 CT1 27 (SUTURE) ×1
SUT VIC AB 2-0 CT1 TAPERPNT 27 (SUTURE) ×1 IMPLANT
SYR 20CC LL (SYRINGE) ×2 IMPLANT
TAPE ADH 3 LX (MISCELLANEOUS) ×2 IMPLANT
TAPE TRANSPORE STRL 2 31045 (GAUZE/BANDAGES/DRESSINGS) ×2 IMPLANT
TIP FAN IRRIG PULSAVAC PLUS (DISPOSABLE) ×1 IMPLANT
TOWEL OR 17X26 4PK STRL BLUE (TOWEL DISPOSABLE) ×2 IMPLANT
WIRE CERLCAGE 1.25 280 (WIRE) ×2 IMPLANT

## 2017-05-06 NOTE — Transfer of Care (Signed)
Immediate Anesthesia Transfer of Care Note  Patient: Darrell Clark  Procedure(s) Performed: TOTAL HIP ARTHROPLASTY (Left Hip)  Patient Location: PACU  Anesthesia Type:Spinal  Level of Consciousness: awake, alert  and oriented  Airway & Oxygen Therapy: Patient Spontanous Breathing and Patient connected to face mask oxygen  Post-op Assessment: Report given to RN and Post -op Vital signs reviewed and stable  Post vital signs: Reviewed and stable  Last Vitals:  Vitals:   05/06/17 0617 05/06/17 1149  BP: 138/66 93/83  Pulse: 70 66  Resp: 20   Temp: 36.6 C (!) 36.3 C  SpO2: 100% 97%    Last Pain:  Vitals:   05/06/17 0617  TempSrc: Tympanic  PainSc: 3          Complications: No apparent anesthesia complications

## 2017-05-06 NOTE — H&P (Signed)
The patient has been re-examined, and the chart reviewed, and there have been no interval changes to the documented history and physical.    The risks, benefits, and alternatives have been discussed at length. The patient expressed understanding of the risks benefits and agreed with plans for surgical intervention.  Aldine Chakraborty P. Casandra Dallaire, Jr. M.D.    

## 2017-05-06 NOTE — Evaluation (Signed)
Physical Therapy Evaluation Patient Details Name: Darrell Clark MRN: [id number] DOB: 1933-05-01 Today's Date: 05/06/2017   History of Present Illness  Pt underwent L THR posterior approach without reported post-op complications. He is POD#0 at time of PT evaluation. PMH includes CVA, anxiety, depression, CAD, mild dementia, DM, and spinal stenosis  Clinical Impression  Pt admitted with above diagnosis. Pt currently with functional limitations due to the deficits listed below (see PT Problem List).  Pt requiring modA+1 for bed mobility and transfers. Heavy cues for proper sequencing and hand placement. Decreased weight shifting to LLE. Once in standing pt reports feeling dizzy and lightheaded. States that he feels shaky like his blood sugar is low. Pt requires assist back to sitting. RN is present and helps take vitals. BP is elevated but pt is not hypotensive. Blood sugar is WNL as checked by RN. Pt reports he is unable to ambulate at this time due to feeling dizzy and shaky so returned back to bed. Deferred ambulation to next session. Pt is able to complete all supine exercises as instructed by therapist. He has difficulty understanding L hip posterior precautions. Pt will need SNF placement at discharge. Pt will benefit from PT services to address deficits in strength, balance, and mobility in order to return to full function at home.     Follow Up Recommendations SNF    Equipment Recommendations  None recommended by PT;Other (comment)(TBD further at [facility])    Recommendations for Other Services OT consult     Precautions / Restrictions Precautions Precautions: Fall;Posterior Hip Precaution Booklet Issued: Yes (comment) Precaution Comments: Pt with difficulty understanding precautions Restrictions Weight Bearing Restrictions: Yes LLE Weight Bearing: Weight bearing as tolerated      Mobility  Bed Mobility Overal bed mobility: Needs Assistance Bed Mobility: Supine to Sit;Sit to  Supine     Supine to sit: Mod assist Sit to supine: Mod assist   General bed mobility comments: Pt requires modA+1 with heavy cues for sequencing during bed mobility. HOB elevated, use of bed rails, and increased time required to perform. Pt with considerable increase in pain with all bed mobility.   Transfers Overall transfer level: Needs assistance Equipment used: Rolling walker (2 wheeled) Transfers: Sit to/from Stand Sit to Stand: Mod assist;+2 safety/equipment         General transfer comment: Pt requiring considerable assistance for sit to stand transfers. Heavy cues for proper sequencing and hand placement. Decreased weight shifting to LLE. Once in standing pt reports feeling dizzy and lightheaded. States that he feels shaky like his blood sugar is low. Pt requires assist back to sitting. RN is present and helps take vitals. BP is elevated but pt is not hypotensive. Blood sugar is not low as checked by RN. Pt reports he is unable to ambulate at this time so returned back to bed  Ambulation/Gait             General Gait Details: Pt is unable to ambulate at this time  Stairs            Wheelchair Mobility    Modified Rankin (Stroke Patients Only)       Balance Overall balance assessment: Needs assistance Sitting-balance support: No upper extremity supported Sitting balance-Leahy Scale: Good     Standing balance support: Bilateral upper extremity supported Standing balance-Leahy Scale: Fair Standing balance comment: Requires UE support on rolling walker for ambulation  Pertinent Vitals/Pain Pain Assessment: 0-10 Pain Score: 10-Worst pain ever Pain Location: L hip Pain Descriptors / Indicators: Operative site guarding Pain Intervention(s): Monitored during session;Premedicated before session    Home Living Family/patient expects to be discharged to:: Private residence Living Arrangements: Alone Available Help at  Discharge: Family;Other (Comment)(Family works) Type of Home: House Home Access: [place]: One [place]: [place] - single point;Grab bars - tub/shower;Walker - 4 wheels;Shower seat;Bedside commode;Wheelchair - manual      Prior Function Level of Independence: Needs assistance   Gait / Transfers Assistance Needed: Single point cane for ambulation due to back pain  ADL's / Homemaking Assistance Needed: Requires assist with ADLs/IADLs from [facility] aid and family  Comments: Pt has L sided visual field deficits     Hand Dominance   Dominant Hand: Right    Extremity/Trunk Assessment   Upper Extremity Assessment Upper Extremity Assessment: Overall WFL for tasks assessed    Lower Extremity Assessment Lower Extremity Assessment: LLE deficits/detail LLE Deficits / Details: Pt requires assist for L SLR. Able to perform SAQ without assistance. Reports fully intact sensation to light touch throughout LLE       Communication   Communication: No difficulties  Cognition Arousal/Alertness: Awake/alert Behavior During Therapy: Restless Overall Cognitive Status: Within Functional Limits for tasks assessed                                        General Comments      Exercises Total Joint Exercises Ankle Circles/Pumps: AROM;Both;10 reps;Supine Quad Sets: Strengthening;Both;10 reps;Supine Gluteal Sets: Strengthening;Both;10 reps;Supine Towel Squeeze: Strengthening;Both;10 reps;Supine Short Arc Quad: Strengthening;Left;10 reps;Supine Heel Slides: Strengthening;Left;10 reps;Supine Hip ABduction/ADduction: Strengthening;Left;10 reps;Supine Straight Leg Raises: Strengthening;Left;10 reps;Supine   Assessment/Plan    PT Assessment Patient needs continued PT services  PT Problem List Decreased strength;Decreased range of motion;Decreased activity tolerance;Decreased balance;Decreased mobility;Pain       PT Treatment Interventions DME  instruction;Gait training;Stair training;Functional mobility training;Therapeutic activities;Therapeutic exercise;Balance training;Neuromuscular re-education;Cognitive remediation;Patient/family education;Manual techniques    PT Goals (Current goals can be found in the Care Plan section)  Acute Rehab PT Goals Patient Stated Goal: Return to prior function at home PT Goal Formulation: With patient Time For Goal Achievement: 05/20/17 Potential to Achieve Goals: Good    Frequency BID   Barriers to discharge Decreased caregiver support Pt lives alone. Family works during the day    Co-evaluation               AM-PAC PT "6 Clicks" Daily Activity  Outcome Measure Difficulty turning over in bed (including adjusting bedclothes, sheets and blankets)?: Unable Difficulty moving from lying on back to sitting on the side of the bed? : Unable Difficulty sitting down on and standing up from a chair with arms (e.g., wheelchair, bedside commode, etc,.)?: Unable Help needed moving to and from a bed to chair (including a wheelchair)?: Total Help needed walking in hospital room?: Total Help needed climbing 3-5 steps with a railing? : Total 6 Click Score: 6    End of Session Equipment Utilized During Treatment: Gait belt Activity Tolerance: Treatment limited secondary to medical complications (Comment) Patient left: in bed;with call bell/phone within reach;with bed alarm set Nurse Communication: Mobility status PT Visit Diagnosis: Unsteadiness on feet (R26.81);Other abnormalities of gait and mobility (R26.89);Muscle weakness (generalized) (M62.81);Pain Pain - Right/Left: Left Pain - part of body: Hip  Time: 3704-8889 PT Time Calculation (min) (ACUTE ONLY): 34 min   Charges:   PT Evaluation $PT Eval Low Complexity: 1 Low PT Treatments $Therapeutic Exercise: 8-22 mins   PT G Codes:   PT G-Codes **NOT FOR INPATIENT CLASS** Functional Assessment Tool Used: AM-PAC 6 Clicks Basic  Mobility Functional Limitation: Mobility: Walking and moving around Mobility: Walking and Moving Around Current Status (V6945): 100 percent impaired, limited or restricted Mobility: Walking and Moving Around Goal Status (W3888): At least 20 percent but less than 40 percent impaired, limited or restricted    Phillips Grout PT, DPT    Akyla Vavrek 05/06/2017, 5:28 PM

## 2017-05-06 NOTE — Anesthesia Procedure Notes (Addendum)
Spinal  Patient location during procedure: OR Start time: 05/06/2017 7:45 AM End time: 05/06/2017 7:51 AM Staffing Anesthesiologist: Emmie Niemann, MD Resident/CRNA: Johnna Acosta, CRNA Performed: anesthesiologist and resident/CRNA  Preanesthetic Checklist Completed: patient identified, site marked, surgical consent, pre-op evaluation, timeout performed, IV checked, risks and benefits discussed and monitors and equipment checked Spinal Block Patient position: sitting Prep: ChloraPrep Patient monitoring: heart rate, continuous pulse ox, blood pressure and cardiac monitor Approach: midline Location: L3-4 Injection technique: single-shot Needle Needle type: Introducer and Pencil-Tip  Needle gauge: 24 G Needle length: 9 cm Assessment Sensory level: T10 Additional Notes Negative paresthesia. Negative blood return. Positive free-flowing CSF. Expiration date of kit checked and confirmed. Patient tolerated procedure well, without complications.

## 2017-05-06 NOTE — Anesthesia Post-op Follow-up Note (Signed)
Anesthesia QCDR form completed.        

## 2017-05-06 NOTE — Anesthesia Procedure Notes (Signed)
Date/Time: 05/06/2017 7:40 AM Performed by: Johnna Acosta, CRNA Pre-anesthesia Checklist: Patient identified, Emergency Drugs available, Suction available, Patient being monitored and Timeout performed Patient Re-evaluated:Patient Re-evaluated prior to induction Oxygen Delivery Method: Simple face mask Preoxygenation: Pre-oxygenation with 100% oxygen

## 2017-05-06 NOTE — Progress Notes (Signed)
Admission:  Patient alert and oriented. [place] and hemovac in place. Patient has no skin issues. Vitals stable. Patient has intermittent confusion./ Complaining of some pain on admission. Patient nor complying to hip precautions. RN educated patient to hip precautions. RN notified MD and called for hip abduction pillow but was told they are on back order. RN has pillows between knees. Rn has educated patient family on hip precautions.   Deri Fuelling, RN

## 2017-05-06 NOTE — Discharge Instructions (Signed)
Instructions after Total Hip Replacement ° ° °  Champion Corales P. Saje Gallop, Jr., M.D.    ° Dept. of Orthopaedics & Sports Medicine ° [facility] ° [address] ° [place], [place]  [address] ° Phone: [phone]   Fax: [phone] ° °  °DIET: °• Drink plenty of non-alcoholic fluids. °• Resume your normal diet. Include foods high in fiber. ° °ACTIVITY:  °• You may use crutches or a walker with weight-bearing as tolerated, unless instructed otherwise. °• You may be weaned off of the walker or crutches by your Physical Therapist.  °• Do NOT reach below the level of your knees or cross your legs until allowed.    °• Continue doing gentle exercises. Exercising will reduce the pain and swelling, increase motion, and prevent muscle weakness.   °• Please continue to use the TED compression stockings for 6 weeks. You may remove the stockings at night, but should reapply them in the morning. °• Do not drive or operate any equipment until instructed. ° °WOUND CARE:  °• Continue to use ice packs periodically to reduce pain and swelling. °• Keep the incision clean and dry. °• You may bathe or shower after the staples are removed at the first office visit following surgery. ° °MEDICATIONS: °• You may resume your regular medications. °• Please take the pain medication as prescribed on the medication. °• Do not take pain medication on an empty stomach. °• You have been given a prescription for a blood thinner to prevent blood clots. Please take the medication as instructed. (NOTE: After completing a 2 week course of Lovenox, take one Enteric-coated aspirin once a day.) °• Pain medications and iron supplements can cause constipation. Use a stool softener (Senokot or Colace) on a daily basis and a laxative (dulcolax or miralax) as needed. °• Do not drive or drink alcoholic beverages when taking pain medications. ° °CALL THE OFFICE FOR: °• Temperature above 101 degrees °• Excessive bleeding or drainage on the dressing. °• Excessive  swelling, coldness, or paleness of the toes. °• Persistent nausea and vomiting. ° °FOLLOW-UP:  °• You should have an appointment to return to the office in 6 weeks after surgery. °• Arrangements have been made for continuation of Physical Therapy (either home therapy or outpatient therapy). °  °

## 2017-05-06 NOTE — Anesthesia Preprocedure Evaluation (Addendum)
Anesthesia Evaluation  Patient identified by MRN, date of birth, ID band Patient awake    Reviewed: Allergy & Precautions, NPO status , Patient's Chart, lab work & pertinent test results  History of Anesthesia Complications Negative for: history of anesthetic complications  Airway Mallampati: II  TM Distance: >3 FB Neck ROM: Full    Dental  (+) Edentulous Upper, Edentulous Lower   Pulmonary neg sleep apnea, neg COPD, former smoker,    breath sounds clear to auscultation- rhonchi (-) wheezing      Cardiovascular hypertension, Pt. on medications + CAD  (-) Cardiac Stents and (-) CABG  Rhythm:Regular Rate:Normal - Systolic murmurs and - Diastolic murmurs    Neuro/Psych PSYCHIATRIC DISORDERS Anxiety Depression CVA    GI/Hepatic Neg liver ROS, GERD  ,  Endo/Other  diabetes, Oral Hypoglycemic Agents  Renal/GU negative Renal ROS     Musculoskeletal  (+) Arthritis ,   Abdominal (+) + obese,   Peds  Hematology negative hematology ROS (+)   Anesthesia Other Findings Past Medical History: No date: Acquired cyst of kidney No date: Allergy No date: Anxiety No date: Arthritis No date: Atherosclerosis No date: BPH (benign prostatic hyperplasia) No date: CAD (coronary artery disease) No date: Cardiac arrhythmia No date: Chronic prostatitis No date: Degenerative arthritis No date: Dementia     Comment:  mild No date: Depression No date: Diabetes mellitus without complication (HCC) No date: Dyspnea     Comment:  with exertion No date: Elevated prostate specific antigen (PSA) No date: Encysted hydrocele No date: GERD (gastroesophageal reflux disease) No date: Glaucoma (increased eye pressure) No date: Gout No date: Gross hematuria No date: HLD (hyperlipidemia) No date: Hyperlipidemia, unspecified No date: Hypertension No date: Hypertrophy of breast No date: Impotence of organic origin No date: Incomplete bladder  emptying No date: Malignant neoplasm of prostate ([place]) No date: Other specified disorders of the male genital organs No date: Prostate cancer ([place]) No date: PVD (peripheral vascular disease) ([place]) No date: Sciatica No date: Spermatocele 11/11/2015: Spinal stenosis of lumbar region with radiculopathy No date: Stroke [facility]) No date: Testicular hypofunction     Comment:  other No date: Urinary frequency No date: Urinary obstruction     Comment:  not elsewhere classified No date: Vertigo   Reproductive/Obstetrics                             Anesthesia Physical Anesthesia Plan  ASA: III  Anesthesia Plan: Spinal   Post-op Pain Management:    Induction:   PONV Risk Score and Plan: 1 and Propofol infusion  Airway Management Planned: Natural Airway  Additional Equipment:   Intra-op Plan:   Post-operative Plan:   Informed Consent: I have reviewed the patients History and Physical, chart, labs and discussed the procedure including the risks, benefits and alternatives for the proposed anesthesia with the patient or authorized representative who has indicated his/her understanding and acceptance.   Dental advisory given  Plan Discussed with: CRNA and Anesthesiologist  Anesthesia Plan Comments: (Date of last plavix administration >5 days prior, verified with pt's son who administers his medications)       Anesthesia Quick Evaluation

## 2017-05-06 NOTE — Op Note (Signed)
OPERATIVE NOTE  DATE OF SURGERY:  05/06/2017  PATIENT NAME:  Darrell Clark   DOB: 1932/07/13  MRN: [id number]  PRE-OPERATIVE DIAGNOSIS: Degenerative arthrosis of the left hip, primary  POST-OPERATIVE DIAGNOSIS:  Same  PROCEDURE:  Left total hip arthroplasty  SURGEON:  Marciano Sequin. M.D.  ASSISTANT:  Vance Peper, PA (present and scrubbed throughout the case, critical for assistance with exposure, retraction, instrumentation, and closure)  ANESTHESIA: spinal  ESTIMATED BLOOD LOSS: 700 mL  FLUIDS REPLACED: 1600 mL of crystalloid  DRAINS: 2 medium drains to a Hemovac reservoir  IMPLANTS UTILIZED: DePuy 13.5 mm small stature AML femoral stem, 54 mm OD Pinnacle 100 acetabular component, neutral Pinnacle Marathon polyethylene insert, and a 36 mm M-SPEC =2 mm hip ball  INDICATIONS FOR SURGERY: Darrell Clark is a 83 y.o. year old male with a long history of progressive hip and groin  pain. X-rays demonstrated severe degenerative changes. The patient had not seen any significant improvement despite conservative nonsurgical intervention. After discussion of the risks and benefits of surgical intervention, the patient expressed understanding of the risks benefits and agree with plans for total hip arthroplasty.   The risks, benefits, and alternatives were discussed at length including but not limited to the risks of infection, bleeding, nerve injury, stiffness, blood clots, the need for revision surgery, limb length inequality, dislocation, cardiopulmonary complications, among others, and they were willing to proceed.  PROCEDURE IN DETAIL: The patient was brought into the operating room and, after adequate spinal anesthesia was achieved, the patient was placed in a right lateral decubitus position. Axillary roll was placed and all bony prominences were well-padded. The patient's left hip was cleaned and prepped with alcohol and DuraPrep and draped in the usual sterile fashion. A "timeout"  was performed as per usual protocol. A lateral curvilinear incision was made gently curving towards the posterior superior iliac spine. The IT band was incised in line with the skin incision and the fibers of the gluteus maximus were split in line. The piriformis tendon was identified, skeletonized, and incised at its insertion to the proximal femur and reflected posteriorly. A T type posterior capsulotomy was performed. Prior to dislocation of the femoral head, a threaded Steinmann pin was inserted through a separate stab incision into the pelvis superior to the acetabulum and bent in the form of a stylus so as to assess limb length and hip offset throughout the procedure. The femoral head was then dislocated posteriorly. Inspection of the femoral head demonstrated severe degenerative changes with full-thickness loss of articular cartilage. The femoral neck cut was performed using an oscillating saw. The anterior capsule was elevated off of the femoral neck using a periosteal elevator.  The gluteal sling was incised so as to better mobilize the proximal femur.  Attention was then directed to the acetabulum. The remnant of the labrum was excised using electrocautery. Inspection of the acetabulum also demonstrated significant degenerative changes. The acetabulum was reamed in sequential fashion up to a 53 mm diameter. Good punctate bleeding bone was encountered. A 56 mm Pinnacle 100 acetabular component was positioned and impacted into place. Good scratch fit was appreciated. A neutral polyethylene trial was inserted.  Attention was then directed to the proximal femur. A hole for reaming of the proximal femoral canal was created using a high-speed burr. The femoral canal was reamed in sequential fashion up to a 13 mm diameter. This allowed for approximately 7 cm of scratch fit.  It was thus elected to ream up  to a 13.5 mm diameter to allow for a line to line fit.  Serial broaches were inserted up to a 13.5 mm small  stature femoral broach.  A small crack was noted in the calcar region.  A 1.25 mm cerclage wire was just above the lesser trochanter and tensioned appropriately to prevent possible propagation of the crack.  The calcar region was planed and a trial reduction was performed using a 36 mm hip ball with a -2 mm neck length. Good equalization of limb lengths and hip offset was appreciated and excellent stability was noted both anteriorly and posteriorly. Trial components were removed. The acetabular shell was irrigated with copious amounts of normal saline with antibiotic solution and suctioned dry. A neutral Pinnacle Marathon polyethylene insert was positioned and impacted into place. Next, a 13.5 mm small stature AML femoral stem was positioned and impacted into place. Excellent scratch fit was appreciated. A trial reduction was again performed with a 36 mm hip ball with a -2 mm neck length. Again, good equalization of limb lengths was appreciated and excellent stability appreciated both anteriorly and posteriorly. The hip was then dislocated and the trial hip ball was removed. The Morse taper was cleaned and dried. A 36 mm M-SPEC hip ball with a -2 mm neck length was placed on the trunnion and impacted into place. The hip was then reduced and placed through range of motion. Excellent stability was appreciated both anteriorly and posteriorly.  The wound was irrigated with copious amounts of normal saline with antibiotic solution and suctioned dry. Good hemostasis was appreciated. The posterior capsulotomy was repaired using #5 Ethibond. Piriformis tendon was reapproximated to the undersurface of the gluteus medius tendon using #5 Ethibond.  The gluteal sling was repaired using interrupted sutures of #5 Ethibond.  Two medium drains were placed in the wound bed and brought out through separate stab incisions to be attached to a Hemovac reservoir. The IT band was reapproximated using interrupted sutures of #1 Vicryl.  Subcutaneous tissue was approximated using first #0 Vicryl followed by #2-0 Vicryl. The skin was closed with skin staples.  The patient tolerated the procedure well and was transported to the recovery room in stable condition.   Marciano Sequin., M.D.

## 2017-05-07 ENCOUNTER — Encounter
Admission: RE | Admit: 2017-05-07 | Discharge: 2017-05-07 | Disposition: A | Discharge status: Home / Self Care | Payer: Medicare Other | Source: Ambulatory Visit | Attending: Internal Medicine | Admitting: Internal Medicine

## 2017-05-07 ENCOUNTER — Encounter: Payer: Self-pay | Admitting: Orthopedic Surgery

## 2017-05-07 LAB — GLUCOSE, CAPILLARY
GLUCOSE-CAPILLARY: 125 mg/dL — AB (ref 65–99)
GLUCOSE-CAPILLARY: 104 mg/dL — AB (ref 65–99)
GLUCOSE-CAPILLARY: 136 mg/dL — AB (ref 65–99)
Glucose-Capillary: 126 mg/dL — ABNORMAL HIGH (ref 65–99)

## 2017-05-07 NOTE — Progress Notes (Signed)
Physical Therapy Treatment Patient Details Name: Darrell Clark MRN: [id number] DOB: 08-12-1932 Today's Date: 05/07/2017    History of Present Illness Pt. is an 83 y.o. male who was admitted to [facility] for a Left THR, posterior approach. Pt. PMHx includes: CVA, Anxiety, depression, CAD, mild Dementia, DM, and spinal stenosis.    PT Comments    Pt continues to progress very slowly with therapy. He requires maxA+2 and two attempts to come to standing. Pt takes short, unsteady steps to get back to bed from recliner. RLE buckling. Heavy cues for safety and proper sequencing with rolling walker. He is able to complete all seated and supine exercises with progressive improvement in L hip flexion strength. He continues to require SNF placement at discharge. Pt will benefit from PT services to address deficits in strength, balance, and mobility in order to return to full function at home.     Follow Up Recommendations  SNF     Equipment Recommendations  None recommended by PT;Other (comment)(TBD further at SNF)    Recommendations for Other Services OT consult     Precautions / Restrictions Precautions Precautions: Fall;Posterior Hip Precaution Booklet Issued: Yes (comment) Precaution Comments: Pt with difficulty understanding precautions Restrictions Weight Bearing Restrictions: Yes LLE Weight Bearing: Weight bearing as tolerated    Mobility  Bed Mobility Overal bed mobility: Needs Assistance Bed Mobility: Sit to Supine       Sit to supine: Mod assist   General bed mobility comments: Bilateral LE and trunk assist when returning to bed. Pt able to scoot himself toward [facility] with bed rails and RLE. Increased pain with bed mobility  Transfers Overall transfer level: Needs assistance Equipment used: Rolling walker (2 wheeled) Transfers: Sit to/from Stand Sit to Stand: Max assist;+2 physical assistance         General transfer comment: Pt requires cues for safe hand placement.  Two attempts required to come to standing with rolling walker. Pt with significant bilatearal LE weakness. Requires cues for upright posture once in standing  Ambulation/Gait Ambulation/Gait assistance: Mod assist;+2 physical assistance   Assistive device: Rolling walker (2 wheeled) Gait Pattern/deviations: Decreased step length - left;Decreased step length - right;Decreased weight shift to left Gait velocity: Decreased Gait velocity interpretation: <1.8 ft/sec, indicative of risk for recurrent falls General Gait Details: Pt takes short, unsteady steps to get back to bed from recliner. RLE buckling. Heavy cues for safety and proper sequencing with rolling walker   Stairs            Wheelchair Mobility    Modified Rankin (Stroke Patients Only)       Balance Overall balance assessment: Needs assistance Sitting-balance support: No upper extremity supported Sitting balance-Leahy Scale: Good     Standing balance support: Bilateral upper extremity supported Standing balance-Leahy Scale: Fair Standing balance comment: Requires UE support on rolling walker for ambulation                            Cognition Arousal/Alertness: Awake/alert Behavior During Therapy: Flat affect Overall Cognitive Status: Within Functional Limits for tasks assessed                                        Exercises Total Joint Exercises Hip ABduction/ADduction: Strengthening;Left;15 reps;Seated Straight Leg Raises: Strengthening;Left;Supine;15 reps Long Arc Quad: Strengthening;Left;15 reps;Seated Knee Flexion: Strengthening;Left;15 reps;Seated Marching in Standing: Strengthening;Both;15  reps;Seated;Other (comment)(2 sets)    General Comments        Pertinent Vitals/Pain Pain Assessment: 0-10 Pain Score: 3  Pain Location: L hip/L knee Pain Descriptors / Indicators: Operative site guarding Pain Intervention(s): Monitored during session    Home Living  Family/patient expects to be discharged to:: Private residence Living Arrangements: Alone Available Help at Discharge: Family;Other (Comment) Type of Home: House Home Access: Ramped entrance   Home Layout: One level Home Equipment: Cane - single point;Grab bars - tub/shower;Walker - 4 wheels;Shower seat;Bedside commode;Wheelchair - manual      Prior Function Level of Independence: Needs assistance    ADL's / Homemaking Assistance Needed: Pt. has home health aide, and family assist with ADLs, and IADLs. Pt. son assists pt. with medication management. Pt. reports being independently able to drive, however was limited to only driving to  pick up take out for his meals.      PT Goals (current goals can now be found in the care plan section) Acute Rehab PT Goals Patient Stated Goal: To return home PT Goal Formulation: With patient Time For Goal Achievement: 05/20/17 Potential to Achieve Goals: Good Progress towards PT goals: Progressing toward goals    Frequency    BID      PT Plan Current plan remains appropriate    Co-evaluation              AM-PAC PT "6 Clicks" Daily Activity  Outcome Measure  Difficulty turning over in bed (including adjusting bedclothes, sheets and blankets)?: Unable Difficulty moving from lying on back to sitting on the side of the bed? : Unable Difficulty sitting down on and standing up from a chair with arms (e.g., wheelchair, bedside commode, etc,.)?: Unable Help needed moving to and from a bed to chair (including a wheelchair)?: A Lot Help needed walking in hospital room?: A Lot Help needed climbing 3-5 steps with a railing? : Total 6 Click Score: 8    End of Session Equipment Utilized During Treatment: Gait belt Activity Tolerance: Patient tolerated treatment well Patient left: Other (comment);in bed;with bed alarm set;with call bell/phone within reach(abduction pillow between knees, ) Nurse Communication: Other (comment)(CNA assists with  ambulation) PT Visit Diagnosis: Unsteadiness on feet (R26.81);Other abnormalities of gait and mobility (R26.89);Muscle weakness (generalized) (M62.81);Pain Pain - Right/Left: Left Pain - part of body: Hip     Time: 1950-9326 PT Time Calculation (min) (ACUTE ONLY): 23 min  Charges:  $Gait Training: 8-22 mins $Therapeutic Exercise: 8-22 mins                    G Codes:       Lyndel Safe Huprich PT, DPT     Huprich,Jason 05/07/2017, 2:20 PM

## 2017-05-07 NOTE — Progress Notes (Addendum)
Subjective: 1 Day Post-Op Procedure(s) (LRB): TOTAL HIP ARTHROPLASTY (Left) Patient reports pain as 3 on 0-10 scale.  Pain much improved from yesterday.  Patient with nausea yesterday that has resolved Patient is Currently well with no complaints. Denies any CP, SOB, ABD pain, nausea, vomiting. We will continue therapy today.  Plan is to go Skilled nursing facility after hospital stay.  Objective: Vital signs in last 24 hours: Temp:  [97.3 F (36.3 C)-98.6 F (37 C)] 98.6 F (37 C) (11/19 1758) Pulse Rate:  [56-97] 80 (11/20 0506) Resp:  [12-20] 18 (11/19 1758) BP: (90-171)/(51-90) 140/61 (11/20 0506) SpO2:  [93 %-100 %] 98 % (11/20 0506)  Intake/Output from previous day: 11/19 0701 - 11/20 0700 In: 4140 [P.O.:50; I.V.:3430; IV Piggyback:660] Out: 7628 [Urine:2330; Drains:420; Blood:700] Intake/Output this shift: No intake/output data recorded.  No results for input(s): HGB in the last 72 hours. No results for input(s): WBC, RBC, HCT, PLT in the last 72 hours. No results for input(s): NA, K, CL, CO2, BUN, CREATININE, GLUCOSE, CALCIUM in the last 72 hours. No results for input(s): LABPT, INR in the last 72 hours.  EXAM General - Patient is Alert, Appropriate and Oriented Extremity - Neurovascular intact Sensation intact distally Intact pulses distally Dorsiflexion/Plantar flexion intact No cellulitis present Compartment soft Dressing - dressing C/D/I, scant drainage and Hemovac intact Motor Function - intact, moving foot and toes well on exam.   Past Medical History:  Diagnosis Date  . Acquired cyst of kidney   . Allergy   . Anxiety   . Arthritis   . Atherosclerosis   . BPH (benign prostatic hyperplasia)   . CAD (coronary artery disease)   . Cardiac arrhythmia   . Chronic prostatitis   . Degenerative arthritis   . Dementia    mild  . Depression   . Diabetes mellitus without complication ([place])   . Dyspnea    with exertion  . Elevated prostate specific  antigen (PSA)   . Encysted hydrocele   . GERD (gastroesophageal reflux disease)   . Glaucoma (increased eye pressure)   . Gout   . Gross hematuria   . HLD (hyperlipidemia)   . Hyperlipidemia, unspecified   . Hypertension   . Hypertrophy of breast   . Impotence of organic origin   . Incomplete bladder emptying   . Malignant neoplasm of prostate ([place])   . Other specified disorders of the male genital organs   . Prostate cancer ([place])   . PVD (peripheral vascular disease) ([place])   . Sciatica   . Spermatocele   . Spinal stenosis of lumbar region with radiculopathy 11/11/2015  . Stroke ([place])   . Testicular hypofunction    other  . Urinary frequency   . Urinary obstruction    not elsewhere classified  . Vertigo     Assessment/Plan:   1 Day Post-Op Procedure(s) (LRB): TOTAL HIP ARTHROPLASTY (Left) Active Problems:   Status post total replacement of hip  Estimated body mass index is 32.28 kg/m as calculated from the following:   Height as of this encounter: 5\' 10"  (1.778 m).   Weight as of this encounter: 102.1 kg (225 lb). Advance diet Up with therapy  Needs bowel movement, continue with bowel regimen medications Pain well controlled this morning. Vital signs are stable. Care management to assist with discharge to skilled nursing facility.  DVT Prophylaxis - Lovenox, Foot Pumps and TED hose Weight-Bearing as tolerated to left lower extremity   T. Rachelle Hora, PA-C [facility]  Orthopaedics 05/07/2017, 7:09 AM

## 2017-05-07 NOTE — Progress Notes (Addendum)
Social work Theatre manager presented bed offers to patient. Patient chose [facility] and is comfortable with sharing a sem-private room. Sharyn Lull, admission coordinator at [facility] is aware of accepted bed offer. CSW and social work intern will continue to follow and assist.  Susy Frizzle, Social work Theatre manager [phone])

## 2017-05-07 NOTE — Clinical Social Work Placement (Signed)
   CLINICAL SOCIAL WORK PLACEMENT  NOTE  Date:  05/07/2017  Patient Details  Name: Darrell Clark MRN: [id number] Date of Birth: 09-Jun-1933  Clinical Social Work is seeking post-discharge placement for this patient at the [place] level of care (*CSW will initial, date and re-position this form in  chart as items are completed):  Yes   Patient/family provided with [facility] Work Department's list of facilities offering this level of care within the geographic area requested by the patient (or if unable, by the patient's family).  Yes   Patient/family informed of their freedom to choose among providers that offer the needed level of care, that participate in Medicare, Medicaid or managed care program needed by the patient, have an available bed and are willing to accept the patient.  Yes   Patient/family informed of [facility]'s ownership interest in [facility] and [facility], as well as of the fact that they are under no obligation to receive care at these facilities.  PASRR submitted to EDS on       PASRR number received on       Existing PASRR number confirmed on 05/07/17     FL2 transmitted to all facilities in geographic area requested by pt/family on 05/07/17     FL2 transmitted to all facilities within larger geographic area on       Patient informed that his/her managed care company has contracts with or will negotiate with certain facilities, including the following:            Patient/family informed of bed offers received.  Patient chooses bed at       Physician recommends and patient chooses bed at      Patient to be transferred to   on  .  Patient to be transferred to facility by       Patient family notified on   of transfer.  Name of family member notified:        PHYSICIAN       Additional Comment:    _______________________________________________ Malakai Schoenherr, Veronia Beets, LCSW 05/07/2017, 9:00 AM

## 2017-05-07 NOTE — Progress Notes (Signed)
Patient alert and oriented. States pain is much better today. Honeycomb dressing clean dry and intact.   Deri Fuelling, RN

## 2017-05-07 NOTE — Clinical Social Work Note (Addendum)
Clinical Social Work Assessment  Patient Details  Name: Darrell Clark MRN: [id number] Date of Birth: 1932/07/19  Date of referral:  05/07/17               Reason for consult:  Facility Placement, Discharge Planning                Permission sought to share information with:  Chartered certified accountant granted to share information::  Yes, Verbal Permission Granted  Name::      [place]::   [place]  Relationship::     Contact Information:     Housing/Transportation Living arrangements for the past 2 months:  [place] of Information:  Patient Patient Interpreter Needed:  None Criminal Activity/Legal Involvement Pertinent to Current Situation/Hospitalization:  No - Comment as needed Significant Relationships:  Adult Children Lives with:  Self Do you feel safe going back to the place where you live?  Yes Need for family participation in patient care:  Yes (Comment)  Care giving concerns: Patient lives alone in [place], [place].   Social Worker assessment / plan: [place] representative (CSW) received SNF consult. PT is recommending SNF. Social work Theatre manager met with patient alone. Patient was laying in bed alert and oriented x4. Social work Theatre manager introduced self and explained the role of the [place]. Patient stated that he lives in [place] alone. Patient also stated that his son Darrell Clark [phone]) is his HPOA. Social work Theatre manager explained that PT is recommending SNF and the SNF process. Patient is agreeable to SNF search but if he makes progression with PT would like to go home with [facility]. RN Case Manager aware of above. Patient also stated that he prefers [place]. FL2 completed and faxed out. CSW and social work Theatre manager will continue to follow up and assist.   Employment status:  Retired Nurse, adult PT Recommendations:  [place] / Referral to  community resources:  [place]  Patient/Family's Response to care: Patient is agreeable to [facility] and prefers [place].  Patient/Family's Understanding of and Emotional Response to Diagnosis, Current Treatment, and Prognosis: Patient was pleasant and thanked social work Theatre manager for her assistance.  Emotional Assessment Appearance:    Attitude/Demeanor/Rapport:    Affect (typically observed):  Accepting, Calm, Pleasant Orientation:  Oriented to Self, Oriented to Place, Oriented to  Time, Oriented to Situation Alcohol / Substance use:  Not Applicable Psych involvement (Current and /or in the community):     Discharge Needs  Concerns to be addressed:  Care Coordination, Discharge Planning Concerns Readmission within the last 30 days:  No Current discharge risk:  Dependent with Mobility Barriers to Discharge:  Continued Medical Work up   Smith Mince, Student-Social Work 05/07/2017, 8:44 AM

## 2017-05-07 NOTE — Anesthesia Postprocedure Evaluation (Signed)
Anesthesia Post Note  Patient: Darrell Clark  Procedure(s) Performed: TOTAL HIP ARTHROPLASTY (Left Hip)  Patient location during evaluation: Nursing Unit Anesthesia Type: Spinal Level of consciousness: awake and alert and oriented Pain management: pain level controlled Vital Signs Assessment: post-procedure vital signs reviewed and stable Respiratory status: respiratory function stable Cardiovascular status: stable Postop Assessment: no headache, no backache, spinal receding, patient able to bend at knees, no apparent nausea or vomiting and adequate PO intake Anesthetic complications: no     Last Vitals:  Vitals:   05/07/17 0009 05/07/17 0506  BP: 130/68 140/61  Pulse: 86 80  Resp:    Temp:    SpO2: 99% 98%    Last Pain:  Vitals:   05/07/17 0500  TempSrc:   PainSc: [name]

## 2017-05-07 NOTE — Progress Notes (Addendum)
OT Cancellation Note  Patient Details Name: Darrell Clark MRN: [id number] DOB: April 12, 1933   Cancelled Treatment:    Reason Eval/Treat Not Completed: Patient at procedure or test/ unavailable(Pt. with PT. Will continue to monitor, OT to eval at a later time.)  Harrel Carina, MS, OTR/L 05/07/2017, 9:56 AM

## 2017-05-07 NOTE — Evaluation (Addendum)
Occupational Therapy Evaluation Patient Details Name: Darrell Clark MRN: [id number] DOB: 1933-04-11 Today's Date: 05/07/2017    History of Present Illness Pt. is an 83 y.o. male who was admitted to [facility] for a Left THR, posterior approach. Pt. PMHx includes: CVA, Anxiety, depression, CAD, mild Dementia, DM, and spinal stenosis.   Clinical Impression   Pt. Is an 83 y.o. male who was admitted to [facility] for a Left THR. Pt. Has Posterior hip precautions, weakness, limited functional mobility, lethargy,  pain, and decreased activity tolerance which hinder his ability to complete ADL, and IADL tasks. Pt. was fatigued during session, and intermittently closes eyes during session. Pt. Education was provided about A/E use for LE ADLs, and posterior hip precautions. Pt. Resides at home alone. Pt. Has family, however they work. Pt.  Has home health aides who assist with ADLs, and IADLs. Pt. son assists with medication management. Pt. performed limited driving to pick up daily take out meals. Pt. Would beneift from skilled OT services for ADLtraining, A/E training, and pt. Education about work simplification strategies, posterior hip precautions, home modification, and DME. Pt. Would beneift from SNF level of care upon discharge with follow up OT services.    Follow Up Recommendations  SNF    Equipment Recommendations       Recommendations for Other Services       Precautions / Restrictions Precautions Precautions: Fall;Posterior Hip Precaution Booklet Issued: Yes (comment) Precaution Comments: Pt with difficulty understanding precautions Restrictions Weight Bearing Restrictions: Yes LLE Weight Bearing: Weight bearing as tolerated             ADL either performed or assessed with clinical judgement   ADL Overall ADL's : Needs assistance/impaired Eating/Feeding: Independent;Set up   Grooming: Set up   Upper Body Bathing: Set up;Minimal assistance   Lower Body Bathing: Maximal  assistance   Upper Body Dressing : Minimal assistance   Lower Body Dressing: Maximal assistance               Functional mobility during ADLs: Minimal assistance General ADL Comments: Pt. education was provided about A/E use for LE ADLs, and posterior hip precautions.     Vision         Perception     Praxis      Pertinent Vitals/Pain Pain Assessment: 0-10 Pain Score: 5  Pain Location: L hip/L knee Pain Descriptors / Indicators: Operative site guarding Pain Intervention(s): Monitored during session;Premedicated before session     Hand Dominance Right   Extremity/Trunk Assessment Upper Extremity Assessment Upper Extremity Assessment: Overall WFL for tasks assessed           Communication Communication Communication: No difficulties   Cognition Arousal/Alertness: Awake/alert Behavior During Therapy: Flat affect Overall Cognitive Status: Within Functional Limits for tasks assessed                                     General Comments       Exercises   Shoulder Instructions     Home Living Family/patient expects to be discharged to:: Private residence Living Arrangements: Alone Available Help at Discharge: Family;Other (Comment) Type of Home: House Home Access: Ramped entrance     Home Layout: One level     Bathroom Shower/Tub: Teacher, early years/pre: Standard     Home Equipment: Cane - single point;Grab bars - tub/shower;Walker - 4 wheels;Shower seat;Bedside commode;Wheelchair - manual  Prior Functioning/Environment Level of Independence: Needs assistance    ADL's / Homemaking Assistance Needed: Pt. has home health aide, and family assist with ADLs, and IADLs. Pt. son assists pt. with medication management. Pt. reports being independently able to drive, however was limited to only driving to  pick up take out for his meals.             OT Problem List: Decreased strength;Decreased activity  tolerance;Impaired UE functional use;Decreased knowledge of use of DME or AE;Pain      OT Treatment/Interventions: Self-care/ADL training;Patient/family education;Therapeutic exercise;Therapeutic activities;DME and/or AE instruction    OT Goals(Current goals can be found in the care plan section) Acute Rehab OT Goals Patient Stated Goal: To return home OT Goal Formulation: With patient Potential to Achieve Goals: Good  OT Frequency: Min 2X/week   Barriers to D/C:            Co-evaluation              AM-PAC PT "6 Clicks" Daily Activity     Outcome Measure Help from another person eating meals?: None Help from another person taking care of personal grooming?: A Little Help from another person toileting, which includes using toliet, bedpan, or urinal?: A Lot Help from another person bathing (including washing, rinsing, drying)?: A Lot Help from another person to put on and taking off regular upper body clothing?: A Little Help from another person to put on and taking off regular lower body clothing?: A Lot 6 Click Score: 16   End of Session    Activity Tolerance: Patient tolerated treatment well Patient left: in bed  OT Visit Diagnosis: Muscle weakness (generalized) (M62.81)                Time: 5329-9242 OT Time Calculation (min): 21 min Charges:  OT General Charges $OT Visit: 1 Visit OT Evaluation $OT Eval Moderate Complexity: 1 Mod G-Codes: OT G-codes **NOT FOR INPATIENT CLASS** Functional Limitation: Self care Self Care Current Status (A8341): At least 60 percent but less than 80 percent impaired, limited or restricted Self Care Goal Status (D6222): At least 1 percent but less than 20 percent impaired, limited or restricted   Harrel Carina, MS, OTR/L   Harrel Carina, MS, OTR/L 05/07/2017, 11:42 AM

## 2017-05-07 NOTE — Progress Notes (Signed)
POD 1. Pt. Has been confused and agitated. Trying to get out of bed for a good part of the night. Ativan PO administered to help control his agitation. PO pain meds given trying to control pain level. Pts pain score has been high throughout the night. Pt. Dangled to the bedside and didn't help much at all with the transfer. Neurochecks WDL. Foot pumps put back on pt. Multiple times and pt. Pulling at his foley and hemovac. Resting quietly at this time after receiving ativan. Will continue to monitor.

## 2017-05-07 NOTE — NC FL2 (Signed)
[place] LEVEL OF CARE SCREENING TOOL     IDENTIFICATION  Patient Name: Darrell Clark Birthdate: 1933/04/30 Sex: male Admission Date (Current Location): 05/06/2017  [place] and [place] Number:  Engineering geologist and Address:  [facility], [address] [place], [place] [address]      Provider Number: [id number]  Attending Physician Name and Address:  Dereck Leep, MD  Relative Name and Phone Number:       Current Level of Care: Hospital Recommended Level of Care: [place] Prior Approval Number:    Date Approved/Denied:   PASRR Number: [id number])  Discharge Plan: SNF    Current Diagnoses: Patient Active Problem List   Diagnosis Date Noted  . Status post total replacement of hip 05/06/2017  . Lumbar radiculopathy, acute 01/01/2017  . Generalized weakness 01/01/2017  . Current moderate episode of major depressive disorder without prior episode ([place]) 12/18/2016  . Dizziness 12/13/2016  . Inguinal hernia of left side without obstruction or gangrene 09/13/2016  . SOBOE (shortness of breath on exertion) 08/03/2016  . Bilateral carotid artery stenosis 08/03/2016  . Vision, loss, sudden 04/16/2016  . 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 Intracranial vascular stenosis 04/16/2016  . CVA (cerebral vascular accident) ([place]) 04/15/2016  . HTN (hypertension) 04/15/2016  . Diabetes ([place]) 04/15/2016  . GERD (gastroesophageal reflux disease) 04/15/2016  . HLD (hyperlipidemia) 04/15/2016  . Primary osteoarthritis of both hips 09/24/2015  . Moderate tricuspid insufficiency 01/10/2015  . Moderate mitral insufficiency 08/31/2014  . Mild aortic valve stenosis 08/31/2014  . CAD (coronary artery disease) 08/17/2014  . Spermatocele 04/21/2012  . Sciatica 04/21/2012  . Personal history of prostate cancer 04/21/2012  . Increased frequency of urination 04/21/2012  .  Incomplete emptying of bladder 04/21/2012  . Encysted hydrocele 04/21/2012  . ED (erectile dysfunction) of organic origin 04/21/2012    Orientation RESPIRATION BLADDER Height & Weight     Self, Time, Situation, Place  Normal Continent Weight: 225 lb (102.1 kg) Height:  5\' 10"  (177.8 cm)  BEHAVIORAL SYMPTOMS/MOOD NEUROLOGICAL BOWEL NUTRITION STATUS      Continent Diet(Carb Modified)  AMBULATORY STATUS COMMUNICATION OF NEEDS Skin   Extensive Assist Verbally Surgical wounds(Incision Left Hip)                       Personal Care Assistance Level of Assistance  Bathing, Feeding, Dressing Bathing Assistance: Limited assistance Feeding assistance: Independent Dressing Assistance: Limited assistance     Functional Limitations Info  Sight, Hearing, Speech Sight Info: Adequate Hearing Info: Adequate Speech Info: Adequate    SPECIAL CARE FACTORS FREQUENCY  PT (By licensed PT), OT (By licensed OT)     PT Frequency: (5) OT Frequency: (5)            Contractures      Additional Factors Info  Code Status, Allergies Code Status Info: (Full Code) Allergies Info: (NIACIN, NIACIN AND RELATED )           Current Medications (05/07/2017):  This is the current hospital active medication list Current Facility-Administered Medications  Medication Dose Route Frequency Provider Last Rate Last Dose  . 0.9 %  sodium chloride infusion   Intravenous Continuous Hooten, Laurice Record, MD 100 mL/hr at 05/07/17 0005    . acetaminophen (TYLENOL) tablet 650 mg  650 mg Oral Q4H PRN Hooten, Laurice Record, MD       Or  . acetaminophen (  TYLENOL) suppository 650 mg  650 mg Rectal Q4H PRN Hooten, Laurice Record, MD      . alum & mag hydroxide-simeth (MAALOX/MYLANTA) 200-200-20 MG/5ML suspension 30 mL  30 mL Oral Q4H PRN Hooten, Laurice Record, MD      . amLODipine (NORVASC) tablet 5 mg  5 mg Oral Daily Hooten, Laurice Record, MD      . aspirin EC tablet 81 mg  81 mg Oral Daily Hooten, Laurice Record, MD   81 mg at 05/06/17 1411  .  benazepril (LOTENSIN) tablet 20 mg  20 mg Oral Daily Hooten, Laurice Record, MD   20 mg at 05/06/17 1416  . bisacodyl (DULCOLAX) suppository 10 mg  10 mg Rectal Daily PRN Hooten, Laurice Record, MD      . celecoxib (CELEBREX) capsule 200 mg  200 mg Oral Q12H Hooten, Laurice Record, MD   200 mg at 05/06/17 2032  . diphenhydrAMINE (BENADRYL) 12.5 MG/5ML elixir 12.5-25 mg  12.5-25 mg Oral Q4H PRN Hooten, Laurice Record, MD      . donepezil (ARICEPT) tablet 10 mg  10 mg Oral QHS Hooten, Laurice Record, MD   10 mg at 05/06/17 2032  . enoxaparin (LOVENOX) injection 30 mg  30 mg Subcutaneous Q12H Hooten, Laurice Record, MD      . escitalopram (LEXAPRO) tablet 10 mg  10 mg Oral Daily Hooten, Laurice Record, MD   10 mg at 05/06/17 1412  . ferrous sulfate tablet 325 mg  325 mg Oral BID WC Dereck Leep, MD   325 mg at 05/06/17 2032  . fluticasone (FLONASE) 50 MCG/ACT nasal spray 2 spray  2 spray Each Nare Daily Hooten, Laurice Record, MD      . glipiZIDE (GLUCOTROL XL) 24 hr tablet 2.5 mg  2.5 mg Oral Q breakfast Hooten, Laurice Record, MD      . insulin aspart (novoLOG) injection 0-15 Units  0-15 Units Subcutaneous TID WC Hooten, Laurice Record, MD   3 Units at 05/06/17 1745  . latanoprost (XALATAN) 0.005 % ophthalmic solution 1 drop  1 drop Right Eye QHS Hooten, Laurice Record, MD   1 drop at 05/06/17 2031  . LORazepam (ATIVAN) tablet 0.5 mg  0.5 mg Oral Q8H PRN Hooten, Laurice Record, MD   0.5 mg at 05/07/17 0400  . magnesium hydroxide (MILK OF MAGNESIA) suspension 30 mL  30 mL Oral Daily PRN Hooten, Laurice Record, MD      . menthol-cetylpyridinium (CEPACOL) lozenge 3 mg  1 lozenge Oral PRN Hooten, Laurice Record, MD       Or  . phenol (CHLORASEPTIC) mouth spray 1 spray  1 spray Mouth/Throat PRN Hooten, Laurice Record, MD      . metoCLOPramide (REGLAN) tablet 10 mg  10 mg Oral TID AC & HS Hooten, Laurice Record, MD   10 mg at 05/06/17 2032  . montelukast (SINGULAIR) tablet 10 mg  10 mg Oral Daily Hooten, Laurice Record, MD      . morphine 2 MG/ML injection 2 mg  2 mg Intravenous Q2H PRN Hooten, Laurice Record, MD   2 mg at  05/06/17 1652  . nitroGLYCERIN (NITROSTAT) SL tablet 0.4 mg  0.4 mg Sublingual Q5 min PRN Hooten, Laurice Record, MD      . ondansetron (ZOFRAN) tablet 4 mg  4 mg Oral Q6H PRN Hooten, Laurice Record, MD       Or  . ondansetron (ZOFRAN) injection 4 mg  4 mg Intravenous Q6H PRN Hooten, Laurice Record, MD   4 mg  at 05/06/17 1651  . oxyCODONE (Oxy IR/ROXICODONE) immediate release tablet 10 mg  10 mg Oral Q3H PRN Hooten, Laurice Record, MD   10 mg at 05/07/17 0400  . oxyCODONE (Oxy IR/ROXICODONE) immediate release tablet 5 mg  5 mg Oral Q3H PRN Hooten, Laurice Record, MD      . pantoprazole (PROTONIX) EC tablet 40 mg  40 mg Oral BID Dereck Leep, MD   40 mg at 05/06/17 2032  . polyvinyl alcohol (LIQUIFILM TEARS) 1.4 % ophthalmic solution 1 drop  1 drop Both Eyes BID Hooten, Laurice Record, MD   1 drop at 05/06/17 2031  . senna-docusate (Senokot-S) tablet 1 tablet  1 tablet Oral BID Dereck Leep, MD   1 tablet at 05/06/17 2032  . simvastatin (ZOCOR) tablet 20 mg  20 mg Oral Daily Hooten, Laurice Record, MD      . sodium phosphate (FLEET) 7-19 GM/118ML enema 1 enema  1 enema Rectal Once PRN Hooten, Laurice Record, MD      . traMADol Veatrice [place]) tablet 50-100 mg  50-100 mg Oral Q4H PRN Dereck Leep, MD   100 mg at 05/07/17 0005     Discharge Medications: Please see discharge summary for a list of discharge medications.  Relevant Imaging Results:  Relevant Lab Results:   Additional Information (SSN: [id number])  Smith Mince, Student-Social Work

## 2017-05-07 NOTE — Progress Notes (Signed)
Physical Therapy Treatment Patient Details Name: Darrell Clark MRN: [id number] DOB: [birth year] Today's Date: 05/07/2017    History of Present Illness Pt underwent L THR posterior approach without reported post-op complications. He is POD#0 at time of PT evaluation. PMH includes CVA, anxiety, depression, CAD, mild dementia, DM, and spinal stenosis    PT Comments    Pt remains very limited in his mobility at this time due to weakness and pain. Pt requires modA+1 with heavy cues for sequencing during supine to sit. HOB elevated, use of bed rails, and increased time required to perform. Pt with less increase in pain with bed mobility today compared to evaluation. He is requiring maxA+2 for sit to stand transfers. Continues to require cues for safe hand placement and sequencing. Poor weight acceptance to LLE and decreased RLE noted during transfer. He is only able to take short shuffling steps from bed to recliner with rolling walker and modA+2. LLE buckling noted and heavy UE support. Considerable fatigue and increase in pain. Pt is unsafe to attempt further ambulation at this time. He is able to complete all supine and seated exercises with assistance from therapist as instructed. Pt will need SNF placement at discharge. Pt will benefit from PT services to address deficits in strength, balance, and mobility in order to return to full function at home after SNF placement.    Follow Up Recommendations  SNF     Equipment Recommendations  None recommended by PT;Other (comment)(TBD further at [facility])    Recommendations for Other Services OT consult     Precautions / Restrictions Precautions Precautions: Fall;Posterior Hip Precaution Booklet Issued: Yes (comment) Precaution Comments: Pt with difficulty understanding precautions Restrictions Weight Bearing Restrictions: Yes LLE Weight Bearing: Weight bearing as tolerated    Mobility  Bed Mobility Overal bed mobility: Needs Assistance Bed  Mobility: Supine to Sit;Sit to Supine     Supine to sit: Mod assist     General bed mobility comments: Pt requires modA+1 with heavy cues for sequencing during supine to sit. HOB elevated, use of bed rails, and increased time required to perform. Pt with less increase in pain with bed mobility today compared to evaluation  Transfers Overall transfer level: Needs assistance Equipment used: Rolling walker (2 wheeled) Transfers: Sit to/from Stand Sit to Stand: Max assist;+2 physical assistance         General transfer comment: Pt requiring maxA+2 for sit to stand transfers. Continues to require cues for safe hand placement and sequencing. Poor weight acceptance to LLE and decreased RLE noted during transfer. Pt requiring continual UE support on walker once upright in standing  Ambulation/Gait Ambulation/Gait assistance: Mod assist;+2 physical assistance Ambulation Distance (Feet): 3 Feet Assistive device: Rolling walker (2 wheeled) Gait Pattern/deviations: Decreased step length - left;Decreased step length - right;Decreased weight shift to left Gait velocity: Decreased Gait velocity interpretation: <1.8 ft/sec, indicative of risk for recurrent falls General Gait Details: Pt is able to take short shuffling steps from bed to recliner with rolling walker and modA+2. LLE buckling noted and heavy UE support. Considerable fatigue and increase in pain. Pt is unsafe to attempt further ambulation at this time   Stairs            Wheelchair Mobility    Modified Rankin (Stroke Patients Only)       Balance Overall balance assessment: Needs assistance Sitting-balance support: No upper extremity supported Sitting balance-Leahy Scale: Good     Standing balance support: Bilateral upper extremity supported Standing balance-Leahy Scale:  Fair Standing balance comment: Requires UE support on rolling walker for ambulation                            Cognition  Arousal/Alertness: Awake/alert Behavior During Therapy: Flat affect Overall Cognitive Status: Within Functional Limits for tasks assessed                                        Exercises Total Joint Exercises Ankle Circles/Pumps: AROM;Both;Supine;15 reps Quad Sets: Strengthening;Both;Supine;15 reps Gluteal Sets: Strengthening;Both;Supine;15 reps Towel Squeeze: Strengthening;Both;Supine;15 reps Short Arc Quad: Strengthening;Left;Supine;15 reps Heel Slides: Strengthening;Left;Supine;15 reps Hip ABduction/ADduction: Strengthening;Left;Supine;15 reps;Other (comment)(x 15 in sitting each as well) Straight Leg Raises: Strengthening;Left;Supine;15 reps Long Arc Quad: Strengthening;Left;15 reps;Seated Knee Flexion: Strengthening;Left;15 reps;Seated Marching in Standing: Strengthening;Both;15 reps;Seated    General Comments        Pertinent Vitals/Pain Pain Assessment: 0-10 Pain Score: 5  Pain Location: L hip/L knee Pain Descriptors / Indicators: Operative site guarding    Home Living                      Prior Function            PT Goals (current goals can now be found in the care plan section) Acute Rehab PT Goals Patient Stated Goal: Return to prior function at home PT Goal Formulation: With patient Time For Goal Achievement: 05/20/17 Potential to Achieve Goals: Good Progress towards PT goals: Progressing toward goals    Frequency    BID      PT Plan Current plan remains appropriate    Co-evaluation              AM-PAC PT "6 Clicks" Daily Activity  Outcome Measure  Difficulty turning over in bed (including adjusting bedclothes, sheets and blankets)?: Unable Difficulty moving from lying on back to sitting on the side of the bed? : Unable Difficulty sitting down on and standing up from a chair with arms (e.g., wheelchair, bedside commode, etc,.)?: Unable Help needed moving to and from a bed to chair (including a wheelchair)?: A  Lot Help needed walking in hospital room?: Total Help needed climbing 3-5 steps with a railing? : Total 6 Click Score: 7    End of Session Equipment Utilized During Treatment: Gait belt Activity Tolerance: Patient tolerated treatment well Patient left: with call bell/phone within reach;in chair;with chair alarm set;Other (comment)(abduction pillow between knees, ice pack on hip) Nurse Communication: Mobility status PT Visit Diagnosis: Unsteadiness on feet (R26.81);Other abnormalities of gait and mobility (R26.89);Muscle weakness (generalized) (M62.81);Pain Pain - Right/Left: Left Pain - part of body: Hip     Time: 3007-6226 PT Time Calculation (min) (ACUTE ONLY): 25 min  Charges:  $Gait Training: 8-22 mins $Therapeutic Exercise: 8-22 mins                    G Codes:       Lyndel Safe Tiyon Sanor PT, DPT     Wylee Dorantes 05/07/2017, 11:10 AM

## 2017-05-08 LAB — HEMOGLOBIN AND HEMATOCRIT, BLOOD
HEMATOCRIT: 35.6 % — AB (ref 40.0–52.0)
HEMOGLOBIN: 12 g/dL — AB (ref 13.0–18.0)

## 2017-05-08 LAB — GLUCOSE, CAPILLARY
GLUCOSE-CAPILLARY: 129 mg/dL — AB (ref 65–99)
Glucose-Capillary: 128 mg/dL — ABNORMAL HIGH (ref 65–99)

## 2017-05-08 LAB — SURGICAL PATHOLOGY

## 2017-05-08 MED ORDER — CLOPIDOGREL BISULFATE 75 MG PO TABS: 75.0000 mg | ORAL_TABLET | Freq: Every day | ORAL | 1 refills | Status: DC

## 2017-05-08 MED ORDER — ENOXAPARIN SODIUM 40 MG/0.4ML ~~LOC~~ SOLN: 40.0000 mg | SUBCUTANEOUS | 0 refills | Status: DC

## 2017-05-08 MED ORDER — HYDROCODONE-ACETAMINOPHEN 5-325 MG PO TABS: 1.0000 | ORAL_TABLET | ORAL | 0 refills | Status: DC | PRN

## 2017-05-08 MED ORDER — SODIUM CHLORIDE 0.9 % IV BOLUS (SEPSIS)
500.0000 mL | Freq: Once | INTRAVENOUS | Status: AC
  Administered 2017-05-08: 500 mL via INTRAVENOUS

## 2017-05-08 MED ORDER — OXYCODONE HCL 5 MG PO TABS: 5.0000 mg | ORAL_TABLET | ORAL | 0 refills | Status: DC | PRN

## 2017-05-08 MED ORDER — TRAMADOL HCL 50 MG PO TABS: 50.0000 mg | ORAL_TABLET | ORAL | 0 refills | Status: DC | PRN

## 2017-05-08 NOTE — Progress Notes (Signed)
EMS called for transport.

## 2017-05-08 NOTE — Discharge Summary (Signed)
Physician Discharge Summary  Subjective: 2 Days Post-Op Procedure(s) (LRB): TOTAL HIP ARTHROPLASTY (Left) Patient reports pain as 3 on 0-10 scale.   Patient seen in rounds with Dr. Marry Guan. Patient is well, and has had no acute complaints or problems.  The patient had hypotension this AM, with resolve using Bolus fluids.  Improved cognition and BP.   Patient is ready to go to rehabilitation for physical therapy  Physician Discharge Summary  Patient ID: Darrell Clark MRN: [id number] DOB/AGE: March 06, 1933 83 y.o.  Admit date: 05/06/2017 Discharge date: 05/08/2017  Admission Diagnoses:  Discharge Diagnoses:  Active Problems:   Status post total replacement of hip   Discharged Condition: fair  Hospital Course: The patient is postop day 2 from a left total hip replacement. He is doing well since surgery. The patient has stable vitals. He has been ambulating in the room with physical therapy. He had some confusion yesterday but is improved today. Still working on having a bowel movement.  The patient had hypotension this AM.  500 cc Bolus was given with good response.  HgB was 12.0.  Improved and ready for discharge.  Treatments: surgery:  Left total hip arthroplasty  SURGEON:  Marciano Sequin. M.D.  ASSISTANT:  Vance Peper, PA (present and scrubbed throughout the case, critical for assistance with exposure, retraction, instrumentation, and closure)  ANESTHESIA: spinal  ESTIMATED BLOOD LOSS: 700 mL  FLUIDS REPLACED: 1600 mL of crystalloid  DRAINS: 2 medium drains to a Hemovac reservoir  IMPLANTS UTILIZED: DePuy 13.5 mm small stature AML femoral stem, 54 mm OD Pinnacle 100 acetabular component, neutral Pinnacle [place] polyethylene insert, and a 36 mm M-SPEC =2 mm hip ball    Discharge Exam: Blood pressure 107/66, pulse 83, temperature 98.3 F (36.8 C), temperature source Oral, resp. rate 19, height 5\' 10"  (1.778 m), weight 102.1 kg (225 lb), SpO2 99  %.   Disposition: 01-Home or Self Care   Allergies as of 05/08/2017      Reactions   Niacin Itching, Rash   Other reaction(s): Unknown Other reaction(s): UNKNOWN   Niacin And Related Rash      Medication List    TAKE these medications   acetaminophen 500 MG tablet Commonly known as:  TYLENOL Take 1,000 mg by mouth 2 (two) times daily.   amLODipine 5 MG tablet Commonly known as:  NORVASC Take 5 mg by mouth daily.   aspirin 81 MG EC tablet Take 1 tablet (81 mg total) by mouth daily.   benazepril 20 MG tablet Commonly known as:  LOTENSIN Take 20 mg by mouth daily.   clopidogrel 75 MG tablet Commonly known as:  PLAVIX Take 1 tablet (75 mg total) by mouth daily. Do not resume, until Lovenox is discontinued in 14 days. What changed:  additional instructions   docusate sodium 100 MG capsule Commonly known as:  COLACE Take 100 mg by mouth 2 (two) times daily as needed. As needed.   donepezil 10 MG tablet Commonly known as:  ARICEPT Take 10 mg by mouth at bedtime.   enoxaparin 40 MG/0.4ML injection Commonly known as:  LOVENOX Inject 0.4 mLs (40 mg total) into the skin daily.   escitalopram 10 MG tablet Commonly known as:  LEXAPRO Take 10 mg by mouth daily.   fluticasone 50 MCG/ACT nasal spray Commonly known as:  FLONASE Place 2 sprays into both nostrils daily.   glipiZIDE 2.5 MG 24 hr tablet Commonly known as:  GLUCOTROL XL Take 1 tablet (2.5 mg total) by  mouth daily with breakfast.   HYDROcodone-acetaminophen 5-325 MG tablet Commonly known as:  NORCO/VICODIN Take 1 tablet by mouth every 4 (four) hours as needed for moderate pain.   latanoprost 0.005 % ophthalmic solution Commonly known as:  XALATAN Place 1 drop into the right eye at bedtime.   LORazepam 0.5 MG tablet Commonly known as:  ATIVAN Take 1 tablet (0.5 mg total) by mouth every 8 (eight) hours. What changed:    when to take this  reasons to take this   meclizine 12.5 MG tablet Commonly  known as:  ANTIVERT Take 1 tablet (12.5 mg total) by mouth 3 (three) times daily as needed for dizziness or nausea.   montelukast 10 MG tablet Commonly known as:  SINGULAIR Take 10 mg by mouth daily.   nitroGLYCERIN 0.4 MG SL tablet Commonly known as:  NITROSTAT Place 0.4 mg under the tongue every 5 (five) minutes as needed. For chest pain. Give up to 3 doses every 5 minutes as needed   OSTEO BI-FLEX TRIPLE STRENGTH Tabs Take 1 tablet by mouth 2 (two) times daily.   pantoprazole 40 MG tablet Commonly known as:  PROTONIX Take 40 mg by mouth daily.   simvastatin 20 MG tablet Commonly known as:  ZOCOR Take 20 mg by mouth daily.   SYSTANE 0.4-0.3 % Soln Generic drug:  Polyethyl Glycol-Propyl Glycol Place 1 drop into both eyes 2 (two) times daily.            Durable Medical Equipment  (From admission, onward)        Start     Ordered   05/06/17 1310  DME Walker rolling  Once    Question:  Patient needs a walker to treat with the following condition  Answer:  S/P total hip arthroplasty   05/06/17 1309   05/06/17 1310  DME Bedside commode  Once    Question:  Patient needs a bedside commode to treat with the following condition  Answer:  S/P total hip arthroplasty   05/06/17 1309      Contact information for follow-up providers    Dereck Leep, MD On 06/20/2017.   Specialty:  Orthopedic Surgery Why:  at 2:00pm Contact information: [address] [facility] [place] [address] [phone]            Contact information for after-discharge care    Destination    [facility] Follow up.   Service:  Skilled Nursing Contact information: [address] [place] [phone]                  Signed: Prescott Parma, Andora Krull 05/08/2017, 11:20 AM   Objective: Vital signs in last 24 hours: Temp:  [98.3 F (36.8 C)-98.6 F (37 C)] 98.3 F (36.8 C) (11/20 1943) Pulse Rate:  [66-83] 83 (11/21 1012) Resp:   [19] 19 (11/20 1943) BP: (100-157)/(52-112) 107/66 (11/21 1012) SpO2:  [93 %-100 %] 99 % (11/21 1012)  Intake/Output from previous day:  Intake/Output Summary (Last 24 hours) at 05/08/2017 1120 Last data filed at 05/08/2017 0600 Gross per 24 hour  Intake 600 ml  Output 780 ml  Net -180 ml    Intake/Output this shift: No intake/output data recorded.  Labs: Recent Labs    05/08/17 0926  HGB 12.0*   Recent Labs    05/08/17 0926  HCT 35.6*   No results for input(s): NA, K, CL, CO2, BUN, CREATININE, GLUCOSE, CALCIUM in the last 72 hours. No results for  input(s): LABPT, INR in the last 72 hours.  EXAM: General - Patient is Alert and Oriented Extremity - Sensation intact distally Dorsiflexion/Plantar flexion intact Compartment soft Incision - clean, dry, no drainage, with the Hemovac removed Motor Function -  plantarflexion and dorsiflexion are intact.  Assessment/Plan: 2 Days Post-Op Procedure(s) (LRB): TOTAL HIP ARTHROPLASTY (Left) Procedure(s) (LRB): TOTAL HIP ARTHROPLASTY (Left) Past Medical History:  Diagnosis Date  . Acquired cyst of kidney   . Allergy   . Anxiety   . Arthritis   . Atherosclerosis   . BPH (benign prostatic hyperplasia)   . CAD (coronary artery disease)   . Cardiac arrhythmia   . Chronic prostatitis   . Degenerative arthritis   . Dementia    mild  . Depression   . Diabetes mellitus without complication ([place])   . Dyspnea    with exertion  . Elevated prostate specific antigen (PSA)   . Encysted hydrocele   . GERD (gastroesophageal reflux disease)   . Glaucoma (increased eye pressure)   . Gout   . Gross hematuria   . HLD (hyperlipidemia)   . Hyperlipidemia, unspecified   . Hypertension   . Hypertrophy of breast   . Impotence of organic origin   . Incomplete bladder emptying   . Malignant neoplasm of prostate ([place])   . Other specified disorders of the male genital organs   . Prostate cancer ([place])   . PVD (peripheral vascular  disease) ([place])   . Sciatica   . Spermatocele   . Spinal stenosis of lumbar region with radiculopathy 11/11/2015  . Stroke ([place])   . Testicular hypofunction    other  . Urinary frequency   . Urinary obstruction    not elsewhere classified  . Vertigo    Active Problems:   Status post total replacement of hip  Estimated body mass index is 32.28 kg/m as calculated from the following:   Height as of this encounter: 5\' 10"  (1.778 m).   Weight as of this encounter: 102.1 kg (225 lb). Up with therapy Discharge to SNF Diet - Regular diet Follow up - in 6 weeks Activity - WBAT Disposition - Rehab Condition Upon Discharge - Stable DVT Prophylaxis - Lovenox and support stockings  Reche Dixon, PA-C Orthopaedic Surgery 05/08/2017, 11:20 AM

## 2017-05-08 NOTE — Progress Notes (Addendum)
Patient is medically stable for D/C to [facility] today. Per [facility] admissions coordinator at [facility] patient can come today. RN will call report and arrange EMS for transport. Clinical Education officer, museum (CSW) sent D/C orders to [employer] via [employer]. Patient is aware of above. CSW left patient's son Gerald Stabs, daughter Alleen Borne and niece Tye [place] voicemails. Please reconsult if future social work needs arise. CSW signing off.   Patient's son Gerald Stabs called CSW back and was made aware of above.   [employer], LCSW [phone]

## 2017-05-08 NOTE — Progress Notes (Signed)
   Subjective: 2 Days Post-Op Procedure(s) (LRB): TOTAL HIP ARTHROPLASTY (Left) Patient reports pain as 3 on 0-10 scale.  Pain is improving. Patient is Currently well with no complaints. Denies any CP, SOB, ABD pain, nausea, vomiting. We will continue therapy today.  Plan is to go Skilled nursing facility after hospital stay.  Objective: Vital signs in last 24 hours: Temp:  [98.3 F (36.8 C)-98.7 F (37.1 C)] 98.3 F (36.8 C) (11/20 1943) Pulse Rate:  [75-81] 81 (11/20 1943) Resp:  [18-19] 19 (11/20 1943) BP: (109-126)/(64-71) 126/67 (11/20 1943) SpO2:  [93 %-96 %] 94 % (11/20 1943)  Intake/Output from previous day: 11/20 0701 - 11/21 0700 In: 1300 [P.O.:840; I.V.:460] Out: 980 [Urine:750; Drains:230] Intake/Output this shift: Total I/O In: -  Out: 780 [Urine:550; Drains:230]  No results for input(s): HGB in the last 72 hours. No results for input(s): WBC, RBC, HCT, PLT in the last 72 hours. No results for input(s): NA, K, CL, CO2, BUN, CREATININE, GLUCOSE, CALCIUM in the last 72 hours. No results for input(s): LABPT, INR in the last 72 hours.  EXAM General - Patient is Alert, Appropriate and Oriented Extremity - Neurovascular intact Sensation intact distally Intact pulses distally Dorsiflexion/Plantar flexion intact No cellulitis present Compartment soft Dressing - clean, dry, and intact. The Hemovac was removed with no complication. Motor Function - intact, moving foot and toes well on exam.   Past Medical History:  Diagnosis Date  . Acquired cyst of kidney   . Allergy   . Anxiety   . Arthritis   . Atherosclerosis   . BPH (benign prostatic hyperplasia)   . CAD (coronary artery disease)   . Cardiac arrhythmia   . Chronic prostatitis   . Degenerative arthritis   . Dementia    mild  . Depression   . Diabetes mellitus without complication ([place])   . Dyspnea    with exertion  . Elevated prostate specific antigen (PSA)   . Encysted hydrocele   . GERD  (gastroesophageal reflux disease)   . Glaucoma (increased eye pressure)   . Gout   . Gross hematuria   . HLD (hyperlipidemia)   . Hyperlipidemia, unspecified   . Hypertension   . Hypertrophy of breast   . Impotence of organic origin   . Incomplete bladder emptying   . Malignant neoplasm of prostate ([place])   . Other specified disorders of the male genital organs   . Prostate cancer ([place])   . PVD (peripheral vascular disease) ([place])   . Sciatica   . Spermatocele   . Spinal stenosis of lumbar region with radiculopathy 11/11/2015  . Stroke ([place])   . Testicular hypofunction    other  . Urinary frequency   . Urinary obstruction    not elsewhere classified  . Vertigo     Assessment/Plan:   2 Days Post-Op Procedure(s) (LRB): TOTAL HIP ARTHROPLASTY (Left) Active Problems:   Status post total replacement of hip  Estimated body mass index is 32.28 kg/m as calculated from the following:   Height as of this encounter: 5\' 10"  (1.778 m).   Weight as of this encounter: 102.1 kg (225 lb). Advance diet Up with therapy  Needs bowel movement, continue with bowel regimen medications Pain well controlled this morning. Vital signs are stable. Care management to assist with discharge to skilled nursing facility today.  DVT Prophylaxis - Lovenox, Foot Pumps and TED hose Weight-Bearing as tolerated to left lower extremity  Reche Dixon, PA-C [place] 05/08/2017, 6:42 AM

## 2017-05-08 NOTE — Progress Notes (Signed)
Post bolus BP 100/54, pt is having some apnea, O2 100% Ra. Dr Marry Guan aware, new order for stat H&H

## 2017-05-08 NOTE — Progress Notes (Signed)
POD 2. Pt. Confused and agitated at times tonight. Pain controlled with PO pain meds and Ativan given for agitation. Neurochecks WDL. Using urinal and still awaiting a BM. Will continue to monitor

## 2017-05-08 NOTE — Progress Notes (Signed)
Physical Therapy Treatment Patient Details Name: Darrell Clark MRN: [id number] DOB: Aug 25, 1932 Today's Date: 05/08/2017    History of Present Illness Pt. is an 83 y.o. male who was admitted to [facility] for a Left THR, posterior approach. Pt. PMHx includes: CVA, Anxiety, depression, CAD, mild Dementia, DM, and spinal stenosis.    PT Comments    Patient continues to be limited in progress with therapy. He is able to transfer from recliner to bed and complete seated exercises as instructed. Patient is safe to discharge to skilled nursing facility when medically appropriate.    Follow Up Recommendations  SNF     Equipment Recommendations  None recommended by PT;Other (comment)(TBD further at [facility])    Recommendations for Other Services OT consult     Precautions / Restrictions Precautions Precautions: Fall;Posterior Hip Precaution Booklet Issued: Yes (comment) Precaution Comments: Pt with difficulty understanding precautions Restrictions Weight Bearing Restrictions: Yes LLE Weight Bearing: Weight bearing as tolerated    Mobility  Bed Mobility Overal bed mobility: Needs Assistance Bed Mobility: Sit to Supine     Supine to sit: Mod assist Sit to supine: Mod assist   General bed mobility comments: Bilateral LE assist and cues to transfer back to bed  Transfers Overall transfer level: Needs assistance Equipment used: Rolling walker (2 wheeled) Transfers: Sit to/from Stand Sit to Stand: Max assist;+2 physical assistance         General transfer comment: Patient requires heavy assist to transfer from sit to stand. Cues required for safe hand placement with transfers. Patient requires instruction for full upright posture in standing.  Ambulation/Gait Ambulation/Gait assistance: Mod assist;+2 physical assistance Ambulation Distance (Feet): 3 Feet Assistive device: Rolling walker (2 wheeled) Gait Pattern/deviations: Decreased step length - left;Decreased step length -  right;Decreased weight shift to left Gait velocity: Decreased Gait velocity interpretation: <1.8 ft/sec, indicative of risk for recurrent falls General Gait Details: Patient continues to demonstrate short shuffling steps transfer from bed to recliner. Assist required to advance walker and cues required for turns. Patient unable to ambulate farther at this time   Stairs            Wheelchair Mobility    Modified Rankin (Stroke Patients Only)       Balance Overall balance assessment: Needs assistance Sitting-balance support: No upper extremity supported Sitting balance-Leahy Scale: Good     Standing balance support: Bilateral upper extremity supported Standing balance-Leahy Scale: Fair Standing balance comment: Requires UE support on rolling walker for ambulation                            Cognition Arousal/Alertness: Lethargic Behavior During Therapy: Flat affect Overall Cognitive Status: Within Functional Limits for tasks assessed                                        Exercises Total Joint Exercises Ankle Circles/Pumps: Strengthening;Both;15 reps;Seated(Heel raises) Hip ABduction/ADduction: Strengthening;15 reps;Seated;Both Long Arc Quad: Strengthening;Left;15 reps;Seated Knee Flexion: Strengthening;Left;15 reps;Seated Marching in Standing: Strengthening;Both;15 reps;Seated    General Comments        Pertinent Vitals/Pain Pain Assessment: 0-10 Pain Score: 3  Pain Location: L hip/L knee Pain Descriptors / Indicators: Operative site guarding Pain Intervention(s): Monitored during session    Home Living  Prior Function            PT Goals (current goals can now be found in the care plan section) Acute Rehab PT Goals Patient Stated Goal: To return home PT Goal Formulation: With patient Time For Goal Achievement: 05/20/17 Potential to Achieve Goals: Good Progress towards PT goals: Progressing  toward goals    Frequency    BID      PT Plan Current plan remains appropriate    Co-evaluation              AM-PAC PT "6 Clicks" Daily Activity  Outcome Measure  Difficulty turning over in bed (including adjusting bedclothes, sheets and blankets)?: Unable Difficulty moving from lying on back to sitting on the side of the bed? : Unable Difficulty sitting down on and standing up from a chair with arms (e.g., wheelchair, bedside commode, etc,.)?: Unable Help needed moving to and from a bed to chair (including a wheelchair)?: A Lot Help needed walking in hospital room?: A Lot Help needed climbing 3-5 steps with a railing? : Total 6 Click Score: 8    End of Session Equipment Utilized During Treatment: Gait belt Activity Tolerance: Patient tolerated treatment well Patient left: in bed;with nursing/sitter in room;with call bell/phone within reach Nurse Communication: Mobility status;Other (comment)(BP readings) PT Visit Diagnosis: Unsteadiness on feet (R26.81);Other abnormalities of gait and mobility (R26.89);Muscle weakness (generalized) (M62.81);Pain Pain - Right/Left: Left Pain - part of body: Hip     Time: 8916-9450 PT Time Calculation (min) (ACUTE ONLY): 17 min  Charges:  $Gait Training: 8-22 mins $Therapeutic Exercise: 8-22 mins                    G Codes:       Lyndel Safe Donnelle Rubey PT, DPT    Velinda Wrobel 05/08/2017, 12:25 PM

## 2017-05-08 NOTE — Clinical Social Work Placement (Addendum)
   CLINICAL SOCIAL WORK PLACEMENT  NOTE  Date:  05/08/2017  Patient Details  Name: Darrell Clark MRN: [id number] Date of Birth: 1933-06-09  Clinical Social Work is seeking post-discharge placement for this patient at the [place] level of care (*CSW will initial, date and re-position this form in  chart as items are completed):  Yes   Patient/family provided with [place] Work Department's list of facilities offering this level of care within the geographic area requested by the patient (or if unable, by the patient's family).  Yes   Patient/family informed of their freedom to choose among providers that offer the needed level of care, that participate in Medicare, Medicaid or managed care program needed by the patient, have an available bed and are willing to accept the patient.  Yes   Patient/family informed of [facility]'s ownership interest in [facility] and [facility], as well as of the fact that they are under no obligation to receive care at these facilities.  PASRR submitted to EDS on       PASRR number received on       Existing PASRR number confirmed on 05/07/17     FL2 transmitted to all facilities in geographic area requested by pt/family on 05/07/17     FL2 transmitted to all facilities within larger geographic area on       Patient informed that his/her managed care company has contracts with or will negotiate with certain facilities, including the following:        Yes   Patient/family informed of bed offers received.  Patient [facility] bed at [facility] )     Physician recommends and patient chooses bed at      Patient to be transferred to [facility] ) on 05/08/17.  Patient to be transferred to facility by [facility] )     Patient family notified on 05/08/17 of transfer.  Name of family member notified:  ([place] left patient's son Gerald Stabs, daughter Alleen Borne and niece Tye [place] voicemails. ) Patient's son  Gerald Stabs called CSW back and is in agreement with D/C plan.   PHYSICIAN       Additional Comment:    _______________________________________________ Tracye Szuch, Veronia Beets, LCSW 05/08/2017, 8:34 AM

## 2017-05-08 NOTE — Progress Notes (Signed)
Physical Therapy Treatment Patient Details Name: Darrell Clark MRN: [id number] DOB: 08/29/1932 Today's Date: 05/08/2017    History of Present Illness Pt. is an 83 y.o. male who was admitted to [facility] for a Left THR, posterior approach. Pt. PMHx includes: CVA, Anxiety, depression, CAD, mild Dementia, DM, and spinal stenosis.    PT Comments    Pt remains very limited with his mobility on this date. He continues to requires modA+2 to transfer to recliner and is unable to ambulate farther at this time. He is able to complete all seated exercises as instructed. BP is stable during session. Attempted orthostatics but pt unable to remain standing long enough and relax arms to complete reading. Pt will need SNF placement at discharge.  Pt will benefit from PT services to address deficits in strength, balance, and mobility in order to return to full function at home.   Follow Up Recommendations  SNF     Equipment Recommendations  None recommended by PT;Other (comment)(TBD further at [facility])    Recommendations for Other Services OT consult     Precautions / Restrictions Precautions Precautions: Fall;Posterior Hip Precaution Booklet Issued: Yes (comment) Precaution Comments: Pt with difficulty understanding precautions Restrictions Weight Bearing Restrictions: Yes LLE Weight Bearing: Weight bearing as tolerated    Mobility  Bed Mobility Overal bed mobility: Needs Assistance Bed Mobility: Sit to Supine     Supine to sit: Mod assist     General bed mobility comments: ModA+1 and heavy cues for bed mobility. Increase in LLE pain. Poor sequencing. Assist to go from R sidelying to sitting. HOB elevated and use of rails  Transfers Overall transfer level: Needs assistance Equipment used: Rolling walker (2 wheeled) Transfers: Sit to/from Stand Sit to Stand: Max assist;+2 physical assistance         General transfer comment: Elevated bed and heavy cues for sequencing with transfer.  Bilateral LE buckling in standing. Attempted to get orthostatics in standing but pt unable to relax arm for BP reading and machine errors. Pt with continual posterior leaning requiring modA+1 from therapist to remain in standing  Ambulation/Gait Ambulation/Gait assistance: Mod assist;+2 physical assistance Ambulation Distance (Feet): 3 Feet Assistive device: Rolling walker (2 wheeled) Gait Pattern/deviations: Decreased step length - left;Decreased step length - right;Decreased weight shift to left Gait velocity: Decreased Gait velocity interpretation: <1.8 ft/sec, indicative of risk for recurrent falls General Gait Details: Pt takes short, unsteady steps to go from bed to recliner. Heavy cues required for proper sequencing with rolling walker. RLE buckling during ambulation. Pt fatigued following ambulation.   Stairs            Wheelchair Mobility    Modified Rankin (Stroke Patients Only)       Balance Overall balance assessment: Needs assistance Sitting-balance support: No upper extremity supported Sitting balance-Leahy Scale: Good     Standing balance support: Bilateral upper extremity supported Standing balance-Leahy Scale: Fair Standing balance comment: Requires UE support on rolling walker for ambulation                            Cognition Arousal/Alertness: Lethargic Behavior During Therapy: Flat affect Overall Cognitive Status: Within Functional Limits for tasks assessed                                        Exercises Total Joint Exercises Hip ABduction/ADduction: Strengthening;Left;15  reps;Seated Long Arc Quad: Strengthening;Left;15 reps;Seated Knee Flexion: Strengthening;Left;15 reps;Seated Marching in Standing: Strengthening;Both;15 reps;Seated    General Comments        Pertinent Vitals/Pain Pain Assessment: 0-10 Pain Score: 2  Pain Location: L hip/L knee Pain Descriptors / Indicators: Operative site guarding Pain  Intervention(s): Monitored during session    Home Living                      Prior Function            PT Goals (current goals can now be found in the care plan section) Acute Rehab PT Goals Patient Stated Goal: To return home PT Goal Formulation: With patient Time For Goal Achievement: 05/20/17 Potential to Achieve Goals: Good Progress towards PT goals: Progressing toward goals    Frequency    BID      PT Plan Current plan remains appropriate    Co-evaluation              AM-PAC PT "6 Clicks" Daily Activity  Outcome Measure  Difficulty turning over in bed (including adjusting bedclothes, sheets and blankets)?: Unable Difficulty moving from lying on back to sitting on the side of the bed? : Unable Difficulty sitting down on and standing up from a chair with arms (e.g., wheelchair, bedside commode, etc,.)?: Unable Help needed moving to and from a bed to chair (including a wheelchair)?: A Lot Help needed walking in hospital room?: A Lot Help needed climbing 3-5 steps with a railing? : Total 6 Click Score: 8    End of Session Equipment Utilized During Treatment: Gait belt Activity Tolerance: Patient tolerated treatment well Patient left: with call bell/phone within reach;in chair;with chair alarm set Nurse Communication: Mobility status;Other (comment)(BP readings) PT Visit Diagnosis: Unsteadiness on feet (R26.81);Other abnormalities of gait and mobility (R26.89);Muscle weakness (generalized) (M62.81);Pain Pain - Right/Left: Left Pain - part of body: Hip     Time: 0263-7858 PT Time Calculation (min) (ACUTE ONLY): 24 min  Charges:  $Gait Training: 8-22 mins $Therapeutic Exercise: 8-22 mins                    G Codes:       Lyndel Safe Maram Bently PT, DPT     Leyli Kevorkian 05/08/2017, 11:17 AM

## 2017-05-08 NOTE — Progress Notes (Signed)
Called OR nurse with Dr. Marry Guan, patient's BP in 80's, new order for 579ml bolus. Will continue to monitor.

## 2017-05-10 ENCOUNTER — Other Ambulatory Visit
Admission: RE | Admit: 2017-05-10 | Discharge: 2017-05-10 | Disposition: A | Discharge status: Home / Self Care | Payer: Medicare Other | Source: Ambulatory Visit | Attending: Internal Medicine | Admitting: Internal Medicine

## 2017-05-10 DIAGNOSIS — R410 Disorientation, unspecified: Secondary | ICD-10-CM | POA: Diagnosis present

## 2017-05-10 DIAGNOSIS — R3915 Urgency of urination: Secondary | ICD-10-CM | POA: Diagnosis present

## 2017-05-10 LAB — URINALYSIS, COMPLETE (UACMP) WITH MICROSCOPIC
BACTERIA UA: NONE SEEN
Bilirubin Urine: NEGATIVE
GLUCOSE, UA: NEGATIVE mg/dL
HGB URINE DIPSTICK: NEGATIVE
Ketones, ur: NEGATIVE mg/dL
LEUKOCYTES UA: NEGATIVE
NITRITE: NEGATIVE
PROTEIN: NEGATIVE mg/dL
SPECIFIC GRAVITY, URINE: 1.021 (ref 1.005–1.030)
pH: 5 (ref 5.0–8.0)

## 2017-05-12 LAB — URINE CULTURE: Culture: NO GROWTH

## 2017-05-13 DIAGNOSIS — G8918 Other acute postprocedural pain: Secondary | ICD-10-CM | POA: Insufficient documentation

## 2017-05-13 DIAGNOSIS — M25552 Pain in left hip: Secondary | ICD-10-CM

## 2017-05-14 ENCOUNTER — Encounter: Payer: Self-pay | Admitting: Orthopedic Surgery

## 2017-05-18 ENCOUNTER — Encounter
Admission: RE | Admit: 2017-05-18 | Discharge: 2017-05-18 | Disposition: A | Discharge status: Home / Self Care | Payer: Medicare Other | Source: Ambulatory Visit | Attending: Internal Medicine | Admitting: Internal Medicine

## 2017-05-20 ENCOUNTER — Other Ambulatory Visit: Payer: Self-pay

## 2017-05-20 MED ORDER — HYDROCODONE-ACETAMINOPHEN 5-325 MG PO TABS: 1.0000 | ORAL_TABLET | ORAL | 0 refills | Status: DC | PRN

## 2017-05-20 NOTE — Telephone Encounter (Signed)
Rx sent to [facility] phone : [phone] , fax : [phone]  

## 2017-05-21 ENCOUNTER — Non-Acute Institutional Stay (SKILLED_NURSING_FACILITY): Payer: Medicare Other | Admitting: Gerontology

## 2017-05-21 DIAGNOSIS — M1612 Unilateral primary osteoarthritis, left hip: Secondary | ICD-10-CM

## 2017-05-21 DIAGNOSIS — Z96642 Presence of left artificial hip joint: Secondary | ICD-10-CM | POA: Diagnosis not present

## 2017-05-22 ENCOUNTER — Encounter: Payer: Self-pay | Admitting: Gerontology

## 2017-05-22 IMAGING — CT CT HEAD W/O CM
3 series · 14 of 47 positions shown, 16 images · non-contrast
Comparison: 04/15/2016 MRI of the brain. 04/15/2016 CT angiogram
head and neck.

CLINICAL DATA: 83 y/o M; transient buzzing feeling in in the head
and pain.

EXAM:
CT HEAD WITHOUT CONTRAST
TECHNIQUE: Contiguous axial images were obtained from the base of the skull
through the vertex without intravenous contrast.

[Series 2: head wo · axial · 0.47mm/px · z∈[-148,-23]mm · 8 of 30 slices shown, 10 images]
[im 3/30  brain]
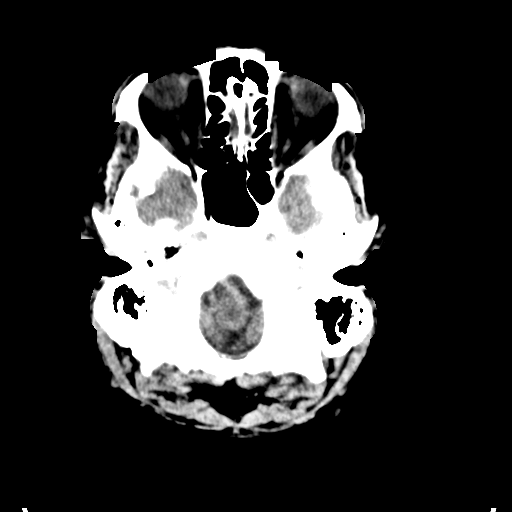
[im 3/30  bone]
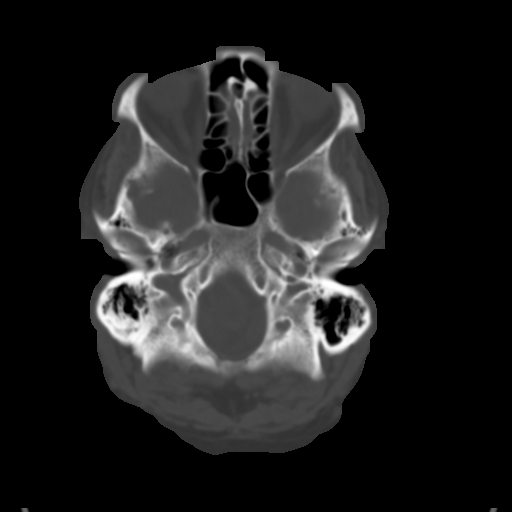
[im 7/30  brain]
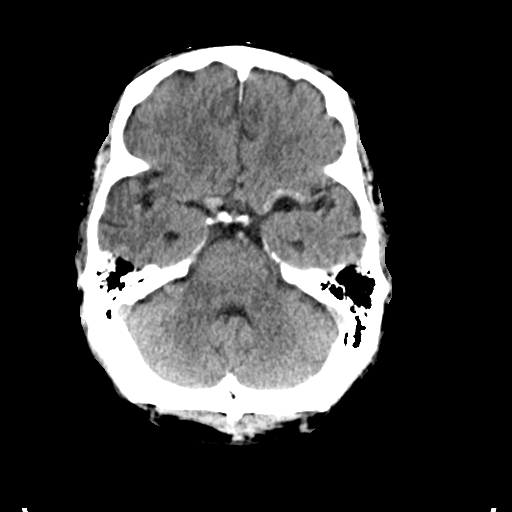
[im 10/30  brain]
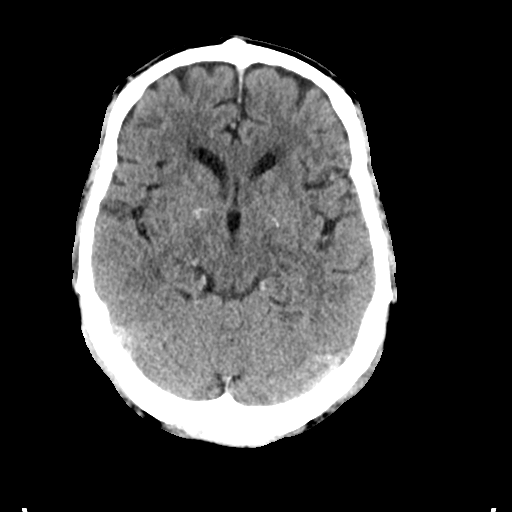
[im 14/30  brain]
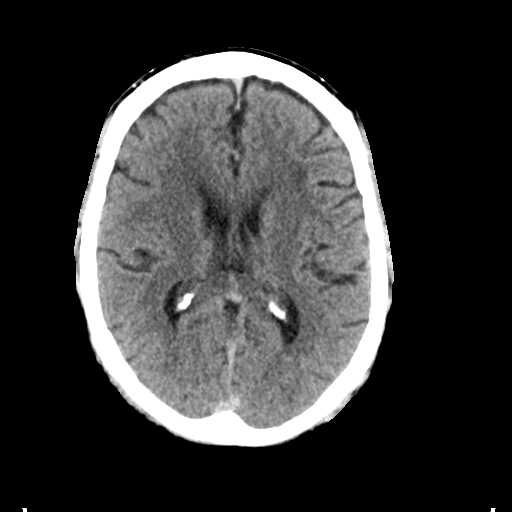
[im 17/30  brain]
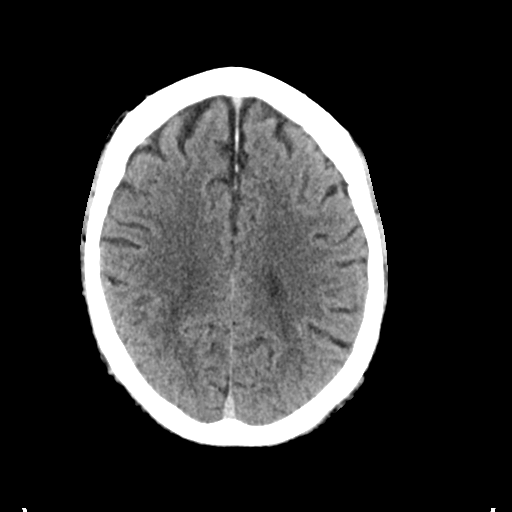
[im 17/30  bone]
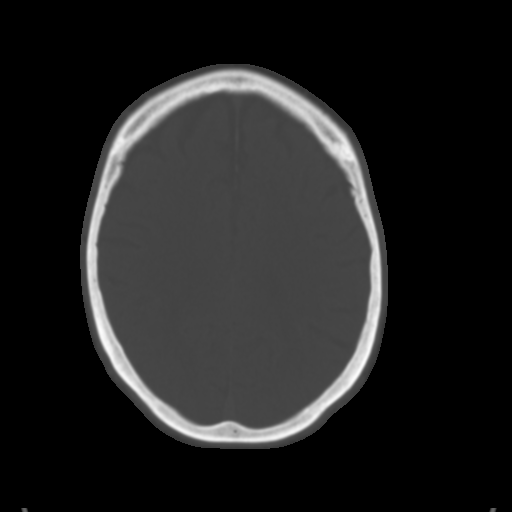
[im 21/30  brain]
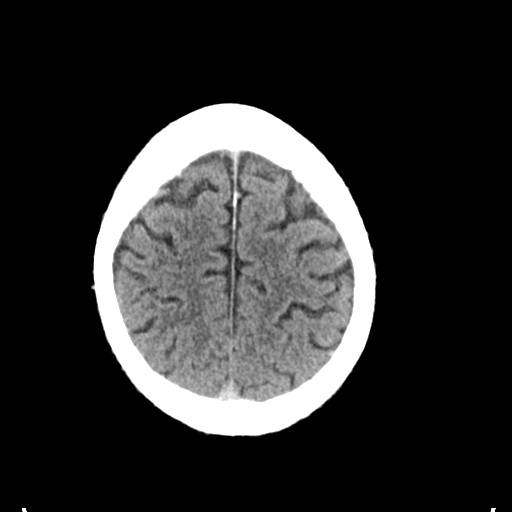
[im 24/30  brain]
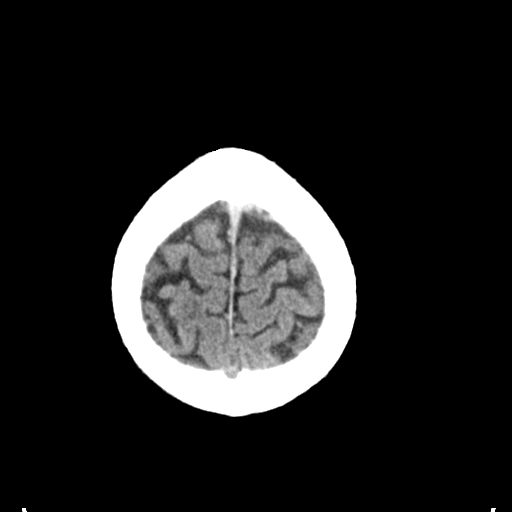
[im 28/30  brain]
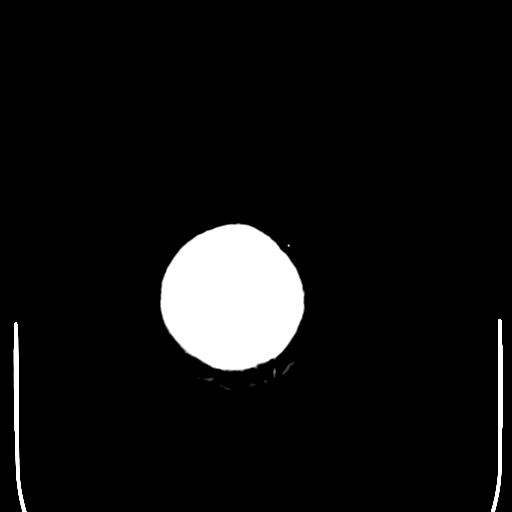

[Series 4: coronal soft tissue · coronal · 0.29mm/px · 3 of 65 slices shown]
[im 22/65  brain]
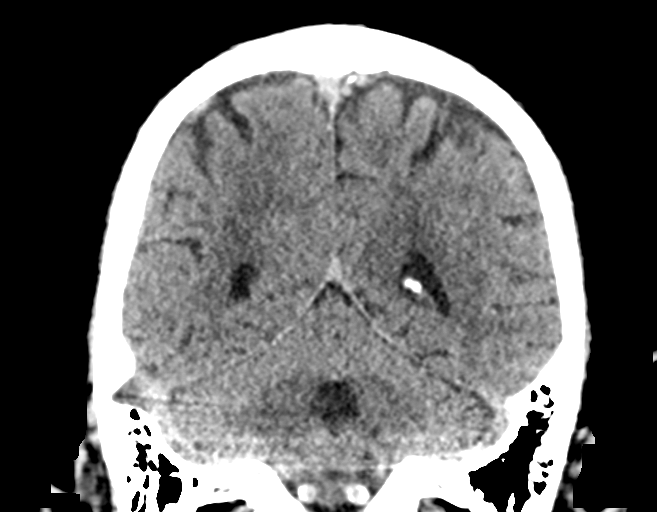
[im 29/65  brain]
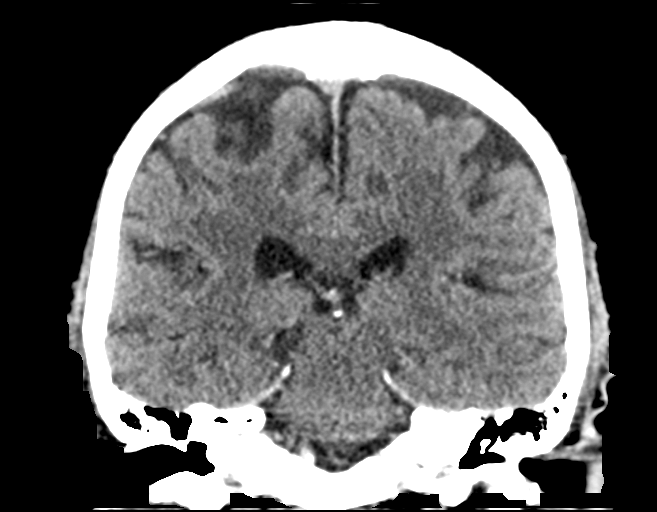
[im 36/65  brain]
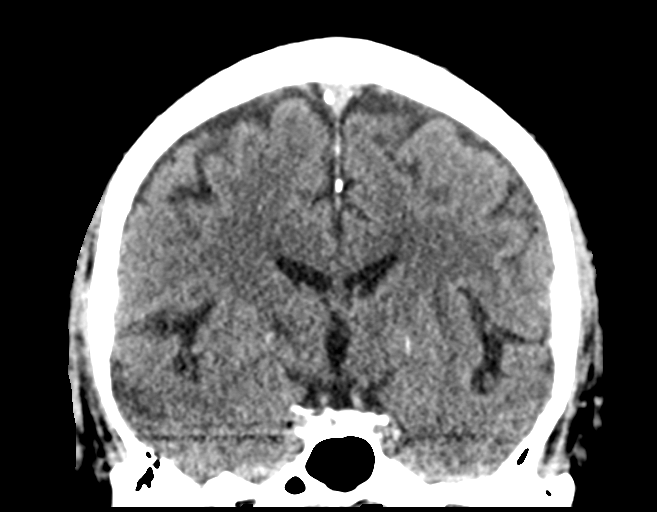

[Series 5: sagittal soft tissue · sagittal · 0.30mm/px · 3 of 54 slices shown]
[im 18/54  brain]
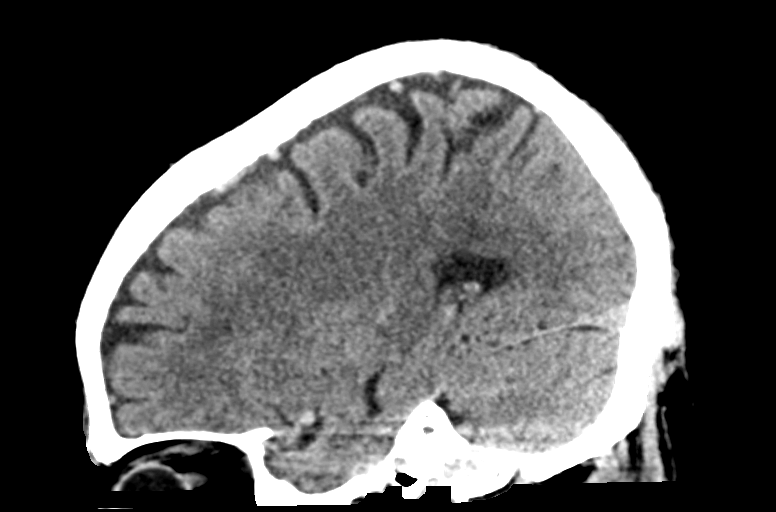
[im 27/54  brain]
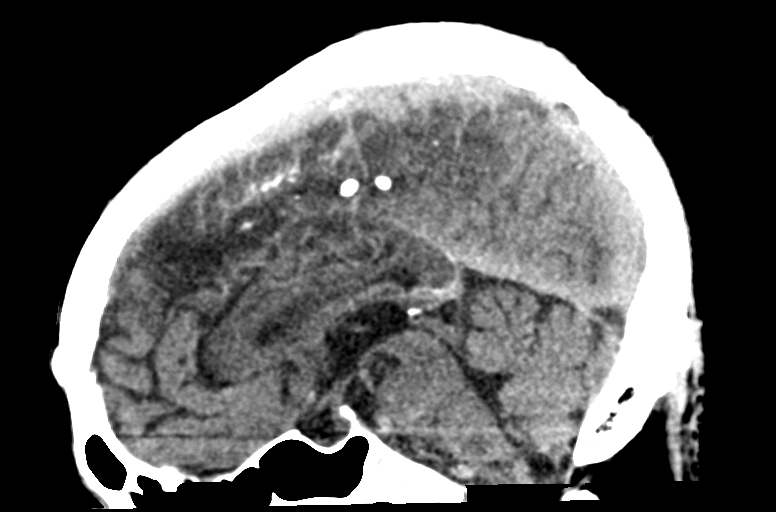
[im 36/54  brain]
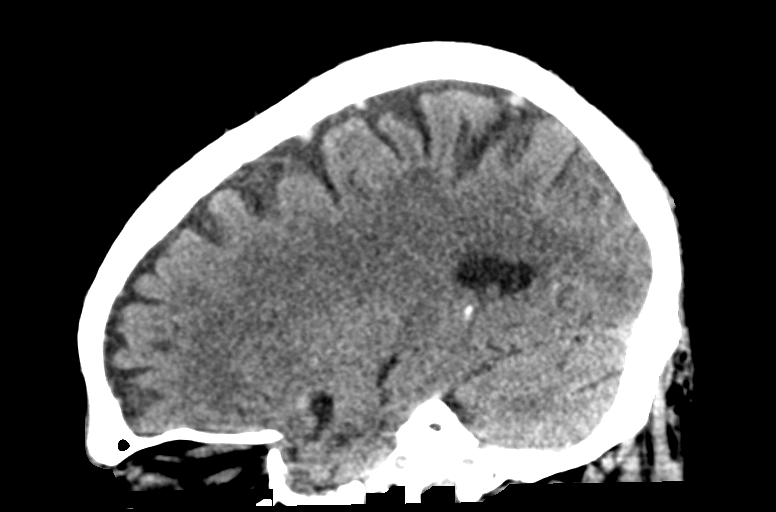

[14 of 47 positions shown; findings below may reference images not displayed]

FINDINGS: Brain: No evidence of acute infarction, hemorrhage, hydrocephalus,
extra-axial collection or mass lesion/mass effect. Stable foci of
hypoattenuation in subcortical and periventricular white matter
compatible with mild chronic microvascular ischemic changes and
parenchymal volume loss. Chronic lacunar infarcts within left
lentiform nucleus and bilateral thalamus.

Vascular: No hyperdense vessel. Extensive calcific atherosclerosis
of carotid siphons and vertebral arteries.

Skull: Normal. Negative for fracture or focal lesion.

Sinuses/Orbits: No acute finding.

Other: Right suboccipital scalp 8 x 25 mm lipoma.
IMPRESSION: 1. No acute intracranial abnormality.
2. Stable chronic microvascular ischemic changes and parenchymal
volume loss of the brain.

By: Blade Aujla M.D.

## 2017-05-22 IMAGING — CR DG CHEST 2V
1 series · 2 of 2 positions shown · non-contrast
Comparison: 08/22/2006

CLINICAL DATA: Chest pain and shortness of Breath

EXAM:
CHEST  2 VIEW

[Series 1: dg chest 2 view · 0.14mm/px · 2 of 2 slices shown]
[im 1/2]
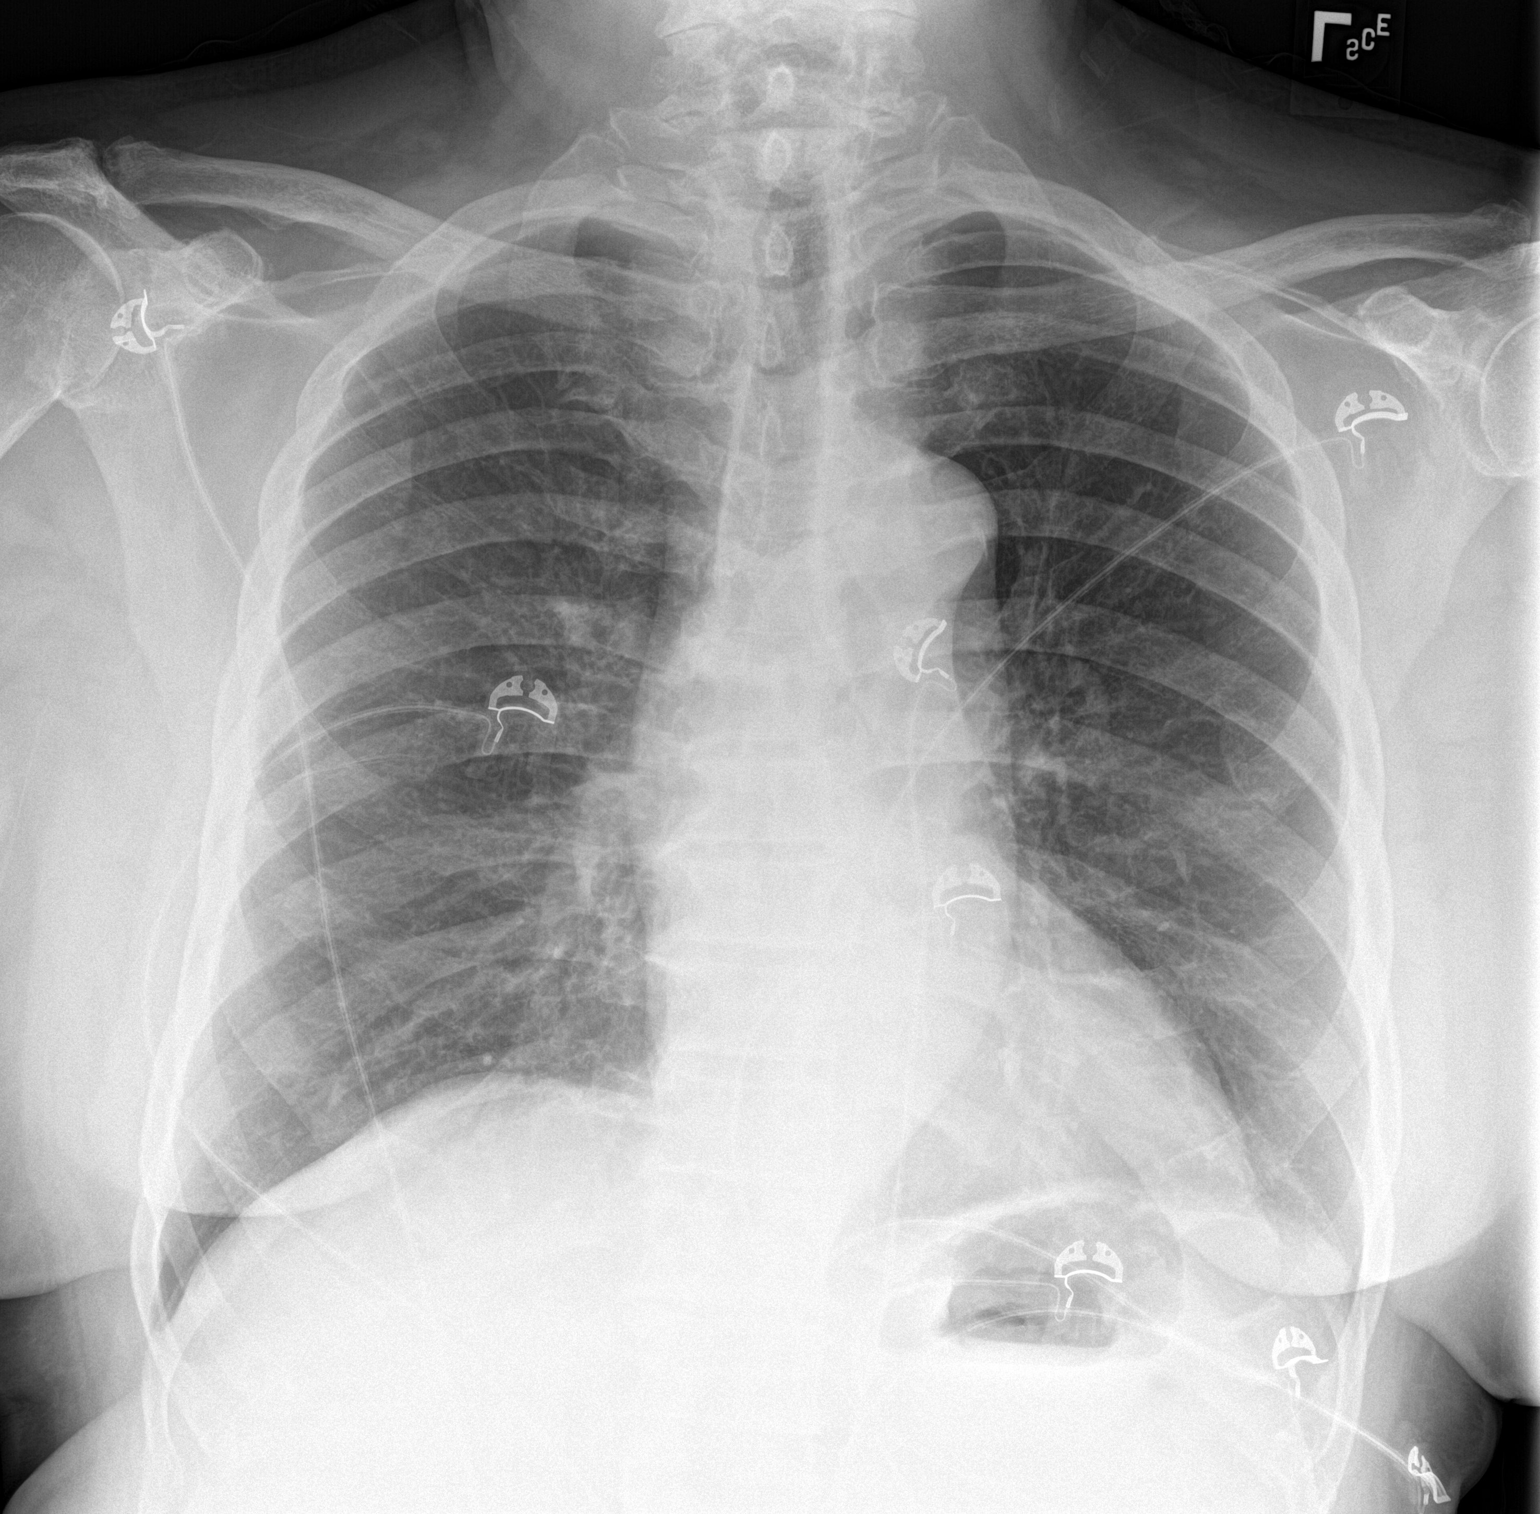
[im 2/2]
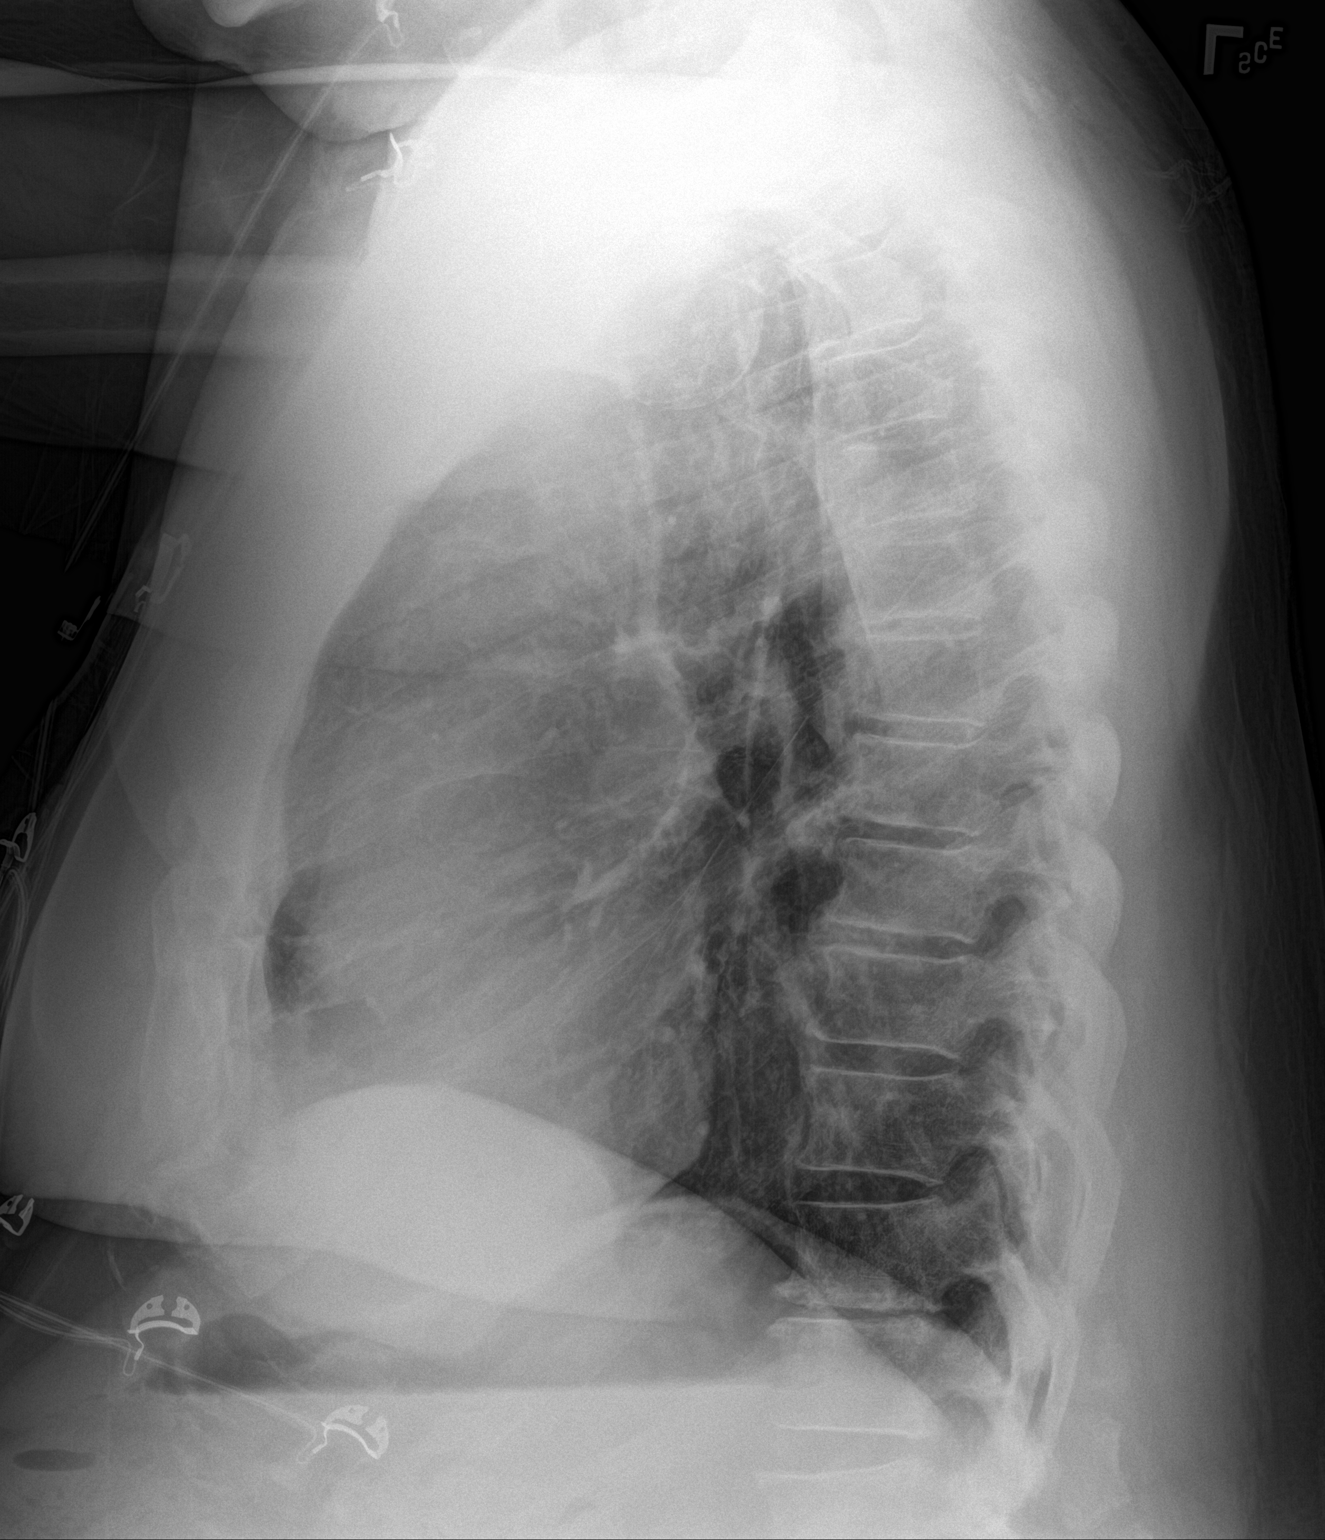

[2 of 2 positions shown; findings below may reference images not displayed]

FINDINGS: The heart size and mediastinal contours are within normal limits.
Both lungs are clear. The visualized skeletal structures are
unremarkable.
IMPRESSION: No active cardiopulmonary disease.

## 2017-05-22 NOTE — Progress Notes (Signed)
Location:   [facility] Room Number: [place] of Service:  SNF [id number]) Provider:  Toni Arthurs, NP-C  [place] Pink, MD  Patient Care Team: [place] Pink, MD as PCP - General (Family Medicine)  Extended Emergency Contact Information Primary Emergency Contact: [place] of [place] Mobile Phone: [phone] Relation: Daughter Secondary Emergency Contact: [place] of [place] Work Phone: [phone] Mobile Phone: [phone] Relation: Son  Code Status:  FULL Goals of care: Advanced Directive information Advanced Directives 05/21/2017  Does Patient Have a Medical Advance Directive? No  Type of Advance Directive -  Does patient want to make changes to medical advance directive? No - Patient declined  Copy of [place] in Chart? -  Would patient like information on creating a medical advance directive? -     Chief Complaint  Patient presents with  . Medical Management of Chronic Issues    Routine Visit    HPI:  Pt is a 83 y.o. male seen today for medical management of chronic diseases.  Patient was admitted to the facility for rehab status post admission to [facility] for a left total hip arthroplasty.  Patient is been participating in PT/OT.  Patient reports his pain is well controlled.  Incision well approximated, dressing CDI.  Patient reports his appetite is good, he is voiding well, and having regular BMs.  Patient denies chest pain or shortness of breath.  Vital signs stable.  No other complaints.   Past Medical History:  Diagnosis Date  . Acquired cyst of kidney   . Allergy   . Anxiety   . Arthritis   . Atherosclerosis   . BPH (benign prostatic hyperplasia)   . CAD (coronary artery disease)   . Cardiac arrhythmia   . Chronic prostatitis   . Degenerative arthritis   . Dementia    mild  . Depression   . Diabetes mellitus without complication ([place])   . Dyspnea    with exertion  .  Elevated prostate specific antigen (PSA)   . Encysted hydrocele   . GERD (gastroesophageal reflux disease)   . Glaucoma (increased eye pressure)   . Gout   . Gross hematuria   . Hemorrhoids   . HLD (hyperlipidemia)   . Hyperlipidemia, unspecified   . Hypertension   . Hypertrophy of breast   . Impotence of organic origin   . Incomplete bladder emptying   . Malignant neoplasm of prostate ([place])   . Osteoarthritis   . Other specified disorders of the male genital organs   . Prostate cancer ([place])   . PVD (peripheral vascular disease) ([place])   . Sciatica   . Spermatocele   . Spinal stenosis of lumbar region with radiculopathy 11/11/2015  . Stroke ([place])   . Testicular hypofunction    other  . Urinary frequency   . Urinary obstruction    not elsewhere classified  . Vertigo    Past Surgical History:  Procedure Laterality Date  . APPENDECTOMY    . CARDIAC CATHETERIZATION  11/2013  . FIDUCIAL SEED PLACEMENT  10/14/2009  . PROSTATE BIOPSY  08/11/2009  . RADIATION FOR PROSTATE CANCER    . TOTAL HIP ARTHROPLASTY Left 05/06/2017   Procedure: TOTAL HIP ARTHROPLASTY;  Surgeon: Dereck Leep, MD;  Location: [facility] ORS;  Service: Orthopedics;  Laterality: Left;    Allergies  Allergen Reactions  . Niacin Itching and Rash    Other reaction(s): Unknown Other reaction(s): UNKNOWN  . Niacin And  Related Rash    Allergies as of 05/21/2017      Reactions   Niacin Itching, Rash   Other reaction(s): Unknown Other reaction(s): UNKNOWN   Niacin And Related Rash      Medication List        Accurate as of 05/21/17 11:59 PM. Always use your most recent med list.          acetaminophen 500 MG tablet Commonly known as:  TYLENOL Take 1,000 mg by mouth 3 (three) times daily.   amLODipine 5 MG tablet Commonly known as:  NORVASC Take 5 mg by mouth daily.   aspirin 81 MG EC tablet Take 1 tablet (81 mg total) by mouth daily.   benazepril 20 MG tablet Commonly known as:  LOTENSIN Take  20 mg by mouth daily.   docusate sodium 100 MG capsule Commonly known as:  COLACE Take 100 mg by mouth 2 (two) times daily as needed. As needed.   donepezil 10 MG tablet Commonly known as:  ARICEPT Take 10 mg by mouth at bedtime.   escitalopram 10 MG tablet Commonly known as:  LEXAPRO Take 10 mg by mouth daily.   fluticasone 50 MCG/ACT nasal spray Commonly known as:  FLONASE Place 2 sprays into both nostrils daily.   glipiZIDE 2.5 MG 24 hr tablet Commonly known as:  GLUCOTROL XL Take 1 tablet (2.5 mg total) by mouth daily with breakfast.   latanoprost 0.005 % ophthalmic solution Commonly known as:  XALATAN Place 1 drop into the right eye at bedtime.   LORazepam 0.5 MG tablet Commonly known as:  ATIVAN Take 0.5 mg by mouth every 8 (eight) hours.   meclizine 12.5 MG tablet Commonly known as:  ANTIVERT Take 12.5 mg by mouth 3 (three) times daily as needed for dizziness.   montelukast 10 MG tablet Commonly known as:  SINGULAIR Take 10 mg by mouth daily.   nitroGLYCERIN 0.4 MG SL tablet Commonly known as:  NITROSTAT Place 0.4 mg under the tongue every 5 (five) minutes as needed. For chest pain. Give up to 3 doses every 5 minutes as needed   OSTEO BI-FLEX TRIPLE STRENGTH Tabs Take 1 tablet by mouth 2 (two) times daily.   pantoprazole 40 MG tablet Commonly known as:  PROTONIX Take 40 mg by mouth daily.   simvastatin 20 MG tablet Commonly known as:  ZOCOR Take 20 mg by mouth daily.   SYSTANE 0.4-0.3 % Soln Generic drug:  Polyethyl Glycol-Propyl Glycol Place 1 drop into both eyes 2 (two) times daily.   traMADol 50 MG tablet Commonly known as:  ULTRAM Take 50-100 mg by mouth every 4 (four) hours as needed.       Review of Systems  Constitutional: Negative for activity change, appetite change, chills, diaphoresis and fever.  HENT: Negative for congestion, mouth sores, nosebleeds, postnasal drip, sneezing, sore throat, trouble swallowing and voice change.     Respiratory: Negative for apnea, cough, choking, chest tightness, shortness of breath and wheezing.   Cardiovascular: Negative for chest pain, palpitations and leg swelling.  Gastrointestinal: Negative for abdominal distention, abdominal pain, constipation, diarrhea and nausea.  Genitourinary: Negative for difficulty urinating, dysuria, frequency and urgency.  Musculoskeletal: Positive for arthralgias (typical arthritis). Negative for back pain, gait problem and myalgias.  Skin: Negative for color change, pallor, rash and wound.  Neurological: Negative for dizziness, tremors, syncope, speech difficulty, weakness, numbness and headaches.  Psychiatric/Behavioral: Negative for agitation and behavioral problems.  All other systems reviewed and are negative.  Immunization History  Administered Date(s) Administered  . Influenza Inj Mdck Quad With Preservative 04/03/2017   Pertinent  Health Maintenance Due  Topic Date Due  . FOOT EXAM  08/25/1942  . OPHTHALMOLOGY EXAM  08/25/1942  . PNA vac Low Risk Adult (1 of 2 - PCV13) 08/26/1999  . HEMOGLOBIN A1C  10/22/2017  . INFLUENZA VACCINE  Completed   Fall Risk  10/04/2016 05/08/2016 05/07/2016  Falls in the past year? Yes Yes Yes  Number falls in past yr: 1 - 1  Comment - - fell out of bed few weeks ago, got to close to the edge  Injury with Fall? No - No  Risk for fall due to : Impaired balance/gait;Impaired vision - Impaired balance/gait;Impaired vision  Follow up Falls prevention discussed - Falls prevention discussed   Functional Status Survey:    Vitals:   05/21/17 0912  BP: 109/70  Pulse: 73  Resp: 20  Temp: 98 F (36.7 C)  TempSrc: Oral  SpO2: 98%  Weight: 217 lb (98.4 kg)  Height: 5\' 10"  (1.778 m)   Body mass index is 31.14 kg/m. Physical Exam  Constitutional: He is oriented to person, place, and time. Vital signs are normal. He appears well-developed and well-nourished. He is active and cooperative. He does not appear  ill. No distress.  HENT:  Head: Normocephalic and atraumatic.  Mouth/Throat: Uvula is midline, oropharynx is clear and moist and mucous membranes are normal. Mucous membranes are not pale, not dry and not cyanotic.  Eyes: Conjunctivae, EOM and lids are normal. Pupils are equal, round, and reactive to light.  Neck: Trachea normal, normal range of motion and full passive range of motion without pain. Neck supple. No JVD present. No tracheal deviation, no edema and no erythema present. No thyromegaly present.  Cardiovascular: Normal rate, regular rhythm, intact distal pulses and normal pulses. Exam reveals no gallop, no distant heart sounds and no friction rub.  Murmur heard. Pulses:      Dorsalis pedis pulses are 2+ on the right side, and 2+ on the left side.  No edema  Pulmonary/Chest: Effort normal and breath sounds normal. No accessory muscle usage. No respiratory distress. He has no decreased breath sounds. He has no wheezes. He has no rhonchi. He has no rales. He exhibits no tenderness.  Abdominal: Soft. Normal appearance and bowel sounds are normal. He exhibits no distension and no ascites. There is no tenderness.  Musculoskeletal: He exhibits no edema.       Left hip: He exhibits decreased range of motion, decreased strength, tenderness and laceration.  Expected osteoarthritis, stiffness; Bilateral Calves soft, supple. Negative Homan's Sign. B- pedal pulses equal  Neurological: He is alert and oriented to person, place, and time. He has normal strength.  Skin: Skin is warm and dry. Laceration noted. He is not diaphoretic. No cyanosis. No pallor. Nails show no clubbing.  Psychiatric: He has a normal mood and affect. His speech is normal and behavior is normal. Judgment and thought content normal. Cognition and memory are normal.  Nursing note and vitals reviewed.   Labs reviewed: Recent Labs    12/15/16 0502 12/30/16 1223 04/24/17 0946  NA 138 138 139  K 3.9 4.5 3.5  CL 104 104 104    CO2 28 29 26   GLUCOSE 135* 97 110*  BUN 16 20 14   CREATININE 1.00 1.29* 0.95  CALCIUM 8.9 9.0 9.0   Recent Labs    12/30/16 1223 04/24/17 0946  AST 17 20  ALT 18  16*  ALKPHOS 83 84  BILITOT 0.8 1.0  PROT 7.1 7.7  ALBUMIN 4.0 4.2   Recent Labs    12/15/16 0502 12/30/16 1223 04/24/17 0946 05/08/17 0926  WBC 6.5 6.7 6.1  --   NEUTROABS  --  3.8 3.8  --   HGB 16.7 16.7 16.8 12.0*  HCT 47.9 48.5 51.3 35.6*  MCV 93.6 95.2 95.6  --   PLT 178 173 181  --    Lab Results  Component Value Date   TSH 1.879 12/14/2016   Lab Results  Component Value Date   HGBA1C 5.8 (H) 04/24/2017   Lab Results  Component Value Date   CHOL 93 04/15/2016   HDL 31 (L) 04/15/2016   LDLCALC 47 04/15/2016   TRIG 74 04/15/2016   CHOLHDL 3.0 04/15/2016    Significant Diagnostic Results in last 30 days:  Dg Hip Port Unilat With Pelvis 1v Left  Result Date: 05/06/2017 CLINICAL DATA:  Left total hip arthroplasty. EXAM: DG HIP (WITH OR WITHOUT PELVIS) 1V PORT LEFT COMPARISON:  Radiographs 12/30/2016. FINDINGS: Status post left total hip arthroplasty. The hardware appears well positioned. There is a cerclage wire surrounding the trochanteric region. No underlying displaced fracture or dislocation identified. Skin staples and surgical drains are in place. IMPRESSION: No demonstrated complication following left total hip arthroplasty. Electronically Signed   By: Richardean Sale M.D.   On: 05/06/2017 12:29    Assessment/Plan 1.  Primary osteoarthritis of left hip.  2.  Status post total replacement of left hip  Continue PT/OT  Continue exercises as taught by PT/OT  Ice pack to the hip as needed for pain or swelling  Skin/incision care per protocol  Continue Tylenol 1000 mg p.o. 3 times daily scheduled for pain  Continue tramadol 50 mg 1-2 tablets every 4 hours as needed pain  Continue thigh-high TED hose, all in the early a.m./off in the evening  Continue Lovenox 40 mg subcu daily x1  more day for DVT prophylaxis  Follow-up with orthopedist as instructed for continuity of care   Family/ staff Communication:   Total Time:  Documentation:  Face to Face:  Family/Phone:   Labs/tests ordered:    Medication list reviewed and assessed for continued appropriateness. Monthly medication orders reviewed and signed.  Vikki Ports, NP-C Geriatrics [facility] [phone], [place] [address] Cell Phone (Mon-Fri 8am-5pm):  [phone] On Call:  [phone] & follow prompts after 5pm & weekends Office Phone:  [phone] Office Fax:  [phone]

## 2017-05-24 ENCOUNTER — Non-Acute Institutional Stay (SKILLED_NURSING_FACILITY): Payer: Medicare Other | Admitting: Gerontology

## 2017-05-24 DIAGNOSIS — Z96642 Presence of left artificial hip joint: Secondary | ICD-10-CM | POA: Diagnosis not present

## 2017-05-24 DIAGNOSIS — M1612 Unilateral primary osteoarthritis, left hip: Secondary | ICD-10-CM

## 2017-05-30 ENCOUNTER — Encounter: Payer: Self-pay | Admitting: Gerontology

## 2017-05-30 NOTE — Progress Notes (Signed)
Location:   [facility] Room Number: [place] of Service:  SNF [phone])  Provider: Toni Arthurs, NP-C  PCP: Maryland Pink, MD Patient Care Team: Maryland Pink, MD as PCP - General (Family Medicine)  Extended Emergency Contact Information Primary Emergency Contact: Alba [employer] Phone: [phone] Relation: Daughter Secondary Emergency Contact: Poth,Chris  [place] Phone: [phone] Work Phone: [phone] Mobile Phone: [phone] Relation: Son  Code Status: FULL Goals of care:  Advanced Directive information Advanced Directives 05/24/2017  Does Patient Have a Medical Advance Directive? No  Type of Advance Directive -  Does patient want to make changes to medical advance directive? No - Patient declined  Copy of [place] in Chart? -  Would patient like information on creating a medical advance directive? -     Allergies  Allergen Reactions  . Niacin Itching and Rash    Other reaction(s): Unknown Other reaction(s): UNKNOWN  . Niacin And Related Rash    Chief Complaint  Patient presents with  . Discharge Note    Discharged from SNF    HPI:  83 y.o. male seen today for discharge evaluation.  Patient was admitted to the facility for rehab status post left total hip replacement at [facility].  Patient has been participating in PT/OT.  Patient reports his pain is well controlled on current regimen.  Patient reports appetite is good, voiding well and having regular BMs.  Incision is well approximated.  No redness, no drainage.  Bilateral calves soft, supple.  Negative Homans sign.  Patient reports he is feeling well and ready to go home.  Patient has no concerns at this time.  Vital signs stable.  No other complaints.    Past Medical History:  Diagnosis Date  . Acquired cyst of kidney   . Allergy   . Anxiety   . Arthritis   . Atherosclerosis   . BPH (benign  prostatic hyperplasia)   . CAD (coronary artery disease)   . Cardiac arrhythmia   . Chronic prostatitis   . Degenerative arthritis   . Dementia    mild  . Depression   . Diabetes mellitus without complication ([place])   . Dyspnea    with exertion  . Elevated prostate specific antigen (PSA)   . Encysted hydrocele   . GERD (gastroesophageal reflux disease)   . Glaucoma (increased eye pressure)   . Gout   . Gross hematuria   . Hemorrhoids   . HLD (hyperlipidemia)   . Hyperlipidemia, unspecified   . Hypertension   . Hypertrophy of breast   . Impotence of organic origin   . Incomplete bladder emptying   . Malignant neoplasm of prostate ([place])   . Osteoarthritis   . Other specified disorders of the male genital organs   . Prostate cancer ([place])   . PVD (peripheral vascular disease) ([place])   . Sciatica   . Spermatocele   . Spinal stenosis of lumbar region with radiculopathy 11/11/2015  . Stroke ([place])   . Testicular hypofunction    other  . Urinary frequency   . Urinary obstruction    not elsewhere classified  . Vertigo     Past Surgical History:  Procedure Laterality Date  . APPENDECTOMY    . CARDIAC CATHETERIZATION  11/2013  . FIDUCIAL SEED PLACEMENT  10/14/2009  . PROSTATE BIOPSY  08/11/2009  . RADIATION FOR PROSTATE CANCER    . TOTAL HIP ARTHROPLASTY Left 05/06/2017  Procedure: TOTAL HIP ARTHROPLASTY;  Surgeon: Dereck Leep, MD;  Location: [facility] ORS;  Service: Orthopedics;  Laterality: Left;      reports that he quit smoking about 9 years ago. His smoking use included cigarettes. He has a 50.00 pack-year smoking history. he has never used smokeless tobacco. He reports that he does not drink alcohol or use drugs. Social History   Socioeconomic History  . Marital status: Widowed    Spouse name: Not on file  . Number of children: 2  . Years of education: 12+  . Highest education level: Some college, no degree  Social Needs  . Financial resource strain: Not on file    . Food insecurity - worry: Not on file  . Food insecurity - inability: Not on file  . Transportation needs - medical: Not on file  . Transportation needs - non-medical: Not on file  Occupational History  . Not on file  Tobacco Use  . Smoking status: Former Smoker    Packs/day: 1.00    Years: 50.00    Pack years: 50.00    Types: Cigarettes    Last attempt to quit: 11/19/2007    Years since quitting: 9.5  . Smokeless tobacco: Never Used  Substance and Sexual Activity  . Alcohol use: No  . Drug use: No  . Sexual activity: Not on file  Other Topics Concern  . Not on file  Social History Narrative  . Not on file   Functional Status Survey:    Allergies  Allergen Reactions  . Niacin Itching and Rash    Other reaction(s): Unknown Other reaction(s): UNKNOWN  . Niacin And Related Rash    Pertinent  Health Maintenance Due  Topic Date Due  . FOOT EXAM  08/25/1942  . OPHTHALMOLOGY EXAM  08/25/1942  . PNA vac Low Risk Adult (1 of 2 - PCV13) 08/26/1999  . HEMOGLOBIN A1C  10/22/2017  . INFLUENZA VACCINE  Completed    Medications: Allergies as of 05/24/2017      Reactions   Niacin Itching, Rash   Other reaction(s): Unknown Other reaction(s): UNKNOWN   Niacin And Related Rash      Medication List        Accurate as of 05/24/17 11:59 PM. Always use your most recent med list.          acetaminophen 500 MG tablet Commonly known as:  TYLENOL Take 1,000 mg by mouth 3 (three) times daily.   amLODipine 5 MG tablet Commonly known as:  NORVASC Take 5 mg by mouth daily.   aspirin 81 MG EC tablet Take 1 tablet (81 mg total) by mouth daily.   benazepril 20 MG tablet Commonly known as:  LOTENSIN Take 20 mg by mouth daily.   clopidogrel 75 MG tablet Commonly known as:  PLAVIX Take 75 mg by mouth daily.   docusate sodium 100 MG capsule Commonly known as:  COLACE Take 100 mg by mouth 2 (two) times daily as needed. As needed.   donepezil 10 MG tablet Commonly known  as:  ARICEPT Take 10 mg by mouth at bedtime.   escitalopram 10 MG tablet Commonly known as:  LEXAPRO Take 10 mg by mouth daily.   fluticasone 50 MCG/ACT nasal spray Commonly known as:  FLONASE Place 2 sprays into both nostrils daily.   glipiZIDE 2.5 MG 24 hr tablet Commonly known as:  GLUCOTROL XL Take 1 tablet (2.5 mg total) by mouth daily with breakfast.   latanoprost 0.005 % ophthalmic solution Commonly  known as:  XALATAN Place 1 drop into the right eye at bedtime.   LORazepam 0.5 MG tablet Commonly known as:  ATIVAN Take 0.5 mg by mouth every 8 (eight) hours.   meclizine 12.5 MG tablet Commonly known as:  ANTIVERT Take 12.5 mg by mouth 3 (three) times daily as needed for dizziness.   montelukast 10 MG tablet Commonly known as:  SINGULAIR Take 10 mg by mouth daily.   nitroGLYCERIN 0.4 MG SL tablet Commonly known as:  NITROSTAT Place 0.4 mg under the tongue every 5 (five) minutes as needed. For chest pain. Give up to 3 doses every 5 minutes as needed   OSTEO BI-FLEX TRIPLE STRENGTH Tabs Take 1 tablet by mouth 2 (two) times daily.   pantoprazole 40 MG tablet Commonly known as:  PROTONIX Take 40 mg by mouth daily.   simvastatin 20 MG tablet Commonly known as:  ZOCOR Take 20 mg by mouth daily.   SYSTANE 0.4-0.3 % Soln Generic drug:  Polyethyl Glycol-Propyl Glycol Place 1 drop into both eyes 2 (two) times daily.   traMADol 50 MG tablet Commonly known as:  ULTRAM Take 50-100 mg by mouth every 4 (four) hours as needed.       Review of Systems  Constitutional: Negative for activity change, appetite change, chills, diaphoresis and fever.  HENT: Negative for congestion, mouth sores, nosebleeds, postnasal drip, sneezing, sore throat, trouble swallowing and voice change.   Respiratory: Negative for apnea, cough, choking, chest tightness, shortness of breath and wheezing.   Cardiovascular: Negative for chest pain, palpitations and leg swelling.  Gastrointestinal:  Negative for abdominal distention, abdominal pain, constipation, diarrhea and nausea.  Genitourinary: Negative for difficulty urinating, dysuria, frequency and urgency.  Musculoskeletal: Positive for arthralgias (typical arthritis). Negative for back pain, gait problem and myalgias.  Skin: Positive for wound. Negative for color change, pallor and rash.  Neurological: Negative for dizziness, tremors, syncope, speech difficulty, weakness, numbness and headaches.  Psychiatric/Behavioral: Negative for agitation and behavioral problems.  All other systems reviewed and are negative.   Vitals:   05/24/17 1346  BP: (!) 106/58  Pulse: 68  Resp: 20  Temp: 98.2 F (36.8 C)  TempSrc: Oral  SpO2: 99%  Weight: 217 lb (98.4 kg)  Height: 5\' 10"  (1.778 m)   Body mass index is 31.14 kg/m. Physical Exam  Constitutional: He is oriented to person, place, and time. Vital signs are normal. He appears well-developed and well-nourished. He is active and cooperative. He does not appear ill. No distress.  HENT:  Head: Normocephalic and atraumatic.  Mouth/Throat: Uvula is midline, oropharynx is clear and moist and mucous membranes are normal. Mucous membranes are not pale, not dry and not cyanotic.  Eyes: Conjunctivae, EOM and lids are normal. Pupils are equal, round, and reactive to light.  Neck: Trachea normal, normal range of motion and full passive range of motion without pain. Neck supple. No JVD present. No tracheal deviation, no edema and no erythema present. No thyromegaly present.  Cardiovascular: Normal rate, regular rhythm, intact distal pulses and normal pulses. Exam reveals no gallop, no distant heart sounds and no friction rub.  Murmur heard. Pulses:      Dorsalis pedis pulses are 2+ on the right side, and 2+ on the left side.  No edema  Pulmonary/Chest: Effort normal and breath sounds normal. No accessory muscle usage. No respiratory distress. He has no decreased breath sounds. He has no  wheezes. He has no rhonchi. He has no rales. He exhibits no  tenderness.  Abdominal: Soft. Normal appearance and bowel sounds are normal. He exhibits no distension and no ascites. There is no tenderness.  Musculoskeletal: He exhibits no edema.       Left hip: He exhibits decreased range of motion, decreased strength, tenderness and laceration.  Expected osteoarthritis, stiffness; Bilateral Calves soft, supple. Negative Homan's Sign. B- pedal pulses equal  Neurological: He is alert and oriented to person, place, and time. He has normal strength.  Skin: Skin is warm and dry. Laceration noted. He is not diaphoretic. No cyanosis. No pallor. Nails show no clubbing.  Psychiatric: He has a normal mood and affect. His speech is normal and behavior is normal. Judgment and thought content normal. Cognition and memory are normal.  Nursing note and vitals reviewed.   Labs reviewed: Basic Metabolic Panel: Recent Labs    12/15/16 0502 12/30/16 1223 04/24/17 0946  NA 138 138 139  K 3.9 4.5 3.5  CL 104 104 104  CO2 28 29 26   GLUCOSE 135* 97 110*  BUN 16 20 14   CREATININE 1.00 1.29* 0.95  CALCIUM 8.9 9.0 9.0   Liver Function Tests: Recent Labs    12/30/16 1223 04/24/17 0946  AST 17 20  ALT 18 16*  ALKPHOS 83 84  BILITOT 0.8 1.0  PROT 7.1 7.7  ALBUMIN 4.0 4.2   No results for input(s): LIPASE, AMYLASE in the last 8760 hours. No results for input(s): AMMONIA in the last 8760 hours. CBC: Recent Labs    12/15/16 0502 12/30/16 1223 04/24/17 0946 05/08/17 0926  WBC 6.5 6.7 6.1  --   NEUTROABS  --  3.8 3.8  --   HGB 16.7 16.7 16.8 12.0*  HCT 47.9 48.5 51.3 35.6*  MCV 93.6 95.2 95.6  --   PLT 178 173 181  --    Cardiac Enzymes: Recent Labs    06/16/16 1550 07/27/16 1623 12/13/16 0628  TROPONINI <0.03 <0.03 <0.03   BNP: Invalid input(s): POCBNP CBG: Recent Labs    05/07/17 2106 05/08/17 0732 05/08/17 1204  GLUCAP 136* 129* 128*    Procedures and Imaging Studies During  Stay: Dg Hip Port Unilat With Pelvis 1v Left  Result Date: 05/06/2017 CLINICAL DATA:  Left total hip arthroplasty. EXAM: DG HIP (WITH OR WITHOUT PELVIS) 1V PORT LEFT COMPARISON:  Radiographs 12/30/2016. FINDINGS: Status post left total hip arthroplasty. The hardware appears well positioned. There is a cerclage wire surrounding the trochanteric region. No underlying displaced fracture or dislocation identified. Skin staples and surgical drains are in place. IMPRESSION: No demonstrated complication following left total hip arthroplasty. Electronically Signed   By: Richardean Sale M.D.   On: 05/06/2017 12:29    Assessment/Plan:   1.  Primary osteoarthritis of left hip 2.  Status post total replacement of left hip  Continue PT/OT  Continue exercises as taught by PT/OT  Continue ice pack to the hip as needed for pain or edema  Skin/incision care per protocol  Continue Tylenol 1000 mg p.o. 3 times daily scheduled for pain  Continue tramadol 50 mg 1-2 tablets p.o. every 4 hours as needed pain #30, no refills  Continue TED hose-on in the early a.m., off in the p.m.  Follow-up with orthopedist as instructed for continuity of care   Patient is being discharged with the following home health services: Home health PT/OT/SLP/nursing through [facility]  Patient is being discharged with the following durable medical equipment: None  Patient has been advised to f/u with their PCP in 1-2  weeks to bring them up to date on their rehab stay.  Social services at facility was responsible for arranging this appointment.  Pt was provided with a 30 day supply of prescriptions for medications and refills must be obtained from their PCP.  For controlled substances, a more limited supply may be provided adequate until PCP appointment only.  Future labs/tests needed:    Family/ staff Communication:   Total Time:  Documentation:  Face to Face:  Family/Phone:  Vikki Ports,  NP-C [facility] [address], [place] [address] Cell Phone (Mon-Fri 8am-5pm):  [phone] On Call:  [phone] & follow prompts after 5pm & weekends Office Phone:  [phone] Office Fax:  [phone]

## 2017-06-13 DIAGNOSIS — M1991 Primary osteoarthritis, unspecified site: Secondary | ICD-10-CM | POA: Insufficient documentation

## 2017-06-20 ENCOUNTER — Ambulatory Visit: Payer: Medicare Other | Admitting: Podiatry

## 2017-07-16 DIAGNOSIS — I712 Thoracic aortic aneurysm, without rupture: Secondary | ICD-10-CM | POA: Insufficient documentation

## 2017-07-16 DIAGNOSIS — I7121 Aneurysm of the ascending aorta, without rupture: Secondary | ICD-10-CM | POA: Insufficient documentation

## 2018-01-07 ENCOUNTER — Other Ambulatory Visit: Payer: Self-pay | Admitting: Nurse Practitioner

## 2018-01-07 DIAGNOSIS — I712 Thoracic aortic aneurysm, without rupture, unspecified: Secondary | ICD-10-CM

## 2018-01-20 ENCOUNTER — Ambulatory Visit
Admission: RE | Admit: 2018-01-20 | Discharge: 2018-01-20 | Disposition: A | Discharge status: Home / Self Care | Payer: Medicare Other | Source: Ambulatory Visit | Attending: Nurse Practitioner | Admitting: Nurse Practitioner

## 2018-01-20 DIAGNOSIS — I712 Thoracic aortic aneurysm, without rupture, unspecified: Secondary | ICD-10-CM

## 2018-01-20 DIAGNOSIS — I2510 Atherosclerotic heart disease of native coronary artery without angina pectoris: Secondary | ICD-10-CM | POA: Insufficient documentation

## 2018-01-20 DIAGNOSIS — J439 Emphysema, unspecified: Secondary | ICD-10-CM | POA: Diagnosis not present

## 2018-01-20 DIAGNOSIS — I700 Atherosclerosis of aorta: Secondary | ICD-10-CM | POA: Diagnosis not present

## 2018-01-20 HISTORY — DX: Heart failure, unspecified: I50.9

## 2018-01-20 MED ORDER — IOPAMIDOL (ISOVUE-370) INJECTION 76%
75.0000 mL | Freq: Once | INTRAVENOUS | Status: AC | PRN
  Administered 2018-01-20: 75 mL via INTRAVENOUS

## 2018-03-12 ENCOUNTER — Ambulatory Visit (INDEPENDENT_AMBULATORY_CARE_PROVIDER_SITE_OTHER): Payer: Self-pay | Admitting: Urology

## 2018-03-12 ENCOUNTER — Encounter: Payer: Self-pay | Admitting: Urology

## 2018-03-12 VITALS — BP 121/68 | HR 71 | Ht 71.0 in | Wt 226.0 lb

## 2018-03-12 DIAGNOSIS — Z8546 Personal history of malignant neoplasm of prostate: Secondary | ICD-10-CM

## 2018-03-12 NOTE — Progress Notes (Signed)
03/12/2018 11:21 AM   Darrell Clark 10-04-1932 [id number]  Referring provider: Maryland Pink, MD [address] [facility] [place], [place] [address]  Chief Complaint  Patient presents with  . Establish Care    HPI: 84 year old male presents to establish local urologic care.  He has been followed by Dr. Jacqlyn Larsen in [place] and [place].  He last saw him in June 2019.  He was diagnosed with prostate cancer in February 2011 and underwent radiation therapy.  PSA and Gleason score at the time of diagnosis is not mentioned and available records for review.  His PSA June 2019 was stable at 0.58.  Dr. Jacqlyn Larsen and recommended a one-year follow-up in this appointment was made early.  He has no bothersome lower urinary tract symptoms.  He he denies dysuria or gross hematuria.  He has no flank, abdominal, pelvic or scrotal pain.   PMH: Past Medical History:  Diagnosis Date  . Acquired cyst of kidney   . Allergy   . Anxiety   . Arthritis   . Atherosclerosis   . BPH (benign prostatic hyperplasia)   . CAD (coronary artery disease)   . Cardiac arrhythmia   . CHF (congestive heart failure) ([place])   . Chronic prostatitis   . Degenerative arthritis   . Dementia    mild  . Depression   . Diabetes mellitus without complication ([place])   . Dyspnea    with exertion  . Elevated prostate specific antigen (PSA)   . Encysted hydrocele   . GERD (gastroesophageal reflux disease)   . Glaucoma (increased eye pressure)   . Gout   . Gross hematuria   . Hemorrhoids   . HLD (hyperlipidemia)   . Hyperlipidemia, unspecified   . Hypertension   . Hypertrophy of breast   . Impotence of organic origin   . Incomplete bladder emptying   . Malignant neoplasm of prostate ([place])   . Osteoarthritis   . Other specified disorders of the male genital organs   . Prostate cancer ([place])   . PVD (peripheral vascular disease) ([place])   . Sciatica   . Spermatocele   . Spinal stenosis of lumbar region with  radiculopathy 11/11/2015  . Stroke ([place])   . Testicular hypofunction    other  . Urinary frequency   . Urinary obstruction    not elsewhere classified  . Vertigo     Surgical History: Past Surgical History:  Procedure Laterality Date  . APPENDECTOMY    . CARDIAC CATHETERIZATION  11/2013  . FIDUCIAL SEED PLACEMENT  10/14/2009  . PROSTATE BIOPSY  08/11/2009  . RADIATION FOR PROSTATE CANCER    . TOTAL HIP ARTHROPLASTY Left 05/06/2017   Procedure: TOTAL HIP ARTHROPLASTY;  Surgeon: Dereck Leep, MD;  Location: [facility] ORS;  Service: Orthopedics;  Laterality: Left;    Home Medications:  Allergies as of 03/12/2018      Reactions   Niacin Itching, Rash   Other reaction(s): Unknown Other reaction(s): UNKNOWN   Niacin And Related Rash      Medication List        Accurate as of 03/12/18 11:21 AM. Always use your most recent med list.          acetaminophen 500 MG tablet Commonly known as:  TYLENOL Take 1,000 mg by mouth 3 (three) times daily.   amLODipine 5 MG tablet Commonly known as:  NORVASC Take 5 mg by mouth daily.   aspirin 81 MG EC tablet Take 1 tablet (81 mg  total) by mouth daily.   benazepril 20 MG tablet Commonly known as:  LOTENSIN Take 20 mg by mouth daily.   busPIRone 15 MG tablet Commonly known as:  BUSPAR Take by mouth.   cetirizine 10 MG tablet Commonly known as:  ZYRTEC Take by mouth.   clopidogrel 75 MG tablet Commonly known as:  PLAVIX Take 75 mg by mouth daily.   docusate sodium 100 MG capsule Commonly known as:  COLACE Take 100 mg by mouth 2 (two) times daily as needed. As needed.   donepezil 10 MG tablet Commonly known as:  ARICEPT Take 10 mg by mouth at bedtime.   escitalopram 10 MG tablet Commonly known as:  LEXAPRO Take 10 mg by mouth daily.   fluticasone 50 MCG/ACT nasal spray Commonly known as:  FLONASE Place 2 sprays into both nostrils daily.   glipiZIDE 2.5 MG 24 hr tablet Commonly known as:  GLUCOTROL XL Take 1  tablet (2.5 mg total) by mouth daily with breakfast.   isosorbide mononitrate 30 MG 24 hr tablet Commonly known as:  IMDUR Take by mouth.   latanoprost 0.005 % ophthalmic solution Commonly known as:  XALATAN Place 1 drop into the right eye at bedtime.   lisinopril 20 MG tablet Commonly known as:  PRINIVIL,ZESTRIL Take by mouth.   LORazepam 0.5 MG tablet Commonly known as:  ATIVAN Take 0.5 mg by mouth every 8 (eight) hours.   meclizine 12.5 MG tablet Commonly known as:  ANTIVERT Take 12.5 mg by mouth 3 (three) times daily as needed for dizziness.   montelukast 10 MG tablet Commonly known as:  SINGULAIR Take 10 mg by mouth daily.   nabumetone 750 MG tablet Commonly known as:  RELAFEN nabumetone 750 mg tablet   nitroGLYCERIN 0.4 MG SL tablet Commonly known as:  NITROSTAT Place 0.4 mg under the tongue every 5 (five) minutes as needed. For chest pain. Give up to 3 doses every 5 minutes as needed   ONE TOUCH ULTRA TEST test strip Generic drug:  glucose blood TEST TWICE DAILY.   ONETOUCH DELICA LANCETS 09B Misc   OSTEO BI-FLEX TRIPLE STRENGTH Tabs Take 1 tablet by mouth 2 (two) times daily.   pantoprazole 40 MG tablet Commonly known as:  PROTONIX Take 40 mg by mouth daily.   simvastatin 20 MG tablet Commonly known as:  ZOCOR Take 20 mg by mouth daily.   SYSTANE 0.4-0.3 % Soln Generic drug:  Polyethyl Glycol-Propyl Glycol Place 1 drop into both eyes 2 (two) times daily.   traMADol 50 MG tablet Commonly known as:  ULTRAM Take 50-100 mg by mouth every 4 (four) hours as needed.   triamcinolone 55 MCG/ACT Aero nasal inhaler Commonly known as:  NASACORT Place into the nose.       Allergies:  Allergies  Allergen Reactions  . Niacin Itching and Rash    Other reaction(s): Unknown Other reaction(s): UNKNOWN  . Niacin And Related Rash    Family History: Family History  Problem Relation Age of Onset  . Heart attack Mother   . Hypertension Mother   . Heart  attack Father   . Hypertension Father   . Hypertension Sister   . Cancer Brother   . Hypertension Brother   . Hypertension Brother   . Hypertension Brother   . Hypertension Sister   . Kidney cancer Neg Hx   . Prostate cancer Neg Hx   . GU problems Neg Hx     Social History:  reports that he quit  smoking about 10 years ago. His smoking use included cigarettes. He has a 50.00 pack-year smoking history. He has never used smokeless tobacco. He reports that he does not drink alcohol or use drugs.  ROS: UROLOGY Frequent Urination?: Yes Hard to postpone urination?: No Burning/pain with urination?: No Get up at night to urinate?: Yes Leakage of urine?: Yes Urine stream starts and stops?: No Trouble starting stream?: No Do you have to strain to urinate?: No Blood in urine?: No Urinary tract infection?: No Sexually transmitted disease?: No Injury to kidneys or bladder?: No Painful intercourse?: No Weak stream?: Yes Erection problems?: Yes Penile pain?: No  Gastrointestinal Nausea?: No Vomiting?: No Indigestion/heartburn?: No Diarrhea?: No Constipation?: No  Constitutional Fever: No Night sweats?: No Weight loss?: No Fatigue?: No  Skin Skin rash/lesions?: No Itching?: No  Eyes Blurred vision?: No Double vision?: No  Ears/Nose/Throat Sore throat?: No Sinus problems?: No  Hematologic/Lymphatic Swollen glands?: No Easy bruising?: No  Cardiovascular Leg swelling?: No Chest pain?: No  Respiratory Cough?: No Shortness of breath?: No  Endocrine Excessive thirst?: No  Musculoskeletal Back pain?: Yes Joint pain?: Yes  Neurological Headaches?: No Dizziness?: No  Psychologic Depression?: No Anxiety?: No  Physical Exam: BP 121/68 (BP Location: Left Arm, Patient Position: Sitting, Cuff Size: Large)   Pulse 71   Ht 5\' 11"  (1.803 m)   Wt 226 lb (102.5 kg)   BMI 31.52 kg/m   Constitutional:  Alert and oriented, No acute distress. HEENT: [place] AT, moist  mucus membranes.  Trachea midline, no masses. Cardiovascular: No clubbing, cyanosis, or edema. Respiratory: Normal respiratory effort, no increased work of breathing. GI: Abdomen is soft, nontender, nondistended, no abdominal masses GU: No CVA tenderness Lymph: No cervical or inguinal lymphadenopathy. Skin: No rashes, bruises or suspicious lesions. Neurologic: Grossly intact, no focal deficits, moving all 4 extremities. Psychiatric: Normal mood and affect.   Assessment & Plan:   84 year old male with a history of prostate cancer status post radiation completed in 2011.  His PSA has remained low.  Follow-up 1 year.  Return in about 1 year (around 03/13/2019) for Recheck.   Abbie Sons, [place] [address] [place], [place] [address] [phone]

## 2018-07-07 ENCOUNTER — Encounter: Payer: Self-pay | Admitting: Podiatry

## 2018-07-07 ENCOUNTER — Ambulatory Visit: Payer: Medicare Other | Admitting: Podiatry

## 2018-07-07 DIAGNOSIS — E1142 Type 2 diabetes mellitus with diabetic polyneuropathy: Secondary | ICD-10-CM

## 2018-07-07 DIAGNOSIS — B351 Tinea unguium: Secondary | ICD-10-CM

## 2018-07-07 DIAGNOSIS — M79676 Pain in unspecified toe(s): Secondary | ICD-10-CM

## 2018-07-07 NOTE — Progress Notes (Signed)
He presents today chief complaint of painful elongated toenails.  Objective: Toenails are long thick yellow dystrophic onychomycotic painful palpation.  Assessment: Pain in limb secondary to onychomycosis diabetes and painful elongated toenails.  Plan: Debridement of toenails 1 through 5 bilateral.

## 2018-07-12 ENCOUNTER — Emergency Department
Admission: EM | Admit: 2018-07-12 | Discharge: 2018-07-12 | Disposition: A | Discharge status: Home / Self Care | Payer: Medicare Other | Attending: Emergency Medicine | Admitting: Emergency Medicine

## 2018-07-12 ENCOUNTER — Other Ambulatory Visit: Payer: Self-pay

## 2018-07-12 ENCOUNTER — Emergency Department: Payer: Medicare Other

## 2018-07-12 DIAGNOSIS — H539 Unspecified visual disturbance: Secondary | ICD-10-CM | POA: Diagnosis not present

## 2018-07-12 DIAGNOSIS — Z7984 Long term (current) use of oral hypoglycemic drugs: Secondary | ICD-10-CM | POA: Diagnosis not present

## 2018-07-12 DIAGNOSIS — F0390 Unspecified dementia without behavioral disturbance: Secondary | ICD-10-CM | POA: Diagnosis not present

## 2018-07-12 DIAGNOSIS — R200 Anesthesia of skin: Secondary | ICD-10-CM | POA: Diagnosis present

## 2018-07-12 DIAGNOSIS — Z87891 Personal history of nicotine dependence: Secondary | ICD-10-CM | POA: Diagnosis not present

## 2018-07-12 DIAGNOSIS — I2510 Atherosclerotic heart disease of native coronary artery without angina pectoris: Secondary | ICD-10-CM | POA: Diagnosis not present

## 2018-07-12 DIAGNOSIS — I509 Heart failure, unspecified: Secondary | ICD-10-CM | POA: Insufficient documentation

## 2018-07-12 DIAGNOSIS — Z7982 Long term (current) use of aspirin: Secondary | ICD-10-CM | POA: Insufficient documentation

## 2018-07-12 DIAGNOSIS — E119 Type 2 diabetes mellitus without complications: Secondary | ICD-10-CM | POA: Diagnosis not present

## 2018-07-12 DIAGNOSIS — Z96642 Presence of left artificial hip joint: Secondary | ICD-10-CM | POA: Insufficient documentation

## 2018-07-12 DIAGNOSIS — I110 Hypertensive heart disease with heart failure: Secondary | ICD-10-CM | POA: Insufficient documentation

## 2018-07-12 DIAGNOSIS — Z79899 Other long term (current) drug therapy: Secondary | ICD-10-CM | POA: Diagnosis not present

## 2018-07-12 LAB — CBC WITH DIFFERENTIAL/PLATELET
ABS IMMATURE GRANULOCYTES: 0.03 10*3/uL (ref 0.00–0.07)
BASOS ABS: 0.1 10*3/uL (ref 0.0–0.1)
Basophils Relative: 1 %
EOS ABS: 0.2 10*3/uL (ref 0.0–0.5)
EOS PCT: 3 %
HEMATOCRIT: 50.2 % (ref 39.0–52.0)
HEMOGLOBIN: 16.5 g/dL (ref 13.0–17.0)
Immature Granulocytes: 0 %
LYMPHS ABS: 2.3 10*3/uL (ref 0.7–4.0)
Lymphocytes Relative: 32 %
MCH: 30.7 pg (ref 26.0–34.0)
MCHC: 32.9 g/dL (ref 30.0–36.0)
MCV: 93.3 fL (ref 80.0–100.0)
Monocytes Absolute: 0.8 10*3/uL (ref 0.1–1.0)
Monocytes Relative: 11 %
NRBC: 0 % (ref 0.0–0.2)
Neutro Abs: 3.7 10*3/uL (ref 1.7–7.7)
Neutrophils Relative %: 53 %
Platelets: 183 10*3/uL (ref 150–400)
RBC: 5.38 MIL/uL (ref 4.22–5.81)
RDW: 14.1 % (ref 11.5–15.5)
WBC: 7.1 10*3/uL (ref 4.0–10.5)

## 2018-07-12 LAB — URINALYSIS, COMPLETE (UACMP) WITH MICROSCOPIC
BACTERIA UA: NONE SEEN
Bilirubin Urine: NEGATIVE
GLUCOSE, UA: NEGATIVE mg/dL
HGB URINE DIPSTICK: NEGATIVE
KETONES UR: NEGATIVE mg/dL
Leukocytes, UA: NEGATIVE
Nitrite: NEGATIVE
PROTEIN: NEGATIVE mg/dL
Specific Gravity, Urine: 1.016 (ref 1.005–1.030)
pH: 5 (ref 5.0–8.0)

## 2018-07-12 LAB — COMPREHENSIVE METABOLIC PANEL
ALBUMIN: 4.3 g/dL (ref 3.5–5.0)
ALT: 14 U/L (ref 0–44)
AST: 22 U/L (ref 15–41)
Alkaline Phosphatase: 80 U/L (ref 38–126)
Anion gap: 7 (ref 5–15)
BUN: 13 mg/dL (ref 8–23)
CALCIUM: 9.3 mg/dL (ref 8.9–10.3)
CO2: 28 mmol/L (ref 22–32)
CREATININE: 1.03 mg/dL (ref 0.61–1.24)
Chloride: 106 mmol/L (ref 98–111)
GFR calc Af Amer: >60 mL/min (ref >60)
GLUCOSE: 92 mg/dL (ref 70–99)
POTASSIUM: 3.6 mmol/L (ref 3.5–5.1)
Sodium: 141 mmol/L (ref 135–145)
Total Bilirubin: 0.9 mg/dL (ref 0.3–1.2)
Total Protein: 8.1 g/dL (ref 6.5–8.1)

## 2018-07-12 NOTE — Consult Note (Signed)
Neurology Consultation Reason for Consult: Visual change Referring Physician: Satira Anis  CC: Facial pain  History is obtained from: Patient, of note the patient is a very poor historian  HPI: Darrell Clark is a 85 y.o. male who comes to the ER complaining of 2 things.  First is an abnormal feeling of his face.  He states that he has a "glittering" sensation around his eyes bilaterally.  He also states that he has a numbness "kind of like a sinus infection."  This is present bilaterally.  When I ask if it is pins-and-needles or burning type sensation, he denies this.  He states that it is more of a dull aching fullness present bilaterally.  I asked him if he had any difficulty with feeling if touched, and he denies this, he also denies any asymmetry in sensation.  His second complaint is that he notes that his vision has been getting dimmer over the past few weeks.  In particular over the past day, he notices that his vision has been dimmer than it was previously.  He does have a history of visual change in his left eye.  He does notice some mild headache which is holosystolic as well, and states that this is typical of headaches that he gets from time to time.     LKW: Unclear, likely 2 weeks ago tpa given?: no, outside of window    ROS: A 14 point ROS was performed and is negative except as noted in the HPI.   Past Medical History:  Diagnosis Date  . Acquired cyst of kidney   . Allergy   . Anxiety   . Arthritis   . Atherosclerosis   . BPH (benign prostatic hyperplasia)   . CAD (coronary artery disease)   . Cardiac arrhythmia   . CHF (congestive heart failure) ([place])   . Chronic prostatitis   . Degenerative arthritis   . Dementia ([place])    mild  . Depression   . Diabetes mellitus without complication ([place])   . Dyspnea    with exertion  . Elevated prostate specific antigen (PSA)   . Encysted hydrocele   . GERD (gastroesophageal reflux disease)   . Glaucoma (increased eye  pressure)   . Gout   . Gross hematuria   . Hemorrhoids   . HLD (hyperlipidemia)   . Hyperlipidemia, unspecified   . Hypertension   . Hypertrophy of breast   . Impotence of organic origin   . Incomplete bladder emptying   . Malignant neoplasm of prostate ([place])   . Osteoarthritis   . Other specified disorders of the male genital organs   . Prostate cancer ([place])   . PVD (peripheral vascular disease) ([place])   . Sciatica   . Spermatocele   . Spinal stenosis of lumbar region with radiculopathy 11/11/2015  . Stroke ([place])   . Testicular hypofunction    other  . Urinary frequency   . Urinary obstruction    not elsewhere classified  . Vertigo      Family History  Problem Relation Age of Onset  . Heart attack Mother   . Hypertension Mother   . Heart attack Father   . Hypertension Father   . Hypertension Sister   . Cancer Brother   . Hypertension Brother   . Hypertension Brother   . Hypertension Brother   . Hypertension Sister   . Kidney cancer Neg Hx   . Prostate cancer Neg Hx   . GU problems Neg Hx  Social History:  reports that he quit smoking about 10 years ago. His smoking use included cigarettes. He has a 50.00 pack-year smoking history. He has never used smokeless tobacco. He reports that he does not drink alcohol or use drugs.   Exam: Current vital signs: BP 128/87   Pulse 69   Temp 98 F (36.7 C) (Oral)   Resp 15   Ht 5\' 11"  (1.803 m)   Wt 102.1 kg   SpO2 97%   BMI 31.38 kg/m  Vital signs in last 24 hours: Temp:  [98 F (36.7 C)] 98 F (36.7 C) (01/25 1356) Pulse Rate:  [69-73] 69 (01/25 1436) Resp:  [15-18] 15 (01/25 1436) BP: (128-144)/(76-87) 128/87 (01/25 1436) SpO2:  [97 %-99 %] 97 % (01/25 1436) Weight:  [102.1 kg] 102.1 kg (01/25 1333)   Physical Exam  Constitutional: Appears well-developed and well-nourished.  Psych: Affect appropriate to situation Eyes: No scleral injection HENT: No OP obstrucion Head: Normocephalic.   Cardiovascular: Normal rate and regular rhythm.  Respiratory: Effort normal, non-labored breathing GI: Soft.  No distension. There is no tenderness.  Skin: WDI  Neuro: Mental Status: Patient is awake, alert, oriented to person, place, month, year, and situation. Patient is able to give a clear and coherent history. No signs of aphasia or neglect Cranial Nerves: II: Visual fields are full in his right eye, and his left eye he does have some difficulty with counting fingers, but is able to correctly identify finger movement.  Pupils are equal, round, and reactive to light.   III,IV, VI: EOMI without ptosis or diploplia.  V: Facial sensation is symmetric to temperature VII: Facial movement is symmetric.  VIII: hearing is intact to voice X: Uvula elevates symmetrically XI: Shoulder shrug is symmetric. XII: tongue is midline without atrophy or fasciculations.  Motor: Tone is normal. Bulk is normal. 5/5 strength was present in all four extremities.  Sensory: Sensation is symmetric to light touch and temperature in the arms and legs. Cerebellar: FNF  intact bilaterally   I have reviewed labs in epic and the results pertinent to this consultation are: CMP and CBC were unremarkable  I have reviewed the images obtained: CT head-unremarkable  Impression: 85 year old male with bilateral atypical facial pain and very nonspecific visual change.  At this time, I wonder if he may have had some upper respiratory infection causing sinus pain.  I do think that with the difficulty getting the history, and the changing story, that an MRI of the brain would be prudent.  Given the time that he has had symptoms, if this is negative, I do not think I would  characterize this as TIA.  Recommendations: 1) MRI brain 2) if negative, would follow-up with PCP and ophthalmology   Roland Rack, MD [facility] [phone]  If 7pm- 7am, please page neurology on call as listed in  [address].

## 2018-07-12 NOTE — ED Triage Notes (Signed)
Pt to ED via POV. Pt states 2 days ago he started having trouble seeing, pt states it seems dark, over the past two days it was progressively gotten worse with feelings of numbness in his face and body bilaterally.

## 2018-07-12 NOTE — ED Provider Notes (Signed)
----------------------------------------- 5:54 PM on 07/12/2018 -----------------------------------------   Blood pressure 128/87, pulse 69, temperature 98 F (36.7 C), temperature source Oral, resp. rate 15, height 5\' 11"  (1.803 m), weight 102.1 kg, SpO2 97 %.  Assuming care from Dr. Cinda Quest of [place] Darrell Clark is a 85 y.o. male with a chief complaint of Numbness .    Please refer to H&P by previous MD for further details.  The current plan of care is to f/u results of MRI and if negative will dc home with f/u with ophthalmologist per Dr. Cecil Cobbs recommendations.   I have personally reviewed the images performed during this visit and I agree with the Radiologist's read.   Interpretation by Radiologist:  Ct Head Wo Contrast  Result Date: 07/12/2018 CLINICAL DATA:  Difficulty seeing for several days EXAM: CT HEAD WITHOUT CONTRAST TECHNIQUE: Contiguous axial images were obtained from the base of the skull through the vertex without intravenous contrast. COMPARISON:  12/13/2016 FINDINGS: Brain: Mild atrophic changes are noted commenced with the patient's given age. No findings to suggest acute hemorrhage, acute infarction or space-occupying mass lesion are seen. Lacunar infarct in the right thalamus is noted and slightly progressed when compared with the prior exam. Vascular: No hyperdense vessel or unexpected calcification. Skull: Normal. Negative for fracture or focal lesion. Sinuses/Orbits: No acute finding. Other: None. IMPRESSION: Chronic atrophic changes. Old lacunar infarct in the right thalamus. No acute abnormality is noted. Electronically Signed   By: Inez Catalina M.D.   On: 07/12/2018 14:18   Mr Brain Wo Contrast  Result Date: 07/12/2018 CLINICAL DATA:  Confusion. Visual disturbance. Duration of symptoms 3 days. EXAM: MRI HEAD WITHOUT CONTRAST TECHNIQUE: Multiplanar, multiecho pulse sequences of the brain and surrounding structures were obtained without intravenous contrast.  COMPARISON:  CT same day.  MRI 12/13/2016. FINDINGS: Brain: There is considerable artifact anteriorly. Diffusion imaging does not show any acute or subacute infarction. No brainstem or cerebellar insult is identified. There are old small vessel infarctions in the right thalamus. There are moderate chronic small-vessel ischemic changes of the cerebral hemispheric deep and subcortical white matter. No mass lesion, hemorrhage, hydrocephalus or extra-axial collection. Vascular: Vessels at the base of the brain not well evaluated because of the artifact. Skull and upper cervical spine: Negative Sinuses/Orbits: Sinuses not well evaluated because of the artifact. Orbits not well evaluated. Other: None IMPRESSION: No acute finding. Old small vessel infarctions right thalamus. Chronic small-vessel ischemic changes of the cerebral hemispheric white matter. Examination limited by extensive artifact from the region of the face or mouth. Electronically Signed   By: Nelson Chimes M.D.   On: 07/12/2018 17:32   Dg Chest Portable 1 View  Result Date: 07/12/2018 CLINICAL DATA:  85 year old male with a history of vision problems EXAM: PORTABLE CHEST 1 VIEW COMPARISON:  CT chest 01/20/2018, chest x-ray 12/13/2016 FINDINGS: Cardiomediastinal silhouette unchanged in size and contour. No evidence of central vascular congestion. No pleural effusion. No pneumothorax. Similar appearance of linear opacity at the left lung base at the left heart border. No confluent airspace disease. No displaced fracture IMPRESSION: Chronic lung changes without evidence of acute cardiopulmonary disease Electronically Signed   By: Corrie Mckusick D.O.   On: 07/12/2018 14:39      MRI negative for acute stroke.  Patient remains neurologically intact.  Will discharge home with follow-up at [facility] for further evaluation of his visual changes per neurology recommendation.  Discussed return precautions for any signs of stroke.     [place], [place],  MD 07/12/18 1755

## 2018-07-12 NOTE — Discharge Instructions (Signed)
Your MRI was negative for any stroke.  Follow-up with [facility] for further evaluation of your visual changes.  Return to the emergency room for facial droop, slurred speech, difficulty finding words, visual loss, or one side of your body is weaker or numb than the other.

## 2018-07-12 NOTE — ED Provider Notes (Signed)
[facility] Emergency Department Provider Note   ____________________________________________   First MD Initiated Contact with Patient 07/12/18 1335     (approximate)  I have reviewed the triage vital signs and the nursing notes.   HISTORY  Chief Complaint Numbness    HPI Darrell Clark is a 85 y.o. male patient reports left-sided facial numbness and decreased in vision starting yesterday.  Today he had more decrease in vision and was ataxic when walking which is apparently new.  He reports in the emergency room that the numbness in his face is now gone.  Is not had any weakness or numbness elsewhere.  Past Medical History:  Diagnosis Date  . Acquired cyst of kidney   . Allergy   . Anxiety   . Arthritis   . Atherosclerosis   . BPH (benign prostatic hyperplasia)   . CAD (coronary artery disease)   . Cardiac arrhythmia   . CHF (congestive heart failure) ([place])   . Chronic prostatitis   . Degenerative arthritis   . Dementia ([place])    mild  . Depression   . Diabetes mellitus without complication ([place])   . Dyspnea    with exertion  . Elevated prostate specific antigen (PSA)   . Encysted hydrocele   . GERD (gastroesophageal reflux disease)   . Glaucoma (increased eye pressure)   . Gout   . Gross hematuria   . Hemorrhoids   . HLD (hyperlipidemia)   . Hyperlipidemia, unspecified   . Hypertension   . Hypertrophy of breast   . Impotence of organic origin   . Incomplete bladder emptying   . Malignant neoplasm of prostate ([place])   . Osteoarthritis   . Other specified disorders of the male genital organs   . Prostate cancer ([place])   . PVD (peripheral vascular disease) ([place])   . Sciatica   . Spermatocele   . Spinal stenosis of lumbar region with radiculopathy 11/11/2015  . Stroke ([place])   . Testicular hypofunction    other  . Urinary frequency   . Urinary obstruction    not elsewhere classified  . Vertigo     Patient Active Problem List     Diagnosis Date Noted  . Ascending aortic aneurysm ([place]) 07/16/2017  . Primary osteoarthritis 06/13/2017  . Acute postoperative pain of left hip 05/13/2017  . Status post total replacement of hip 05/06/2017  . Lumbar radiculopathy, acute 01/01/2017  . Generalized weakness 01/01/2017  . Current moderate episode of major depressive disorder without prior episode ([place]) 12/18/2016  . Major depression in remission ([place]) 12/18/2016  . Dizziness 12/13/2016  . Inguinal hernia of left side without obstruction or gangrene 09/13/2016  . SOBOE (shortness of breath on exertion) 08/03/2016  . Bilateral carotid artery stenosis 08/03/2016  . Vision, loss, sudden 04/16/2016  . 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 Intracranial vascular stenosis 04/16/2016  . CVA (cerebral vascular accident) ([place]) 04/15/2016  . HTN (hypertension) 04/15/2016  . Diabetes ([place]) 04/15/2016  . GERD (gastroesophageal reflux disease) 04/15/2016  . HLD (hyperlipidemia) 04/15/2016  . Primary osteoarthritis of both hips 09/24/2015  . Moderate tricuspid insufficiency 01/10/2015  . Moderate mitral insufficiency 08/31/2014  . Mild aortic valve stenosis 08/31/2014  . CAD (coronary artery disease) 08/17/2014  . Spermatocele 04/21/2012  . Sciatica 04/21/2012  . Personal history of prostate cancer 04/21/2012  . Increased frequency of urination 04/21/2012  . Incomplete emptying of bladder 04/21/2012  . Encysted hydrocele 04/21/2012  .  ED (erectile dysfunction) of organic origin 04/21/2012    Past Surgical History:  Procedure Laterality Date  . APPENDECTOMY    . CARDIAC CATHETERIZATION  11/2013  . FIDUCIAL SEED PLACEMENT  10/14/2009  . PROSTATE BIOPSY  08/11/2009  . RADIATION FOR PROSTATE CANCER    . TOTAL HIP ARTHROPLASTY Left 05/06/2017   Procedure: TOTAL HIP ARTHROPLASTY;  Surgeon: Dereck Leep, MD;  Location: [facility] ORS;  Service: Orthopedics;  Laterality: Left;     Prior to Admission medications   Medication Sig Start Date End Date Taking? Authorizing Provider  acetaminophen (TYLENOL) 500 MG tablet Take 1,000 mg by mouth 3 (three) times daily.    Yes [provider]  amLODipine (NORVASC) 5 MG tablet Take 5 mg by mouth daily.  02/21/13  Yes [provider]  aspirin EC 81 MG EC tablet Take 1 tablet (81 mg total) by mouth daily. 04/16/16  Yes Theodoro Grist, MD  benazepril (LOTENSIN) 20 MG tablet Take 20 mg by mouth daily.    Yes [provider]  brimonidine (ALPHAGAN) 0.2 % ophthalmic solution Place 1 drop into both eyes daily. 07/10/18  Yes [provider]  Brinzolamide-Brimonidine (SIMBRINZA) 1-0.2 % SUSP Apply 1 drop to eye daily.   Yes [provider]  donepezil (ARICEPT) 10 MG tablet Take 10 mg by mouth at bedtime.  02/21/13  Yes [provider]  glipiZIDE (GLUCOTROL XL) 2.5 MG 24 hr tablet Take 1 tablet (2.5 mg total) by mouth daily with breakfast. 04/16/16  Yes Theodoro Grist, MD  latanoprost (XALATAN) 0.005 % ophthalmic solution Place 1 drop into the right eye at bedtime. 12/17/16  Yes Dustin Flock, MD  LORazepam (ATIVAN) 0.5 MG tablet Take 0.5 mg by mouth every 8 (eight) hours.   Yes [provider]  Misc Natural Products (OSTEO BI-FLEX TRIPLE STRENGTH) TABS Take 1 tablet by mouth 2 (two) times daily.    Yes [provider]  triamcinolone (NASACORT) 55 MCG/ACT AERO nasal inhaler Place into the nose. 02/10/18 02/10/19 Yes [provider]  docusate sodium (COLACE) 100 MG capsule Take 100 mg by mouth 2 (two) times daily as needed. As needed.     [provider]  glucose blood (ONE TOUCH ULTRA TEST) test strip TEST TWICE DAILY. 07/25/17   [provider]  meclizine (ANTIVERT) 12.5 MG tablet Take 12.5 mg by mouth 3 (three) times daily as needed for dizziness.    [provider]  nitroGLYCERIN (NITROSTAT) 0.4 MG SL tablet Place 0.4 mg under the tongue  every 5 (five) minutes as needed. For chest pain. Give up to 3 doses every 5 minutes as needed 11/20/13   [provider]  ONETOUCH DELICA LANCETS 12W MISC  08/11/12   [provider]  Polyethyl Glycol-Propyl Glycol (SYSTANE) 0.4-0.3 % SOLN Place 1 drop into both eyes 2 (two) times daily.    [provider]    Allergies Niacin and Niacin and related  Family History  Problem Relation Age of Onset  . Heart attack Mother   . Hypertension Mother   . Heart attack Father   . Hypertension Father   . Hypertension Sister   . Cancer Brother   . Hypertension Brother   . Hypertension Brother   . Hypertension Brother   . Hypertension Sister   . Kidney cancer Neg Hx   . Prostate cancer Neg Hx   . GU problems Neg Hx     Social History Social History   Tobacco Use  . Smoking  status: Former Smoker    Packs/day: 1.00    Years: 50.00    Pack years: 50.00    Types: Cigarettes    Last attempt to quit: 11/19/2007    Years since quitting: 10.6  . Smokeless tobacco: Never Used  Substance Use Topics  . Alcohol use: No  . Drug use: No    Review of Systems  Constitutional: No fever/chills Eyes: visual changes. ENT: No sore throat. Cardiovascular: Denies chest pain. Respiratory: Denies shortness of breath. Gastrointestinal: No abdominal pain.  No nausea, no vomiting.  No diarrhea.  No constipation. Genitourinary: Negative for dysuria. Musculoskeletal: Negative for back pain. Skin: Negative for rash. Neurological: Negative for headaches, focal weakness \ ____________________________________________   PHYSICAL EXAM:  VITAL SIGNS: ED Triage Vitals  Enc Vitals Group     BP 07/12/18 1333 (!) 144/76     Pulse Rate 07/12/18 1335 73     Resp 07/12/18 1337 16     Temp 07/12/18 1356 98 F (36.7 C)     Temp Source 07/12/18 1337 Oral     SpO2 07/12/18 1335 99 %     Weight 07/12/18 1333 225 lb (102.1 kg)     Height 07/12/18 1333 5\' 11"  (1.803 m)     Head  Circumference --      Peak Flow --      Pain Score 07/12/18 1333 0     Pain Loc --      Pain Edu? --      Excl. in [place]? --     Constitutional: Alert and oriented. Well appearing and in no acute distress. Eyes: Conjunctivae are normal. PER. EOMI. Head: Atraumatic. Nose: No congestion/rhinnorhea. Mouth/Throat: Mucous membranes are moist.  Oropharynx non-erythematous. Neck: No stridor. Cardiovascular: Normal rate, regular rhythm. Grossly normal heart sounds.  Good peripheral circulation. Respiratory: Normal respiratory effort.  No retractions. Lungs CTAB. Gastrointestinal: Soft and nontender. No distention. No abdominal bruits. No CVA tenderness. Musculoskeletal: No lower extremity tenderness nor edema.  No joint effusions. Neurologic:  Normal speech and language. No gross focal neurologic deficits are appreciated.  Patient's gait is very ataxic.  Cranial nerves II through XII appear to be intact except for decreased vision that patient reports is in his left eye improves when he closes his left eye.  Reports is just blurry he can see but he could not recognize me if he knew me.  He has just a little bit of past-pointing on both sides finger-nose rapid alternating movements is intact in the hands and appears to be intact in both legs.  Motor strength is 5/5 throughout.  Patient denies any numbness at present. Skin:  Skin is warm, dry and intact. No rash noted. Psychiatric: Mood and affect are normal. Speech and behavior are normal.  ____________________________________________   LABS (all labs ordered are listed, but only abnormal results are displayed)  Labs Reviewed  COMPREHENSIVE METABOLIC PANEL  CBC WITH DIFFERENTIAL/PLATELET  URINALYSIS, COMPLETE (UACMP) WITH MICROSCOPIC   ____________________________________________  EKG  EKG read and interpreted by me shows normal sinus rhythm rate of 76 left axis no acute ST-T wave  changes ____________________________________________  RADIOLOGY  ED MD interpretation:   Official radiology report(s): Ct Head Wo Contrast  Result Date: 07/12/2018 CLINICAL DATA:  Difficulty seeing for several days EXAM: CT HEAD WITHOUT CONTRAST TECHNIQUE: Contiguous axial images were obtained from the base of the skull through the vertex without intravenous contrast. COMPARISON:  12/13/2016 FINDINGS: Brain: Mild atrophic changes are noted commenced with the patient's given  age. No findings to suggest acute hemorrhage, acute infarction or space-occupying mass lesion are seen. Lacunar infarct in the right thalamus is noted and slightly progressed when compared with the prior exam. Vascular: No hyperdense vessel or unexpected calcification. Skull: Normal. Negative for fracture or focal lesion. Sinuses/Orbits: No acute finding. Other: None. IMPRESSION: Chronic atrophic changes. Old lacunar infarct in the right thalamus. No acute abnormality is noted. Electronically Signed   By: Inez Catalina M.D.   On: 07/12/2018 14:18   Dg Chest Portable 1 View  Result Date: 07/12/2018 CLINICAL DATA:  85 year old male with a history of vision problems EXAM: PORTABLE CHEST 1 VIEW COMPARISON:  CT chest 01/20/2018, chest x-ray 12/13/2016 FINDINGS: Cardiomediastinal silhouette unchanged in size and contour. No evidence of central vascular congestion. No pleural effusion. No pneumothorax. Similar appearance of linear opacity at the left lung base at the left heart border. No confluent airspace disease. No displaced fracture IMPRESSION: Chronic lung changes without evidence of acute cardiopulmonary disease Electronically Signed   By: Corrie Mckusick D.O.   On: 07/12/2018 14:39    ____________________________________________   PROCEDURES  Procedure(s) performed:   Procedures  Critical Care performed:  ____________________________________________   INITIAL IMPRESSION / [place] / ED [place]  neurology sees the patient.  He cannot get a reliable enough history to admit the patient recommends doing an MRI and pacing admission or discharge based on that.  If the MRI is negative he recommends follow-up with ophthalmology to evaluate his vision.  I will sign the patient out to Dr. Micheline Chapman.      Clinical Course as of Jul 13 1531  Sat Jul 12, 2018  1451 MCHC: 32.9 [PM]    Clinical Course User Index [PM] Nena Polio, MD     ____________________________________________   FINAL CLINICAL IMPRESSION(S) / ED DIAGNOSES  Final diagnoses:  Visual changes     ED Discharge Orders    None       Note:  This document was prepared using Dragon voice recognition software and may include unintentional dictation errors.    Nena Polio, MD 07/12/18 1534

## 2018-07-14 LAB — GLUCOSE, CAPILLARY: Glucose-Capillary: 85 mg/dL (ref 70–99)

## 2018-10-13 ENCOUNTER — Ambulatory Visit: Payer: Medicare Other | Admitting: Podiatry

## 2018-10-16 ENCOUNTER — Ambulatory Visit: Payer: Medicare Other | Admitting: Podiatry

## 2018-10-16 ENCOUNTER — Ambulatory Visit (INDEPENDENT_AMBULATORY_CARE_PROVIDER_SITE_OTHER): Payer: Medicare Other | Admitting: Podiatry

## 2018-10-16 ENCOUNTER — Encounter: Payer: Self-pay | Admitting: Podiatry

## 2018-10-16 ENCOUNTER — Other Ambulatory Visit: Payer: Self-pay

## 2018-10-16 DIAGNOSIS — B351 Tinea unguium: Secondary | ICD-10-CM | POA: Diagnosis not present

## 2018-10-16 DIAGNOSIS — E1142 Type 2 diabetes mellitus with diabetic polyneuropathy: Secondary | ICD-10-CM | POA: Diagnosis not present

## 2018-10-16 DIAGNOSIS — M79676 Pain in unspecified toe(s): Secondary | ICD-10-CM | POA: Diagnosis not present

## 2018-10-16 NOTE — Progress Notes (Signed)
He presents today chief complaint of painful elongated toenails 1 through 5 bilaterally.  Objective: Toenails are thick yellow dystrophic-like mycotic pulses remain palpable no open lesions or wounds.  Assessment: Pain limb secondary to onychomycosis.  Plan: Debridement of toenails 1 through 5 bilateral.

## 2019-01-14 ENCOUNTER — Ambulatory Visit: Payer: Medicare Other | Admitting: Podiatry

## 2019-01-14 ENCOUNTER — Ambulatory Visit (INDEPENDENT_AMBULATORY_CARE_PROVIDER_SITE_OTHER): Payer: Medicare Other | Admitting: Podiatry

## 2019-01-14 ENCOUNTER — Encounter: Payer: Self-pay | Admitting: Podiatry

## 2019-01-14 ENCOUNTER — Other Ambulatory Visit: Payer: Self-pay

## 2019-01-14 VITALS — Temp 97.3°F

## 2019-01-14 DIAGNOSIS — E1142 Type 2 diabetes mellitus with diabetic polyneuropathy: Secondary | ICD-10-CM

## 2019-01-14 DIAGNOSIS — M79676 Pain in unspecified toe(s): Secondary | ICD-10-CM

## 2019-01-14 DIAGNOSIS — B351 Tinea unguium: Secondary | ICD-10-CM

## 2019-01-14 NOTE — Progress Notes (Signed)
He presents today chief complaint of painfully elongated toenails.  Objective: Toenails are long thick yellow dystrophic onychomycotic painful palpation.  Assessment: Pain in limb secondary to onychomycosis.  Plan: Debridement of toenails 1 through 5 bilateral.

## 2019-01-31 ENCOUNTER — Other Ambulatory Visit: Payer: Self-pay | Admitting: Radiology

## 2019-01-31 DIAGNOSIS — Z20822 Contact with and (suspected) exposure to covid-19: Secondary | ICD-10-CM

## 2019-02-01 LAB — NOVEL CORONAVIRUS, NAA: SARS-CoV-2, NAA: NOT DETECTED

## 2019-02-11 ENCOUNTER — Inpatient Hospital Stay: Payer: Medicare Other

## 2019-02-11 ENCOUNTER — Other Ambulatory Visit: Payer: Self-pay

## 2019-02-11 ENCOUNTER — Inpatient Hospital Stay
Admission: EM | Admit: 2019-02-11 | Discharge: 2019-02-12 | DRG: 100 | Disposition: A | Discharge status: Other Acute Inpatient Hospital | Payer: Medicare Other | Attending: Internal Medicine | Admitting: Internal Medicine

## 2019-02-11 ENCOUNTER — Emergency Department: Payer: Medicare Other

## 2019-02-11 ENCOUNTER — Encounter: Payer: Self-pay | Admitting: Emergency Medicine

## 2019-02-11 DIAGNOSIS — R414 Neurologic neglect syndrome: Secondary | ICD-10-CM | POA: Diagnosis present

## 2019-02-11 DIAGNOSIS — G936 Cerebral edema: Secondary | ICD-10-CM | POA: Diagnosis present

## 2019-02-11 DIAGNOSIS — E875 Hyperkalemia: Secondary | ICD-10-CM | POA: Diagnosis present

## 2019-02-11 DIAGNOSIS — Z809 Family history of malignant neoplasm, unspecified: Secondary | ICD-10-CM | POA: Diagnosis not present

## 2019-02-11 DIAGNOSIS — Z7982 Long term (current) use of aspirin: Secondary | ICD-10-CM

## 2019-02-11 DIAGNOSIS — K219 Gastro-esophageal reflux disease without esophagitis: Secondary | ICD-10-CM | POA: Diagnosis present

## 2019-02-11 DIAGNOSIS — G40409 Other generalized epilepsy and epileptic syndromes, not intractable, without status epilepticus: Secondary | ICD-10-CM | POA: Diagnosis present

## 2019-02-11 DIAGNOSIS — F329 Major depressive disorder, single episode, unspecified: Secondary | ICD-10-CM | POA: Diagnosis present

## 2019-02-11 DIAGNOSIS — Z87891 Personal history of nicotine dependence: Secondary | ICD-10-CM

## 2019-02-11 DIAGNOSIS — R251 Tremor, unspecified: Secondary | ICD-10-CM

## 2019-02-11 DIAGNOSIS — Z8249 Family history of ischemic heart disease and other diseases of the circulatory system: Secondary | ICD-10-CM

## 2019-02-11 DIAGNOSIS — E1151 Type 2 diabetes mellitus with diabetic peripheral angiopathy without gangrene: Secondary | ICD-10-CM | POA: Diagnosis present

## 2019-02-11 DIAGNOSIS — I110 Hypertensive heart disease with heart failure: Secondary | ICD-10-CM | POA: Diagnosis present

## 2019-02-11 DIAGNOSIS — H409 Unspecified glaucoma: Secondary | ICD-10-CM | POA: Diagnosis present

## 2019-02-11 DIAGNOSIS — F0390 Unspecified dementia without behavioral disturbance: Secondary | ICD-10-CM | POA: Diagnosis present

## 2019-02-11 DIAGNOSIS — I639 Cerebral infarction, unspecified: Secondary | ICD-10-CM | POA: Diagnosis present

## 2019-02-11 DIAGNOSIS — I2510 Atherosclerotic heart disease of native coronary artery without angina pectoris: Secondary | ICD-10-CM | POA: Diagnosis present

## 2019-02-11 DIAGNOSIS — G9389 Other specified disorders of brain: Secondary | ICD-10-CM | POA: Diagnosis not present

## 2019-02-11 DIAGNOSIS — M109 Gout, unspecified: Secondary | ICD-10-CM | POA: Diagnosis present

## 2019-02-11 DIAGNOSIS — I10 Essential (primary) hypertension: Secondary | ICD-10-CM | POA: Diagnosis not present

## 2019-02-11 DIAGNOSIS — Z888 Allergy status to other drugs, medicaments and biological substances status: Secondary | ICD-10-CM

## 2019-02-11 DIAGNOSIS — Z20828 Contact with and (suspected) exposure to other viral communicable diseases: Secondary | ICD-10-CM | POA: Diagnosis present

## 2019-02-11 DIAGNOSIS — I509 Heart failure, unspecified: Secondary | ICD-10-CM | POA: Diagnosis present

## 2019-02-11 DIAGNOSIS — E1165 Type 2 diabetes mellitus with hyperglycemia: Secondary | ICD-10-CM | POA: Diagnosis present

## 2019-02-11 DIAGNOSIS — Z8546 Personal history of malignant neoplasm of prostate: Secondary | ICD-10-CM

## 2019-02-11 DIAGNOSIS — C719 Malignant neoplasm of brain, unspecified: Secondary | ICD-10-CM | POA: Diagnosis not present

## 2019-02-11 DIAGNOSIS — Z8673 Personal history of transient ischemic attack (TIA), and cerebral infarction without residual deficits: Secondary | ICD-10-CM

## 2019-02-11 DIAGNOSIS — E785 Hyperlipidemia, unspecified: Secondary | ICD-10-CM | POA: Diagnosis present

## 2019-02-11 DIAGNOSIS — F419 Anxiety disorder, unspecified: Secondary | ICD-10-CM | POA: Diagnosis present

## 2019-02-11 DIAGNOSIS — G253 Myoclonus: Secondary | ICD-10-CM | POA: Diagnosis present

## 2019-02-11 DIAGNOSIS — G40909 Epilepsy, unspecified, not intractable, without status epilepticus: Secondary | ICD-10-CM | POA: Diagnosis present

## 2019-02-11 DIAGNOSIS — D72829 Elevated white blood cell count, unspecified: Secondary | ICD-10-CM | POA: Diagnosis present

## 2019-02-11 DIAGNOSIS — Z96642 Presence of left artificial hip joint: Secondary | ICD-10-CM | POA: Diagnosis present

## 2019-02-11 DIAGNOSIS — G40109 Localization-related (focal) (partial) symptomatic epilepsy and epileptic syndromes with simple partial seizures, not intractable, without status epilepticus: Secondary | ICD-10-CM | POA: Diagnosis not present

## 2019-02-11 DIAGNOSIS — Z7902 Long term (current) use of antithrombotics/antiplatelets: Secondary | ICD-10-CM

## 2019-02-11 DIAGNOSIS — Z7984 Long term (current) use of oral hypoglycemic drugs: Secondary | ICD-10-CM

## 2019-02-11 LAB — COMPREHENSIVE METABOLIC PANEL
ALT: 16 U/L (ref 0–44)
AST: 28 U/L (ref 15–41)
Albumin: 4 g/dL (ref 3.5–5.0)
Alkaline Phosphatase: 76 U/L (ref 38–126)
Anion gap: 15 (ref 5–15)
BUN: 14 mg/dL (ref 8–23)
CO2: 24 mmol/L (ref 22–32)
Calcium: 9.1 mg/dL (ref 8.9–10.3)
Chloride: 103 mmol/L (ref 98–111)
Creatinine, Ser: 1.15 mg/dL (ref 0.61–1.24)
GFR calc Af Amer: >60 mL/min (ref >60)
GFR calc non Af Amer: 58 mL/min — ABNORMAL LOW (ref >60)
Glucose, Bld: 189 mg/dL — ABNORMAL HIGH (ref 70–99)
Potassium: 4.1 mmol/L (ref 3.5–5.1)
Sodium: 142 mmol/L (ref 135–145)
Total Bilirubin: 0.7 mg/dL (ref 0.3–1.2)
Total Protein: 7.3 g/dL (ref 6.5–8.1)

## 2019-02-11 LAB — URINALYSIS, COMPLETE (UACMP) WITH MICROSCOPIC
Bacteria, UA: NONE SEEN
Bilirubin Urine: NEGATIVE
Glucose, UA: NEGATIVE mg/dL
Hgb urine dipstick: NEGATIVE
Ketones, ur: 5 mg/dL — AB
Leukocytes,Ua: NEGATIVE
Nitrite: NEGATIVE
Protein, ur: NEGATIVE mg/dL
Specific Gravity, Urine: 1.019 (ref 1.005–1.030)
Squamous Epithelial / HPF: NONE SEEN (ref 0–5)
pH: 5 (ref 5.0–8.0)

## 2019-02-11 LAB — CBC
HCT: 47.3 % (ref 39.0–52.0)
Hemoglobin: 15.5 g/dL (ref 13.0–17.0)
MCH: 31 pg (ref 26.0–34.0)
MCHC: 32.8 g/dL (ref 30.0–36.0)
MCV: 94.6 fL (ref 80.0–100.0)
Platelets: 168 10*3/uL (ref 150–400)
RBC: 5 MIL/uL (ref 4.22–5.81)
RDW: 14.3 % (ref 11.5–15.5)
WBC: 11.3 10*3/uL — ABNORMAL HIGH (ref 4.0–10.5)
nRBC: 0 % (ref 0.0–0.2)

## 2019-02-11 LAB — GLUCOSE, CAPILLARY: Glucose-Capillary: 90 mg/dL (ref 70–99)

## 2019-02-11 LAB — TROPONIN I (HIGH SENSITIVITY)
Troponin I (High Sensitivity): 35 ng/L — ABNORMAL HIGH (ref <18)
Troponin I (High Sensitivity): 86 ng/L — ABNORMAL HIGH (ref <18)
Troponin I (High Sensitivity): 93 ng/L — ABNORMAL HIGH (ref <18)
Troponin I (High Sensitivity): 96 ng/L — ABNORMAL HIGH (ref <18)
Troponin I (High Sensitivity): 83 ng/L — ABNORMAL HIGH (ref <18)

## 2019-02-11 MED ORDER — ASPIRIN EC 81 MG PO TBEC
81.0000 mg | DELAYED_RELEASE_TABLET | Freq: Every day | ORAL | Status: DC
  Administered 2019-02-12: 81 mg via ORAL
  Filled 2019-02-11: qty 1

## 2019-02-11 MED ORDER — CLOPIDOGREL BISULFATE 75 MG PO TABS
75.0000 mg | ORAL_TABLET | Freq: Every day | ORAL | Status: DC
  Administered 2019-02-12: 75 mg via ORAL
  Filled 2019-02-11: qty 1

## 2019-02-11 MED ORDER — LORAZEPAM 2 MG/ML IJ SOLN
INTRAMUSCULAR | Status: AC
  Administered 2019-02-11: 1 mg via INTRAVENOUS
  Filled 2019-02-11: qty 1

## 2019-02-11 MED ORDER — LATANOPROST 0.005 % OP SOLN
1.0000 [drp] | Freq: Every day | OPHTHALMIC | Status: DC
  Administered 2019-02-11: 1 [drp] via OPHTHALMIC
  Filled 2019-02-11: qty 2.5

## 2019-02-11 MED ORDER — INSULIN ASPART 100 UNIT/ML ~~LOC~~ SOLN: 0.0000 [IU] | Freq: Every day | SUBCUTANEOUS | Status: DC

## 2019-02-11 MED ORDER — DONEPEZIL HCL 5 MG PO TABS: 10.0000 mg | ORAL_TABLET | Freq: Every day | ORAL | Status: DC

## 2019-02-11 MED ORDER — TRIAMCINOLONE ACETONIDE 55 MCG/ACT NA AERO
2.0000 | INHALATION_SPRAY | Freq: Every day | NASAL | Status: DC
  Administered 2019-02-12: 09:00:00 2 via NASAL
  Filled 2019-02-11: qty 21.6

## 2019-02-11 MED ORDER — ESCITALOPRAM OXALATE 10 MG PO TABS
10.0000 mg | ORAL_TABLET | Freq: Every day | ORAL | Status: DC
  Administered 2019-02-12: 10 mg via ORAL
  Filled 2019-02-11 (×2): qty 1

## 2019-02-11 MED ORDER — LORAZEPAM 2 MG/ML IJ SOLN
INTRAMUSCULAR | Status: AC
  Administered 2019-02-11: 2 mg via INTRAMUSCULAR
  Filled 2019-02-11: qty 1

## 2019-02-11 MED ORDER — ACETAMINOPHEN 650 MG RE SUPP
650.0000 mg | RECTAL | Status: DC | PRN
  Administered 2019-02-12: 04:00:00 650 mg via RECTAL
  Filled 2019-02-11: qty 1

## 2019-02-11 MED ORDER — ACETAMINOPHEN 160 MG/5ML PO SOLN
650.0000 mg | ORAL | Status: DC | PRN
  Filled 2019-02-11: qty 20.3

## 2019-02-11 MED ORDER — ENOXAPARIN SODIUM 40 MG/0.4ML ~~LOC~~ SOLN
40.0000 mg | SUBCUTANEOUS | Status: DC
  Administered 2019-02-11: 40 mg via SUBCUTANEOUS
  Filled 2019-02-11: qty 0.4

## 2019-02-11 MED ORDER — MONTELUKAST SODIUM 10 MG PO TABS: 10.0000 mg | ORAL_TABLET | Freq: Every day | ORAL | Status: DC

## 2019-02-11 MED ORDER — LORAZEPAM 0.5 MG PO TABS
0.5000 mg | ORAL_TABLET | Freq: Three times a day (TID) | ORAL | Status: DC
  Administered 2019-02-12 (×2): 0.5 mg via ORAL
  Filled 2019-02-11 (×2): qty 1

## 2019-02-11 MED ORDER — POLYVINYL ALCOHOL 1.4 % OP SOLN
1.0000 [drp] | Freq: Two times a day (BID) | OPHTHALMIC | Status: DC
  Administered 2019-02-11 – 2019-02-12 (×2): 1 [drp] via OPHTHALMIC
  Filled 2019-02-11: qty 15

## 2019-02-11 MED ORDER — IOHEXOL 350 MG/ML SOLN
75.0000 mL | Freq: Once | INTRAVENOUS | Status: AC | PRN
  Administered 2019-02-11: 18:00:00 75 mL via INTRAVENOUS

## 2019-02-11 MED ORDER — SENNOSIDES-DOCUSATE SODIUM 8.6-50 MG PO TABS: 1.0000 | ORAL_TABLET | Freq: Every evening | ORAL | Status: DC | PRN

## 2019-02-11 MED ORDER — LORAZEPAM 2 MG/ML IJ SOLN
2.0000 mg | Freq: Once | INTRAMUSCULAR | Status: AC
  Administered 2019-02-11: 14:00:00 2 mg via INTRAMUSCULAR

## 2019-02-11 MED ORDER — LORAZEPAM 2 MG/ML IJ SOLN
1.0000 mg | INTRAMUSCULAR | Status: DC | PRN
  Administered 2019-02-11 – 2019-02-12 (×2): 1 mg via INTRAVENOUS
  Filled 2019-02-11 (×3): qty 1

## 2019-02-11 MED ORDER — INSULIN ASPART 100 UNIT/ML ~~LOC~~ SOLN
0.0000 [IU] | Freq: Three times a day (TID) | SUBCUTANEOUS | Status: DC
  Administered 2019-02-12: 1 [IU] via SUBCUTANEOUS
  Administered 2019-02-12: 3 [IU] via SUBCUTANEOUS
  Filled 2019-02-11 (×2): qty 1

## 2019-02-11 MED ORDER — ACETAMINOPHEN 325 MG PO TABS
650.0000 mg | ORAL_TABLET | ORAL | Status: DC | PRN
  Filled 2019-02-11: qty 2

## 2019-02-11 MED ORDER — PANTOPRAZOLE SODIUM 40 MG PO TBEC
40.0000 mg | DELAYED_RELEASE_TABLET | Freq: Every day | ORAL | Status: DC
  Administered 2019-02-12: 40 mg via ORAL
  Filled 2019-02-11: qty 1

## 2019-02-11 MED ORDER — LORAZEPAM 2 MG/ML IJ SOLN
1.0000 mg | Freq: Once | INTRAMUSCULAR | Status: AC
  Administered 2019-02-11: 21:00:00 1 mg via INTRAVENOUS

## 2019-02-11 MED ORDER — ATORVASTATIN CALCIUM 20 MG PO TABS
40.0000 mg | ORAL_TABLET | Freq: Every day | ORAL | Status: DC
  Administered 2019-02-12: 18:00:00 40 mg via ORAL
  Filled 2019-02-11: qty 2

## 2019-02-11 MED ORDER — BRIMONIDINE TARTRATE 0.2 % OP SOLN
1.0000 [drp] | Freq: Every day | OPHTHALMIC | Status: DC
  Administered 2019-02-12: 1 [drp] via OPHTHALMIC
  Filled 2019-02-11: qty 5

## 2019-02-11 MED ORDER — VALPROATE SODIUM 500 MG/5ML IV SOLN
15.0000 mg/kg/d | Freq: Three times a day (TID) | INTRAVENOUS | Status: DC
  Administered 2019-02-12 (×2): 518 mg via INTRAVENOUS
  Filled 2019-02-11 (×5): qty 5.18

## 2019-02-11 MED ORDER — STROKE: EARLY STAGES OF RECOVERY BOOK
Freq: Once | Status: AC
  Administered 2019-02-11: 19:00:00

## 2019-02-11 MED ORDER — VALPROATE SODIUM 500 MG/5ML IV SOLN
2000.0000 mg | Freq: Once | INTRAVENOUS | Status: AC
  Administered 2019-02-11: 2000 mg via INTRAVENOUS
  Filled 2019-02-11: qty 20

## 2019-02-11 NOTE — ED Provider Notes (Signed)
[facility] Emergency Department Provider Note  Time seen: 12:58 PM  I have reviewed the triage vital signs and the nursing notes.   HISTORY  Chief Complaint Seizures   HPI Darrell Clark is a 85 y.o. male with a past medical history of anxiety, CAD, CHF, depression, dementia, gastric reflux, hypertension, presents to the emergency department for shaking.  According to the patient approximately 2 days ago he fell hitting his head.  Ever since his episodes of "shaking" have worsened significantly.  Patient states he has had mild shaking previously.  EMS states upon arrival the patient appeared to be in a tonic-clonic versus partial seizures of the patient received 2 mg of Versed without improvement.  Here in the emergency department patient had another episode of shaking in which he has full body twitching but is able to communicate throughout the event.  It appears to affect both sides of his body which would make partial seizure less likely.  Patient denies any headache denies any trouble thinking or speaking.  Does have a history of dementia.   Past Medical History:  Diagnosis Date  . Acquired cyst of kidney   . Allergy   . Anxiety   . Arthritis   . Atherosclerosis   . BPH (benign prostatic hyperplasia)   . CAD (coronary artery disease)   . Cardiac arrhythmia   . CHF (congestive heart failure) ([place])   . Chronic prostatitis   . Degenerative arthritis   . Dementia ([place])    mild  . Depression   . Diabetes mellitus without complication ([place])   . Dyspnea    with exertion  . Elevated prostate specific antigen (PSA)   . Encysted hydrocele   . GERD (gastroesophageal reflux disease)   . Glaucoma (increased eye pressure)   . Gout   . Gross hematuria   . Hemorrhoids   . HLD (hyperlipidemia)   . Hyperlipidemia, unspecified   . Hypertension   . Hypertrophy of breast   . Impotence of organic origin   . Incomplete bladder emptying   . Malignant neoplasm of  prostate ([place])   . Osteoarthritis   . Other specified disorders of the male genital organs   . Prostate cancer ([place])   . PVD (peripheral vascular disease) ([place])   . Sciatica   . Spermatocele   . Spinal stenosis of lumbar region with radiculopathy 11/11/2015  . Stroke ([place])   . Testicular hypofunction    other  . Urinary frequency   . Urinary obstruction    not elsewhere classified  . Vertigo     Patient Active Problem List   Diagnosis Date Noted  . Ascending aortic aneurysm ([place]) 07/16/2017  . Primary osteoarthritis 06/13/2017  . Acute postoperative pain of left hip 05/13/2017  . Status post total replacement of hip 05/06/2017  . Lumbar radiculopathy, acute 01/01/2017  . Generalized weakness 01/01/2017  . Current moderate episode of major depressive disorder without prior episode ([place]) 12/18/2016  . Major depression in remission ([place]) 12/18/2016  . Dizziness 12/13/2016  . Inguinal hernia of left side without obstruction or gangrene 09/13/2016  . SOBOE (shortness of breath on exertion) 08/03/2016  . Bilateral carotid artery stenosis 08/03/2016  . Vision, loss, sudden 04/16/2016  . 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 Intracranial vascular stenosis 04/16/2016  . CVA (cerebral vascular accident) ([place]) 04/15/2016  . HTN (hypertension) 04/15/2016  . Diabetes ([place]) 04/15/2016  . GERD (gastroesophageal reflux disease) 04/15/2016  .  HLD (hyperlipidemia) 04/15/2016  . Primary osteoarthritis of both hips 09/24/2015  . Moderate tricuspid insufficiency 01/10/2015  . Moderate mitral insufficiency 08/31/2014  . Mild aortic valve stenosis 08/31/2014  . CAD (coronary artery disease) 08/17/2014  . Spermatocele 04/21/2012  . Sciatica 04/21/2012  . Personal history of prostate cancer 04/21/2012  . Increased frequency of urination 04/21/2012  . Incomplete emptying of bladder 04/21/2012  . Encysted hydrocele 04/21/2012  . ED (erectile  dysfunction) of organic origin 04/21/2012    Past Surgical History:  Procedure Laterality Date  . APPENDECTOMY    . CARDIAC CATHETERIZATION  11/2013  . FIDUCIAL SEED PLACEMENT  10/14/2009  . PROSTATE BIOPSY  08/11/2009  . RADIATION FOR PROSTATE CANCER    . TOTAL HIP ARTHROPLASTY Left 05/06/2017   Procedure: TOTAL HIP ARTHROPLASTY;  Surgeon: Dereck Leep, MD;  Location: [facility] ORS;  Service: Orthopedics;  Laterality: Left;    Prior to Admission medications   Medication Sig Start Date End Date Taking? Authorizing Provider  acetaminophen (TYLENOL) 500 MG tablet Take 1,000 mg by mouth 3 (three) times daily.     [provider]  amLODipine (NORVASC) 5 MG tablet Take 5 mg by mouth daily.  02/21/13   [provider]  aspirin EC 81 MG EC tablet Take 1 tablet (81 mg total) by mouth daily. 04/16/16   Theodoro Grist, MD  benazepril (LOTENSIN) 20 MG tablet Take 20 mg by mouth daily.     [provider]  brimonidine (ALPHAGAN) 0.2 % ophthalmic solution Place 1 drop into both eyes daily. 07/10/18   [provider]  Brinzolamide-Brimonidine (SIMBRINZA) 1-0.2 % SUSP Apply 1 drop to eye daily.    [provider]  clopidogrel (PLAVIX) 75 MG tablet  01/09/19   [provider]  docusate sodium (COLACE) 100 MG capsule Take 100 mg by mouth 2 (two) times daily as needed. As needed.     [provider]  donepezil (ARICEPT) 10 MG tablet Take 10 mg by mouth at bedtime.  02/21/13   [provider]  escitalopram (LEXAPRO) 10 MG tablet  01/09/19   [provider]  glipiZIDE (GLUCOTROL XL) 2.5 MG 24 hr tablet Take 1 tablet (2.5 mg total) by mouth daily with breakfast. 04/16/16   Theodoro Grist, MD  glucose blood (ONE TOUCH ULTRA TEST) test strip TEST TWICE DAILY. 07/25/17   [provider]  latanoprost (XALATAN) 0.005 % ophthalmic solution Place 1 drop into the right eye at bedtime. 12/17/16   Dustin Flock, MD  LORazepam (ATIVAN) 0.5  MG tablet Take 0.5 mg by mouth every 8 (eight) hours.    [provider]  meclizine (ANTIVERT) 12.5 MG tablet Take 12.5 mg by mouth 3 (three) times daily as needed for dizziness.    [provider]  Misc Natural Products (OSTEO BI-FLEX TRIPLE STRENGTH) TABS Take 1 tablet by mouth 2 (two) times daily.     [provider]  montelukast (SINGULAIR) 10 MG tablet  01/09/19   [provider]  nitroGLYCERIN (NITROSTAT) 0.4 MG SL tablet Place 0.4 mg under the tongue every 5 (five) minutes as needed. For chest pain. Give up to 3 doses every 5 minutes as needed 11/20/13   [provider]  Jonetta Speak LANCETS [id number] [place]  08/11/12   [provider]  pantoprazole (PROTONIX) 40 MG tablet  01/09/19   [provider]  Polyethyl Glycol-Propyl Glycol (SYSTANE) 0.4-0.3 % SOLN Place 1 drop into both eyes 2 (two) times daily.  [provider]  simvastatin (ZOCOR) 20 MG tablet  01/09/19   [provider]  triamcinolone (NASACORT) 55 MCG/ACT AERO nasal inhaler Place into the nose. 02/10/18 02/10/19  [provider]    Allergies  Allergen Reactions  . Niacin Itching and Rash    Other reaction(s): Unknown Other reaction(s): UNKNOWN  . Niacin And Related Rash    Family History  Problem Relation Age of Onset  . Heart attack Mother   . Hypertension Mother   . Heart attack Father   . Hypertension Father   . Hypertension Sister   . Cancer Brother   . Hypertension Brother   . Hypertension Brother   . Hypertension Brother   . Hypertension Sister   . Kidney cancer Neg Hx   . Prostate cancer Neg Hx   . GU problems Neg Hx     Social History Social History   Tobacco Use  . Smoking status: Former Smoker    Packs/day: 1.00    Years: 50.00    Pack years: 50.00    Types: Cigarettes    Quit date: 11/19/2007    Years since quitting: 11.2  . Smokeless tobacco: Never Used  Substance Use Topics  . Alcohol use: No  . Drug use: No     Review of Systems Constitutional: Negative for fever. Cardiovascular: Negative for chest pain. Respiratory: Negative for shortness of breath. Gastrointestinal: Negative for abdominal pain, vomiting Musculoskeletal: Negative for musculoskeletal complaints Skin: Negative for skin complaints  Neurological: Negative for headache.  Positive for shaking. All other ROS negative  ____________________________________________   PHYSICAL EXAM:  Constitutional: Patient is awake and alert.  No acute distress.  Patient does have intermittent jerking of all extremities at times it is quite significant but the patient is able to [facility] and speak throughout. Eyes: Normal exam ENT      Head: Normocephalic and atraumatic.      Mouth/Throat: Mucous membranes are moist. Cardiovascular: Normal rate, regular rhythm.  Respiratory: Normal respiratory effort without tachypnea nor retractions. Breath sounds are clear Gastrointestinal: Soft and nontender. No distention.  Musculoskeletal: Nontender with normal range of motion in all extremities.  Neurologic:  Normal speech and language. No gross focal neurologic deficits  Skin:  Skin is warm, dry and intact.  Psychiatric: Mood and affect are normal.   ____________________________________________    EKG  EKG viewed and interpreted by myself shows a normal sinus rhythm at 87 bpm shows a narrow QRS, left axis deviation, largely normal intervals.  ____________________________________________    RADIOLOGY  CT shows several ill-defined densities concerning for infarction versus edema versus other pathology.  MRI is recommended.  ____________________________________________   INITIAL IMPRESSION / ASSESSMENT AND PLAN / ED COURSE  Pertinent labs & imaging results that were available during my care of the patient were reviewed by me and considered in my medical decision making (see chart for details).   Patient presents to the emergency department  for diffuse body shaking which has worsened over the past 2 days ever since a fall.  Patient does state head injury but denies LOC.  Differential would include ICH such as SDH, seizure, partial seizure, metabolic or electrolyte abnormality.  We will check labs, CT scan of the head, will likely obtain neurology consultation if possible in the emergency department for further recommendations and disposition.  CT shows several ill-defined opacities in the brain CT.  Patient continues to have intermittent shaking episodes.  I discussed with neurology who will be down to see the  patient.  I admitted the patient to the hospitalist service for further treatment and work-up.  Ideally we would like to get an MRI however the patient is shaking I do not believe it is realistic at this time.  I have dose Ativan to see if this will help with the shaking episodes.  Aldrich ELENA RUBERG was evaluated in Emergency Department on 02/11/2019 for the symptoms described in the history of present illness. He was evaluated in the context of the global COVID-19 pandemic, which necessitated consideration that the patient might be at risk for infection with the SARS-CoV-2 virus that causes COVID-19. Institutional protocols and algorithms that pertain to the evaluation of patients at risk for COVID-19 are in a state of rapid change based on information released by regulatory bodies including the CDC and federal and state organizations. These policies and algorithms were followed during the patient's care in the ED.  ____________________________________________   FINAL CLINICAL IMPRESSION(S) / ED DIAGNOSES  Tiffany Kocher, MD 02/11/19 1427

## 2019-02-11 NOTE — ED Notes (Signed)
Noted pt's IV catheter pulled out of skin and kinked, disconnected from pigtail. Unable to replace catheter. IV removed, dressing applied. New PIV attempted x2, unsuccessful d/t pt's frequent large jerking movements to arms and legs. EDP Psduchowski notified, order received for 2mg  ativan IM. Medication given. New 22G started to L hand, pt required x3 nurses to hold arm still enough.

## 2019-02-11 NOTE — Progress Notes (Signed)
Family Meeting Note  Advance Directive:no  Today a meeting took place with the Patient and son].  Patient is able to participate.  The following clinical team members were present during this meeting:MD  The following were discussed:Patient's diagnosis: myoclonic jerking, Patient's progosis: Unable to determine and Goals for treatment: Full Code  Additional follow-up to be provided: prn  Time spent during discussion:20 minutes  Evette Doffing, MD

## 2019-02-11 NOTE — H&P (Addendum)
[place] [facility] [place] NAME: Darrell Clark    MR#:  [id number]  DATE OF BIRTH:  1933-04-11  DATE OF ADMISSION:  02/11/2019  PRIMARY CARE PHYSICIAN: Maryland Pink, MD   REQUESTING/REFERRING PHYSICIAN: Harvest Dark, MD  CHIEF COMPLAINT:   Chief Complaint  Patient presents with   Seizures    HISTORY OF PRESENT ILLNESS:  Darrell Clark  is a 85 y.o. male with a known history of hypertension, hyperlipidemia, CHF, type 2 diabetes, history of prostate cancer, CAD, anxiety, depression who presented to the ED with uncontrollable jerking.  Per son at bedside, the jerking started this morning.  The jerking seems to get worse at times and get better at times.  This is never happened to him before.  Patient denies any weakness, numbness, or tingling in his arms or legs.  No speech changes or vision changes.  In the ED, needles were unremarkable.  Labs are significant for glucose 189, WBC 11.3.  UA was negative for infection.  CT head showed several ill-defined right-sided subcortical white matter low densities concerning for infarction or edema due to other pathology.  Hospitalists were called for admission.  PAST MEDICAL HISTORY:   Past Medical History:  Diagnosis Date   Acquired cyst of kidney    Allergy    Anxiety    Arthritis    Atherosclerosis    BPH (benign prostatic hyperplasia)    CAD (coronary artery disease)    Cardiac arrhythmia    CHF (congestive heart failure) ([facility])    Chronic prostatitis    Degenerative arthritis    Dementia ([facility])    mild   Depression    Diabetes mellitus without complication ([facility])    Dyspnea    with exertion   Elevated prostate specific antigen (PSA)    Encysted hydrocele    GERD (gastroesophageal reflux disease)    Glaucoma (increased eye pressure)    Gout    Gross hematuria    Hemorrhoids    HLD (hyperlipidemia)    Hyperlipidemia, unspecified    Hypertension     Hypertrophy of breast    Impotence of organic origin    Incomplete bladder emptying    Malignant neoplasm of prostate ([facility])    Osteoarthritis    Other specified disorders of the male genital organs    Prostate cancer ([place])    PVD (peripheral vascular disease) ([place])    Sciatica    Spermatocele    Spinal stenosis of lumbar region with radiculopathy 11/11/2015   Stroke [facility])    Testicular hypofunction    other   Urinary frequency    Urinary obstruction    not elsewhere classified   Vertigo     PAST SURGICAL HISTORY:   Past Surgical History:  Procedure Laterality Date   APPENDECTOMY     CARDIAC CATHETERIZATION  11/2013   FIDUCIAL SEED PLACEMENT  10/14/2009   PROSTATE BIOPSY  08/11/2009   RADIATION FOR PROSTATE CANCER     TOTAL HIP ARTHROPLASTY Left 05/06/2017   Procedure: TOTAL HIP ARTHROPLASTY;  Surgeon: Dereck Leep, MD;  Location: [facility] ORS;  Service: Orthopedics;  Laterality: Left;    SOCIAL HISTORY:   Social History   Tobacco Use   Smoking status: Former Smoker    Packs/day: 1.00    Years: 50.00    Pack years: 50.00    Types: Cigarettes    Quit date: 11/19/2007    Years since quitting: 11.2   Smokeless tobacco: Never Used  Substance Use Topics   Alcohol use: No    FAMILY HISTORY:   Family History  Problem Relation Age of Onset   Heart attack Mother    Hypertension Mother    Heart attack Father    Hypertension Father    Hypertension Sister    Cancer Brother    Hypertension Brother    Hypertension Brother    Hypertension Brother    Hypertension Sister    Kidney cancer Neg Hx    Prostate cancer Neg Hx    GU problems Neg Hx     DRUG ALLERGIES:   Allergies  Allergen Reactions   Niacin Itching and Rash    Other reaction(s): Unknown Other reaction(s): UNKNOWN   Niacin And Related Rash    REVIEW OF SYSTEMS:   Review of Systems  Constitutional: Negative for chills and fever.  HENT: Negative for  congestion and sore throat.   Eyes: Negative for blurred vision and double vision.  Respiratory: Negative for cough and shortness of breath.   Cardiovascular: Negative for chest pain and palpitations.  Gastrointestinal: Negative for nausea and vomiting.  Genitourinary: Negative for dysuria and urgency.  Musculoskeletal: Negative for back pain and neck pain.  Neurological: Positive for tremors. Negative for dizziness, sensory change, focal weakness, seizures, loss of consciousness and headaches.  Psychiatric/Behavioral: Negative for depression. The patient is not nervous/anxious.     MEDICATIONS AT HOME:   Prior to Admission medications   Medication Sig Start Date End Date Taking? Authorizing Provider  acetaminophen (TYLENOL) 500 MG tablet Take 1,000 mg by mouth 3 (three) times daily.     [provider]  amLODipine (NORVASC) 5 MG tablet Take 5 mg by mouth daily.  02/21/13   [provider]  aspirin EC 81 MG EC tablet Take 1 tablet (81 mg total) by mouth daily. 04/16/16   Theodoro Grist, MD  benazepril (LOTENSIN) 20 MG tablet Take 20 mg by mouth daily.     [provider]  brimonidine (ALPHAGAN) 0.2 % ophthalmic solution Place 1 drop into both eyes daily. 07/10/18   [provider]  Brinzolamide-Brimonidine (SIMBRINZA) 1-0.2 % SUSP Apply 1 drop to eye daily.    [provider]  clopidogrel (PLAVIX) 75 MG tablet  01/09/19   [provider]  docusate sodium (COLACE) 100 MG capsule Take 100 mg by mouth 2 (two) times daily as needed. As needed.     [provider]  donepezil (ARICEPT) 10 MG tablet Take 10 mg by mouth at bedtime.  02/21/13   [provider]  escitalopram (LEXAPRO) 10 MG tablet  01/09/19   [provider]  glipiZIDE (GLUCOTROL XL) 2.5 MG 24 hr tablet Take 1 tablet (2.5 mg total) by mouth daily with breakfast. 04/16/16   Theodoro Grist, MD  glucose blood (ONE TOUCH ULTRA TEST) test strip TEST TWICE DAILY.  07/25/17   [provider]  latanoprost (XALATAN) 0.005 % ophthalmic solution Place 1 drop into the right eye at bedtime. 12/17/16   Dustin Flock, MD  LORazepam (ATIVAN) 0.5 MG tablet Take 0.5 mg by mouth every 8 (eight) hours.    [provider]  meclizine (ANTIVERT) 12.5 MG tablet Take 12.5 mg by mouth 3 (three) times daily as needed for dizziness.    [provider]  Misc Natural Products (OSTEO BI-FLEX TRIPLE STRENGTH) TABS Take 1 tablet by mouth 2 (two) times daily.     [provider]  montelukast (SINGULAIR) 10 MG tablet  01/09/19   [provider]  nitroGLYCERIN (NITROSTAT) 0.4 MG SL tablet Place 0.4 mg under the tongue every 5 (five) minutes as needed. For chest pain. Give up to 3 doses every 5 minutes as needed 11/20/13   [provider]  Jonetta Speak LANCETS [id number] [place]  08/11/12   [provider]  pantoprazole (PROTONIX) 40 MG tablet  01/09/19   [provider]  Polyethyl Glycol-Propyl Glycol (SYSTANE) 0.4-0.3 % SOLN Place 1 drop into both eyes 2 (two) times daily.    [provider]  simvastatin (ZOCOR) 20 MG tablet  01/09/19   [provider]  triamcinolone (NASACORT) 55 MCG/ACT AERO nasal inhaler Place into the nose. 02/10/18 02/10/19  [provider]      VITAL SIGNS:  Pulse 87, height 5\' 11"  (1.803 m), weight 103.6 kg.  PHYSICAL EXAMINATION:  Physical Exam  GENERAL:  84 y.o.-year-old patient lying in the bed with no acute distress.  EYES: Pupils equal, round, reactive to light and accommodation. No scleral icterus. Extraocular muscles intact.  HEENT: Head atraumatic, normocephalic. Oropharynx and nasopharynx clear.  NECK:  Supple, no jugular venous distention. No thyroid enlargement, no tenderness.  LUNGS: Normal breath sounds bilaterally, no wheezing, rales,rhonchi or crepitation. No use of accessory muscles of respiration.  CARDIOVASCULAR: S1, S2 normal. No murmurs, rubs, or gallops.   ABDOMEN: Soft, nontender, nondistended. Bowel sounds present. No organomegaly or mass.  EXTREMITIES: No pedal edema, cyanosis, or clubbing.  NEUROLOGIC: Cranial nerves II through XII are intact. Muscle strength 5/5 in all extremities. Sensation intact. Gait not checked. + Myoclonic jerking present. +left sided neglect. PSYCHIATRIC: The patient is alert and oriented x 3.  SKIN: No obvious rash, lesion, or ulcer.   LABORATORY PANEL:   CBC Recent Labs  Lab 02/11/19 1252  WBC 11.3*  HGB 15.5  HCT 47.3  PLT 168   ------------------------------------------------------------------------------------------------------------------  Chemistries  Recent Labs  Lab 02/11/19 1252  NA 142  K 4.1  CL 103  CO2 24  GLUCOSE 189*  BUN 14  CREATININE 1.15  CALCIUM 9.1  AST 28  ALT 16  ALKPHOS 76  BILITOT 0.7   ------------------------------------------------------------------------------------------------------------------  Cardiac Enzymes No results for input(s): TROPONINI in the last 168 hours. ------------------------------------------------------------------------------------------------------------------  RADIOLOGY:  Ct Head Wo Contrast  Result Date: 02/11/2019 CLINICAL DATA:  Seizures. EXAM: CT HEAD WITHOUT CONTRAST TECHNIQUE: Contiguous axial images were obtained from the base of the skull through the vertex without intravenous contrast. COMPARISON:  CT scan of June 22, 2018. FINDINGS: Brain: Ventricular size is within normal limits. No midline shift is noted. Interval development of several right-sided subcortical white matter low densities are noted concerning for infarction of indeterminate age or edema due to other pathology. No hemorrhage is noted. Vascular: No hyperdense vessel or unexpected calcification. Skull: Normal. Negative for fracture or focal lesion. Sinuses/Orbits: No acute finding. Other: None. IMPRESSION: Interval development of several ill-defined right sided  subcortical white matter low densities concerning for infarction of indeterminate age or edema due to other pathology. MRI is recommended for further evaluation. Electronically Signed   By: Marijo Conception M.D.   On: 02/11/2019 13:31      IMPRESSION AND PLAN:   Myoclonic jerking / left sided neglect- may be due to stroke. -Neurology consulted- started on Depakote -CTA head ordered stat -MRI brain -EEG -Proceed with further stroke work-up pending MRI brain, per neuro recommendations -Continue home aspirin and Plavix -Switch simvastatin to lipitor -check A1c and lipid panel  Leukocytosis- likely due to above. No  infectious symptoms. -Trend WBC  Elevated troponin with a history of CAD- no active chest pain. Troponin 35 > 86. Likely demand ischemia. -Trend troponins -Continue home aspirin and plavix -Change simvastatin to lipitor  Hypertension- BP mildly elevated in the ED -Holding home Norvasc and benazepril for permissive hypertension  Type 2 diabetes-blood sugar elevated in the ED -Sensitive SSI -Check A1c  Chronic anxiety- stable -Continue home Lexapro and Ativan   All the records are reviewed and case discussed with ED provider. Management plans discussed with the patient, family and they are in agreement.  CODE STATUS: Full  TOTAL TIME TAKING CARE OF THIS PATIENT: 45 minutes.    Berna Spare Darrell Clark M.D on 02/11/2019 at 2:03 PM  Between 7am to 6pm - Pager [phone]  After 6pm go to [email or web address] - Proofreader  [facility]  Office  [phone]  CC: Primary care physician; Maryland Pink, MD   Note: This dictation was prepared with Dragon dictation along with smaller phrase technology. Any transcriptional errors that result from this process are unintentional.

## 2019-02-11 NOTE — Consult Note (Signed)
Neurology [place] at [facility]   Referring Physician: Mayo, Pete Pelt, MD  Chief Complaint/Reason for Consult:  I have been asked to see the patient in neurological consultation to render advice and opinion regarding seizure-like activity  HPI: [name] is a 85 y.o.  male with a history significant for dementia, DM, CHF, depression, HTN, HLD who presents to the hospital via EMS with seizure-like activity. History provided by patient and supplemented by review of EMR.  Patient was found on the floor by his family with seizure-like activity. He was last seen normal and at his baseline around [place] before his son left to go to work. Family friend came over to check on him around noon time and found him on the floor. He was witnessed to have jerking movements in all extremities. EMS was called to the scene and it was reported to ED team by EMS "Pt had focal seizures throughout transport. EMS administered 2 mg Versed prior to arrival." Patient reportedly fell 2 days ago and hit his head.   While in the ED, patient continues to have these jerking movements. He would stop for a few minutes but it will start back up again. Labs are significant for mild leukocytosis of 11.3 WBC. HCT w/o contrast revealed "Interval development of several ill-defined right sided subcortical white matter low densities concerning for infarction of indeterminate age or edema due to other pathology." All images independently visualized.   Past Medical History:  Diagnosis Date  . Acquired cyst of kidney   . Allergy   . Anxiety   . Arthritis   . Atherosclerosis   . BPH (benign prostatic hyperplasia)   . CAD (coronary artery disease)   . Cardiac arrhythmia   . CHF (congestive heart failure) ([place])   . Chronic prostatitis   . Degenerative arthritis   . Dementia ([place])    mild  . Depression   . Diabetes mellitus without complication ([place])   . Dyspnea    with exertion  . Elevated prostate  specific antigen (PSA)   . Encysted hydrocele   . GERD (gastroesophageal reflux disease)   . Glaucoma (increased eye pressure)   . Gout   . Gross hematuria   . Hemorrhoids   . HLD (hyperlipidemia)   . Hyperlipidemia, unspecified   . Hypertension   . Hypertrophy of breast   . Impotence of organic origin   . Incomplete bladder emptying   . Malignant neoplasm of prostate ([place])   . Osteoarthritis   . Other specified disorders of the male genital organs   . Prostate cancer ([place])   . PVD (peripheral vascular disease) ([place])   . Sciatica   . Spermatocele   . Spinal stenosis of lumbar region with radiculopathy 11/11/2015  . Stroke ([place])   . Testicular hypofunction    other  . Urinary frequency   . Urinary obstruction    not elsewhere classified  . Vertigo     Past Surgical History:  Procedure Laterality Date  . APPENDECTOMY    . CARDIAC CATHETERIZATION  11/2013  . FIDUCIAL SEED PLACEMENT  10/14/2009  . PROSTATE BIOPSY  08/11/2009  . RADIATION FOR PROSTATE CANCER    . TOTAL HIP ARTHROPLASTY Left 05/06/2017   Procedure: TOTAL HIP ARTHROPLASTY;  Surgeon: Dereck Leep, MD;  Location: [facility] ORS;  Service: Orthopedics;  Laterality: Left;   Allergies  Allergen Reactions  . Niacin Itching and Rash    Other reaction(s): Unknown Other reaction(s): UNKNOWN  .  Niacin And Related Rash   Prior to Admission medications   Medication Sig Start Date End Date Taking? Authorizing Provider  acetaminophen (TYLENOL) 500 MG tablet Take 1,000 mg by mouth 3 (three) times daily.     [provider]  amLODipine (NORVASC) 5 MG tablet Take 5 mg by mouth daily.  02/21/13   [provider]  aspirin EC 81 MG EC tablet Take 1 tablet (81 mg total) by mouth daily. 04/16/16   Theodoro Grist, MD  benazepril (LOTENSIN) 20 MG tablet Take 20 mg by mouth daily.     [provider]  brimonidine (ALPHAGAN) 0.2 % ophthalmic solution Place 1 drop into both eyes daily. 07/10/18   [provider]  Brinzolamide-Brimonidine (SIMBRINZA) 1-0.2 % SUSP Apply 1 drop to eye daily.    [provider]  clopidogrel (PLAVIX) 75 MG tablet  01/09/19   [provider]  docusate sodium (COLACE) 100 MG capsule Take 100 mg by mouth 2 (two) times daily as needed. As needed.     [provider]  donepezil (ARICEPT) 10 MG tablet Take 10 mg by mouth at bedtime.  02/21/13   [provider]  escitalopram (LEXAPRO) 10 MG tablet  01/09/19   [provider]  glipiZIDE (GLUCOTROL XL) 2.5 MG 24 hr tablet Take 1 tablet (2.5 mg total) by mouth daily with breakfast. 04/16/16   Theodoro Grist, MD  glucose blood (ONE TOUCH ULTRA TEST) test strip TEST TWICE DAILY. 07/25/17   [provider]  latanoprost (XALATAN) 0.005 % ophthalmic solution Place 1 drop into the right eye at bedtime. 12/17/16   Dustin Flock, MD  LORazepam (ATIVAN) 0.5 MG tablet Take 0.5 mg by mouth every 8 (eight) hours.    [provider]  meclizine (ANTIVERT) 12.5 MG tablet Take 12.5 mg by mouth 3 (three) times daily as needed for dizziness.    [provider]  Misc Natural Products (OSTEO BI-FLEX TRIPLE STRENGTH) TABS Take 1 tablet by mouth 2 (two) times daily.     [provider]  montelukast (SINGULAIR) 10 MG tablet  01/09/19   [provider]  nitroGLYCERIN (NITROSTAT) 0.4 MG SL tablet Place 0.4 mg under the tongue every 5 (five) minutes as needed. For chest pain. Give up to 3 doses every 5 minutes as needed 11/20/13   [provider]  Jonetta Speak LANCETS [id number] [place]  08/11/12   [provider]  pantoprazole (PROTONIX) 40 MG tablet  01/09/19   [provider]  Polyethyl Glycol-Propyl Glycol (SYSTANE) 0.4-0.3 % SOLN Place 1 drop into both eyes 2 (two) times daily.    [provider]  simvastatin (ZOCOR) 20 MG tablet  01/09/19   [provider]  triamcinolone (NASACORT) 55 MCG/ACT AERO nasal inhaler Place into the  nose. 02/10/18 02/10/19  [provider]   Family History  Problem Relation Age of Onset  . Heart attack Mother   . Hypertension Mother   . Heart attack Father   . Hypertension Father   . Hypertension Sister   . Cancer Brother   . Hypertension Brother   . Hypertension Brother   . Hypertension Brother   . Hypertension Sister   . Kidney cancer Neg Hx   . Prostate cancer Neg Hx   . GU problems Neg Hx    Social History   Socioeconomic History  . Marital status: Widowed    Spouse name: Not on file  . Number of children: 2  . Years of education:  12+  . Highest education level: Some college, no degree  Occupational History  . Not on file  Social Needs  . Financial resource strain: Not on file  . Food insecurity    Worry: Not on file    Inability: Not on file  . Transportation needs    Medical: Not on file    Non-medical: Not on file  Tobacco Use  . Smoking status: Former Smoker    Packs/day: 1.00    Years: 50.00    Pack years: 50.00    Types: Cigarettes    Quit date: 11/19/2007    Years since quitting: 11.2  . Smokeless tobacco: Never Used  Substance and Sexual Activity  . Alcohol use: No  . Drug use: No  . Sexual activity: Not Currently  Lifestyle  . Physical activity    Days per week: Not on file    Minutes per session: Not on file  . Stress: Not on file  Relationships  . Social Herbalist on phone: Not on file    Gets together: Not on file    Attends religious service: Not on file    Active member of club or organization: Not on file    Attends meetings of clubs or organizations: Not on file    Relationship status: Not on file  . Intimate partner violence    Fear of current or ex partner: Not on file    Emotionally abused: Not on file    Physically abused: Not on file    Forced sexual activity: Not on file  Other Topics Concern  . Not on file  Social History Narrative  . Not on file    Review of Systems A comprehensive review of  systems was negative.   Objective: Pulse Rate:  [87] 87 (08/26 1241) Weight:  [103.6 kg] 103.6 kg (08/26 1243) No intake or output data in the 24 hours ending 02/11/19 1427  Wt Readings from Last 3 Encounters:  02/11/19 103.6 kg  07/12/18 102.1 kg  03/12/18 102.5 kg      Physical Exam:  GENERAL:  No acute distress.  EYES:   Pupils: pupils equally round, reactive to light.  ENT:   Throat: oropharynx clear.  CARDIOVASCULAR: regular rate and rhythm.  RESPIRATORY:  normal work of breathing  GASTROINTESTINAL:   Abdomen: benign, bowel sounds present.  SKIN:   Inspection: well perfused, no edema.   MENTAL STATUS EXAM:    Orientation: Alert and oriented to person, place and time.  Memory: Cooperative, follows commands well.Attention, concentration: Attention span and concentration are decreased.  Language: Speech is dysarthric and language is normal.  Fund of knowledge: Aware of current events, vocabulary appropriate for patient age.   CRANIAL NERVES:    CN 2 (Optic): Appears to have left visual field neglect. Difficult to tell if he truly has a visual field cut as his head is turned towards the right and he's not aware of his left side at all.  CN 3,4,6 (EOM): Pupils are reactive on the right. Minimally reactive on the left (baseline). Right gaze preference. Does not cross midline.  CN 5 (Trigeminal): Facial sensation is normal, no weakness of masticatory muscles.  CN 7 (Facial): Mild left facial weakness  CN 8 (Auditory): Auditory acuity grossly impaired.  CN 9,10 (Glossophar): The uvula is midline, the palate elevates symmetrically.  CN 11 (spinal access): Head is turned towards the right CN 12 (Hypoglossal): The tongue is midline. No atrophy or fasciculations.Marland Kitchen  MOTOR:  Muscle Strength: Strength - 5/5 on the RHB. Left upper extremity appears to be strong but he's neglecting that side so not moving to command. Left lower extremity withdraws to painful stimuli but otherwise he is not  able to raise it up likely related to neglect. Muscle Tone: Tone and muscle bulk are normal in the upper and lower extremities.   REFLEXES: DTRs - 2+ and symmetrical in all four extremities, plantar responses are muted bilaterally.   COORDINATION: Unable to do finger-to-nose on the left due to neglect   SENSATION: +Extinction. Neglecting his left hemibody. When asked if his left hand was his, he said it belonged to the examiner and not his.   GAIT: Deferred  NIHSS Level of Consciousness (1a.)   : Alert, keenly responsive (08/26 1457) LOC Questions (1b. )   +: Answers both questions correctly (08/26 1457) LOC Commands (1c. )   + : Performs both tasks correctly (08/26 1457) Best Gaze (2. )  +: Forced deviation (08/26 1457) Visual (3. )  +: Partial hemianopia (08/26 1457) Facial Palsy (4. )    : Minor paralysis (08/26 1457) Motor Arm, Left (5a. )   +: Drift (08/26 1457) Motor Arm, Right (5b. )   +: No drift (08/26 1457) Motor Leg, Left (6a. )   +: No effort against gravity (08/26 1457) Motor Leg, Right (6b. )   +: No drift (08/26 1457) Limb Ataxia (7. ): Absent (08/26 1457) Sensory (8. )   +: Severe to total sensory loss, patient is not aware of being touched in the face, arm, and leg (08/26 1457) Best Language (9. )   +: No aphasia (08/26 1457) Dysarthria (10. ): Mild-to-moderate dysarthria, patient slurs at least some words and, at worst, can be understood with some difficulty (08/26 1457) Extinction/Inattention (11.)   +: Profound hemi-inattention or extinction to more than one modality (08/26 1457) Complete NIHSS TOTAL: 13 (08/26 1457)  Labs: Recent Labs  Lab 02/11/19 1252  WBC 11.3*  HGB 15.5  HCT 47.3  PLT 168  MCV 94.6   Recent Labs  Lab 02/11/19 1252  NA 142  K 4.1  CL 103  CO2 24  BUN 14  CREATININE 1.15  CALCIUM 9.1      Significant Diagnostic Studies: All images independently visualized.    Impression: [name] is a 85 y.o.  male who presents  with jerking movements concerning for myoclonic jerks. HCT w/o contrast demonstrated multiple hypodensities in the right hemisphere concerning for ischemia vs. Possible mass. On exam, patient has severe left hemibody neglect so difficult to assess his strength on this side. Given that this started acutely, would need to evaluate for a stroke. These myoclonic jerks could be epileptic so will load with Depakote to see if it will help subside these movements in order to get more testing. Patient is not a tPA candidate since he is outside of the treatment window. Will need to assess if he's an endovascular candidate   Principal Problem:   CVA (cerebral vascular accident) [facility])  Recommendations: - Depacon 2000 mg IV x 1 now. In 12 hours, will start Depacon 15 mg/kg/day divided q8 hour.  - CTA head STAT once jerking movement subsides - need to assess for LVO given cortical signs on exam - If no LVO, will proceed with admission to get MRI brain w/wo contrast  - EEG  - Pending MRI results, will decide if he needs further stroke work-up - Neurology will follow  along.   It's been a pleasure participating in this patient's care. Please don't hesitate to call with any further questions or concerns.   Laurene Footman, MD Neurology

## 2019-02-11 NOTE — ED Notes (Signed)
Date and time results received: 02/11/19 1540  Test: troponin I (repeat) Critical Value: 86 (up from 35)  Name of Provider Notified: Dr. Brett Albino  Orders Received? Or Actions Taken?: no new orders at this time

## 2019-02-11 NOTE — Progress Notes (Signed)
VAST consulted to place 20g IV for CT. Pt having continuous myoclonic jerking. ED nurses X2 helping hold patient for IV placement.

## 2019-02-11 NOTE — ED Triage Notes (Signed)
Pt arrived from home via [place] ems. Pt's family found patient on floor with an estimated time on the floor of 4 hours. Pt had focal seizures throughout transport. EMS administered 2 mg Versed prior to arrival.

## 2019-02-11 NOTE — ED Notes (Addendum)
ED TO INPATIENT HANDOFF REPORT  ED Nurse Name and Phone #: [phone]  S Name/Age/Gender Darrell Clark 85 y.o. male Room/Bed: ED18A/ED18A  Code Status   Code Status: Prior  Home/SNF/Other Home Patient oriented to: self, place and situation Is this baseline? Yes   Triage Complete: Triage complete  Chief Complaint seizure  Triage Note Pt arrived from home via [place] ems. Pt's family found patient on floor with an estimated time on the floor of 4 hours. Pt had focal seizures throughout transport. EMS administered 2 mg Versed prior to arrival.    Allergies Allergies  Allergen Reactions  . Niacin Itching and Rash    Other reaction(s): Unknown Other reaction(s): UNKNOWN  . Niacin And Related Rash    Level of Care/Admitting Diagnosis ED Disposition    ED Disposition Condition [place] [facility] Area: [place] [[id number]]  Level of Care: Med-Surg [16]  Covid Evaluation: Asymptomatic Screening Protocol (No Symptoms)  Diagnosis: Myoclonic jerking [id number]  Admitting Physician: Hyman Bible DODD [id number]  Attending Physician: Hyman Bible DODD [id number]  Estimated length of stay: past midnight tomorrow  Certification:: I certify this patient will need inpatient services for at least 2 midnights  PT Class (Do Not Modify): Inpatient [101]  PT Acc Code (Do Not Modify): Private [1]       B Medical/Surgery History Past Medical History:  Diagnosis Date  . Acquired cyst of kidney   . Allergy   . Anxiety   . Arthritis   . Atherosclerosis   . BPH (benign prostatic hyperplasia)   . CAD (coronary artery disease)   . Cardiac arrhythmia   . CHF (congestive heart failure) ([place])   . Chronic prostatitis   . Degenerative arthritis   . Dementia ([place])    mild  . Depression   . Diabetes mellitus without complication ([place])   . Dyspnea    with exertion  . Elevated prostate specific antigen (PSA)   . Encysted hydrocele   . GERD (gastroesophageal  reflux disease)   . Glaucoma (increased eye pressure)   . Gout   . Gross hematuria   . Hemorrhoids   . HLD (hyperlipidemia)   . Hyperlipidemia, unspecified   . Hypertension   . Hypertrophy of breast   . Impotence of organic origin   . Incomplete bladder emptying   . Malignant neoplasm of prostate ([place])   . Osteoarthritis   . Other specified disorders of the male genital organs   . Prostate cancer ([place])   . PVD (peripheral vascular disease) ([place])   . Sciatica   . Spermatocele   . Spinal stenosis of lumbar region with radiculopathy 11/11/2015  . Stroke ([place])   . Testicular hypofunction    other  . Urinary frequency   . Urinary obstruction    not elsewhere classified  . Vertigo    Past Surgical History:  Procedure Laterality Date  . APPENDECTOMY    . CARDIAC CATHETERIZATION  11/2013  . FIDUCIAL SEED PLACEMENT  10/14/2009  . PROSTATE BIOPSY  08/11/2009  . RADIATION FOR PROSTATE CANCER    . TOTAL HIP ARTHROPLASTY Left 05/06/2017   Procedure: TOTAL HIP ARTHROPLASTY;  Surgeon: Dereck Leep, MD;  Location: [facility] ORS;  Service: Orthopedics;  Laterality: Left;     A IV Location/Drains/Wounds Patient Lines/Drains/Airways Status   Active Line/Drains/Airways    Name:   Placement date:   Placement time:   Site:   Days:   Peripheral IV 02/11/19 Left Hand  02/11/19    1400    Hand   less than 1   Closed System Drain Left Hip   05/06/17    1130    Hip   646   Incision (Closed) 05/06/17 Hip Left   05/06/17    0744     646          Intake/Output Last 24 hours No intake or output data in the 24 hours ending 02/11/19 1636  Labs/Imaging Results for orders placed or performed during the hospital encounter of 02/11/19 (from the past 48 hour(s))  CBC     Status: Abnormal   Collection Time: 02/11/19 12:52 PM  Result Value Ref Range   WBC 11.3 (H) 4.0 - 10.5 K/uL   RBC 5.00 4.22 - 5.81 MIL/uL   Hemoglobin 15.5 13.0 - 17.0 g/dL   HCT 47.3 39.0 - 52.0 %   MCV 94.6 80.0 - 100.0 fL    MCH 31.0 26.0 - 34.0 pg   MCHC 32.8 30.0 - 36.0 g/dL   RDW 14.3 11.5 - 15.5 %   Platelets 168 150 - 400 K/uL   nRBC 0.0 0.0 - 0.2 %    Comment: Performed at [facility], [address] [place], [place] [address]  Comprehensive metabolic panel     Status: Abnormal   Collection Time: 02/11/19 12:52 PM  Result Value Ref Range   Sodium 142 135 - 145 mmol/L   Potassium 4.1 3.5 - 5.1 mmol/L   Chloride 103 98 - 111 mmol/L   CO2 24 22 - 32 mmol/L   Glucose, Bld 189 (H) 70 - 99 mg/dL   BUN 14 8 - 23 mg/dL   Creatinine, Ser 1.15 0.61 - 1.24 mg/dL   Calcium 9.1 8.9 - 10.3 mg/dL   Total Protein 7.3 6.5 - 8.1 g/dL   Albumin 4.0 3.5 - 5.0 g/dL   AST 28 15 - 41 U/L   ALT 16 0 - 44 U/L   Alkaline Phosphatase 76 38 - 126 U/L   Total Bilirubin 0.7 0.3 - 1.2 mg/dL   GFR calc non Af Amer 58 (L) >60 mL/min   GFR calc Af Amer >60 >60 mL/min   Anion gap 15 5 - 15    Comment: Performed at [facility], [place], [place] [address]  Troponin I (High Sensitivity)     Status: Abnormal   Collection Time: 02/11/19 12:52 PM  Result Value Ref Range   Troponin I (High Sensitivity) 35 (H) <18 ng/L    Comment: (NOTE) Elevated high sensitivity troponin I (hsTnI) values and significant  changes across serial measurements may suggest ACS but many other  chronic and acute conditions are known to elevate hsTnI results.  Refer to the "Links" section for chest pain algorithms and additional  guidance. Performed at [facility], [address] [place], [place] [address]   Urinalysis, Complete w Microscopic     Status: Abnormal   Collection Time: 02/11/19  1:40 PM  Result Value Ref Range   Color, Urine YELLOW (A) YELLOW   APPearance CLEAR (A) CLEAR   Specific Gravity, Urine 1.019 1.005 - 1.030   pH 5.0 5.0 - 8.0   Glucose, UA NEGATIVE NEGATIVE mg/dL   Hgb urine dipstick NEGATIVE NEGATIVE   Bilirubin Urine NEGATIVE NEGATIVE   Ketones, ur 5 (A) NEGATIVE mg/dL    Protein, ur NEGATIVE NEGATIVE mg/dL   Nitrite NEGATIVE NEGATIVE   Leukocytes,Ua NEGATIVE NEGATIVE   RBC / HPF  0-5 0 - 5 RBC/hpf   WBC, UA 0-5 0 - 5 WBC/hpf   Bacteria, UA NONE SEEN NONE SEEN   Squamous Epithelial / LPF NONE SEEN 0 - 5   Mucus PRESENT     Comment: Performed at [facility], [address], [place], [place] [address]  Troponin I (High Sensitivity)     Status: Abnormal   Collection Time: 02/11/19  2:49 PM  Result Value Ref Range   Troponin I (High Sensitivity) 86 (H) <18 ng/L    Comment: CRITICAL RESULT CALLED TO, READ BACK BY AND VERIFIED WITH MITCH Lynnleigh Soden AT 1540 ON 02/11/2019 [place]. (NOTE) Elevated high sensitivity troponin I (hsTnI) values and significant  changes across serial measurements may suggest ACS but many other  chronic and acute conditions are known to elevate hsTnI results.  Refer to the "Links" section for chest pain algorithms and additional  guidance. Performed at [facility], [address] [place], [place] [address]    Ct Head Wo Contrast  Result Date: 02/11/2019 CLINICAL DATA:  Seizures. EXAM: CT HEAD WITHOUT CONTRAST TECHNIQUE: Contiguous axial images were obtained from the base of the skull through the vertex without intravenous contrast. COMPARISON:  CT scan of June 22, 2018. FINDINGS: Brain: Ventricular size is within normal limits. No midline shift is noted. Interval development of several right-sided subcortical white matter low densities are noted concerning for infarction of indeterminate age or edema due to other pathology. No hemorrhage is noted. Vascular: No hyperdense vessel or unexpected calcification. Skull: Normal. Negative for fracture or focal lesion. Sinuses/Orbits: No acute finding. Other: None. IMPRESSION: Interval development of several ill-defined right sided subcortical white matter low densities concerning for infarction of indeterminate age or edema due to other pathology. MRI is recommended for further  evaluation. Electronically Signed   By: Marijo Conception M.D.   On: 02/11/2019 13:31    Pending Labs Unresulted Labs (From admission, onward)    Start     Ordered   02/11/19 1303  Novel Coronavirus, NAA (Hosp order, Send-out to [facility]; TAT 18-24 hrs  (Symptomatic/High Risk of Exposure/Tier 1 Patients Labs with Precautions)  Once,   STAT    Question Answer Comment  Is this test for diagnosis or screening Diagnosis of ill patient   Symptomatic for COVID-19 as defined by CDC No   Hospitalized for COVID-19 No   Admitted to ICU for COVID-19 No   Previously tested for COVID-19 No   Resident in a congregate (group) care setting No   Employed in healthcare setting No      02/11/19 1302   Signed and Held  Hemoglobin A1c  Tomorrow morning,   R     Signed and Held   Signed and Held  Lipid panel  Tomorrow morning,   R    Comments: Fasting    Signed and Held   Signed and Held  Hemoglobin A1c  Once,   R    Comments: To assess prior glycemic control    Signed and Held   Signed and Held  CBC  Tomorrow morning,   R     Signed and Held   Signed and Held  Basic metabolic panel  Tomorrow morning,   R     Signed and Held          Vitals/Pain Today's Vitals   02/11/19 1241 02/11/19 1242 02/11/19 1243 02/11/19 1330  Pulse: 87   97  Resp:    (!) 24  SpO2:  98%  Weight:   103.6 kg   Height:   5\' 11"  (1.803 m)   PainSc:  0-No pain      Isolation Precautions No active isolations  Medications Medications  valproate (DEPACON) 2,000 mg in dextrose 5 % 50 mL IVPB (2,000 mg Intravenous New Bag/Given 02/11/19 1540)  valproate (DEPACON) 518 mg in dextrose 5 % 50 mL IVPB (has no administration in time range)  LORazepam (ATIVAN) injection 2 mg (2 mg Intramuscular Given 02/11/19 1355)    Mobility non-ambulatory High fall risk   Focused Assessments see previous assessment   R Recommendations: See Admitting Provider Note  Report given to: [place]  Additional Notes:

## 2019-02-12 ENCOUNTER — Inpatient Hospital Stay (HOSPITAL_COMMUNITY): Payer: Medicare Other

## 2019-02-12 ENCOUNTER — Encounter: Payer: Self-pay | Admitting: Radiology

## 2019-02-12 ENCOUNTER — Inpatient Hospital Stay: Payer: Medicare Other

## 2019-02-12 ENCOUNTER — Encounter (HOSPITAL_COMMUNITY): Payer: Medicare Other

## 2019-02-12 ENCOUNTER — Inpatient Hospital Stay (HOSPITAL_COMMUNITY)
Admission: AD | Admit: 2019-02-12 | Discharge: 2019-03-19 | DRG: 054 | Disposition: E | Payer: Medicare Other | Source: Other Acute Inpatient Hospital | Attending: Internal Medicine | Admitting: Internal Medicine

## 2019-02-12 DIAGNOSIS — I2510 Atherosclerotic heart disease of native coronary artery without angina pectoris: Secondary | ICD-10-CM | POA: Diagnosis present

## 2019-02-12 DIAGNOSIS — I5032 Chronic diastolic (congestive) heart failure: Secondary | ICD-10-CM | POA: Diagnosis present

## 2019-02-12 DIAGNOSIS — G40901 Epilepsy, unspecified, not intractable, with status epilepticus: Secondary | ICD-10-CM | POA: Diagnosis not present

## 2019-02-12 DIAGNOSIS — I6523 Occlusion and stenosis of bilateral carotid arteries: Secondary | ICD-10-CM | POA: Diagnosis present

## 2019-02-12 DIAGNOSIS — C719 Malignant neoplasm of brain, unspecified: Secondary | ICD-10-CM | POA: Diagnosis present

## 2019-02-12 DIAGNOSIS — F329 Major depressive disorder, single episode, unspecified: Secondary | ICD-10-CM | POA: Diagnosis present

## 2019-02-12 DIAGNOSIS — Z6831 Body mass index (BMI) 31.0-31.9, adult: Secondary | ICD-10-CM | POA: Diagnosis not present

## 2019-02-12 DIAGNOSIS — Z87891 Personal history of nicotine dependence: Secondary | ICD-10-CM | POA: Diagnosis not present

## 2019-02-12 DIAGNOSIS — R29711 NIHSS score 11: Secondary | ICD-10-CM | POA: Diagnosis present

## 2019-02-12 DIAGNOSIS — G936 Cerebral edema: Secondary | ICD-10-CM | POA: Diagnosis present

## 2019-02-12 DIAGNOSIS — R069 Unspecified abnormalities of breathing: Secondary | ICD-10-CM

## 2019-02-12 DIAGNOSIS — G8194 Hemiplegia, unspecified affecting left nondominant side: Secondary | ICD-10-CM | POA: Diagnosis present

## 2019-02-12 DIAGNOSIS — G9389 Other specified disorders of brain: Secondary | ICD-10-CM

## 2019-02-12 DIAGNOSIS — J9601 Acute respiratory failure with hypoxia: Secondary | ICD-10-CM | POA: Diagnosis present

## 2019-02-12 DIAGNOSIS — Z8546 Personal history of malignant neoplasm of prostate: Secondary | ICD-10-CM

## 2019-02-12 DIAGNOSIS — F419 Anxiety disorder, unspecified: Secondary | ICD-10-CM | POA: Diagnosis present

## 2019-02-12 DIAGNOSIS — F0390 Unspecified dementia without behavioral disturbance: Secondary | ICD-10-CM | POA: Diagnosis present

## 2019-02-12 DIAGNOSIS — Z8249 Family history of ischemic heart disease and other diseases of the circulatory system: Secondary | ICD-10-CM

## 2019-02-12 DIAGNOSIS — G9340 Encephalopathy, unspecified: Secondary | ICD-10-CM | POA: Diagnosis not present

## 2019-02-12 DIAGNOSIS — Z794 Long term (current) use of insulin: Secondary | ICD-10-CM | POA: Diagnosis not present

## 2019-02-12 DIAGNOSIS — Z7189 Other specified counseling: Secondary | ICD-10-CM

## 2019-02-12 DIAGNOSIS — R569 Unspecified convulsions: Secondary | ICD-10-CM | POA: Diagnosis not present

## 2019-02-12 DIAGNOSIS — K219 Gastro-esophageal reflux disease without esophagitis: Secondary | ICD-10-CM | POA: Diagnosis present

## 2019-02-12 DIAGNOSIS — N400 Enlarged prostate without lower urinary tract symptoms: Secondary | ICD-10-CM | POA: Diagnosis present

## 2019-02-12 DIAGNOSIS — G9341 Metabolic encephalopathy: Secondary | ICD-10-CM | POA: Diagnosis present

## 2019-02-12 DIAGNOSIS — Y92239 Unspecified place in hospital as the place of occurrence of the external cause: Secondary | ICD-10-CM | POA: Diagnosis not present

## 2019-02-12 DIAGNOSIS — E860 Dehydration: Secondary | ICD-10-CM | POA: Diagnosis present

## 2019-02-12 DIAGNOSIS — E118 Type 2 diabetes mellitus with unspecified complications: Secondary | ICD-10-CM | POA: Diagnosis not present

## 2019-02-12 DIAGNOSIS — Z66 Do not resuscitate: Secondary | ICD-10-CM | POA: Diagnosis not present

## 2019-02-12 DIAGNOSIS — I110 Hypertensive heart disease with heart failure: Secondary | ICD-10-CM | POA: Diagnosis present

## 2019-02-12 DIAGNOSIS — G40409 Other generalized epilepsy and epileptic syndromes, not intractable, without status epilepticus: Secondary | ICD-10-CM | POA: Diagnosis present

## 2019-02-12 DIAGNOSIS — I63531 Cerebral infarction due to unspecified occlusion or stenosis of right posterior cerebral artery: Secondary | ICD-10-CM | POA: Diagnosis present

## 2019-02-12 DIAGNOSIS — E873 Alkalosis: Secondary | ICD-10-CM | POA: Diagnosis not present

## 2019-02-12 DIAGNOSIS — Z923 Personal history of irradiation: Secondary | ICD-10-CM | POA: Diagnosis not present

## 2019-02-12 DIAGNOSIS — G40101 Localization-related (focal) (partial) symptomatic epilepsy and epileptic syndromes with simple partial seizures, not intractable, with status epilepticus: Secondary | ICD-10-CM | POA: Diagnosis present

## 2019-02-12 DIAGNOSIS — E669 Obesity, unspecified: Secondary | ICD-10-CM | POA: Diagnosis present

## 2019-02-12 DIAGNOSIS — W19XXXA Unspecified fall, initial encounter: Secondary | ICD-10-CM | POA: Diagnosis present

## 2019-02-12 DIAGNOSIS — E0865 Diabetes mellitus due to underlying condition with hyperglycemia: Secondary | ICD-10-CM | POA: Diagnosis not present

## 2019-02-12 DIAGNOSIS — G40109 Localization-related (focal) (partial) symptomatic epilepsy and epileptic syndromes with simple partial seizures, not intractable, without status epilepticus: Secondary | ICD-10-CM | POA: Diagnosis not present

## 2019-02-12 DIAGNOSIS — Z515 Encounter for palliative care: Secondary | ICD-10-CM

## 2019-02-12 DIAGNOSIS — I10 Essential (primary) hypertension: Secondary | ICD-10-CM | POA: Diagnosis not present

## 2019-02-12 DIAGNOSIS — Z20828 Contact with and (suspected) exposure to other viral communicable diseases: Secondary | ICD-10-CM | POA: Diagnosis present

## 2019-02-12 DIAGNOSIS — H53462 Homonymous bilateral field defects, left side: Secondary | ICD-10-CM | POA: Diagnosis present

## 2019-02-12 DIAGNOSIS — M1990 Unspecified osteoarthritis, unspecified site: Secondary | ICD-10-CM | POA: Diagnosis present

## 2019-02-12 DIAGNOSIS — Z888 Allergy status to other drugs, medicaments and biological substances status: Secondary | ICD-10-CM

## 2019-02-12 DIAGNOSIS — E785 Hyperlipidemia, unspecified: Secondary | ICD-10-CM | POA: Diagnosis present

## 2019-02-12 DIAGNOSIS — E1165 Type 2 diabetes mellitus with hyperglycemia: Secondary | ICD-10-CM | POA: Diagnosis not present

## 2019-02-12 DIAGNOSIS — E1151 Type 2 diabetes mellitus with diabetic peripheral angiopathy without gangrene: Secondary | ICD-10-CM | POA: Diagnosis present

## 2019-02-12 DIAGNOSIS — E119 Type 2 diabetes mellitus without complications: Secondary | ICD-10-CM | POA: Diagnosis not present

## 2019-02-12 DIAGNOSIS — Z79899 Other long term (current) drug therapy: Secondary | ICD-10-CM | POA: Diagnosis not present

## 2019-02-12 DIAGNOSIS — Z96642 Presence of left artificial hip joint: Secondary | ICD-10-CM | POA: Diagnosis present

## 2019-02-12 DIAGNOSIS — R000 Tachycardia, unspecified: Secondary | ICD-10-CM | POA: Diagnosis not present

## 2019-02-12 DIAGNOSIS — T380X5A Adverse effect of glucocorticoids and synthetic analogues, initial encounter: Secondary | ICD-10-CM | POA: Diagnosis not present

## 2019-02-12 LAB — BASIC METABOLIC PANEL
Anion gap: 13 (ref 5–15)
BUN: 17 mg/dL (ref 8–23)
CO2: 26 mmol/L (ref 22–32)
Calcium: 9.7 mg/dL (ref 8.9–10.3)
Chloride: 101 mmol/L (ref 98–111)
Creatinine, Ser: 1.07 mg/dL (ref 0.61–1.24)
GFR calc Af Amer: >60 mL/min (ref >60)
GFR calc non Af Amer: >60 mL/min (ref >60)
Glucose, Bld: 151 mg/dL — ABNORMAL HIGH (ref 70–99)
Potassium: 5.9 mmol/L — ABNORMAL HIGH (ref 3.5–5.1)
Sodium: 140 mmol/L (ref 135–145)

## 2019-02-12 LAB — CBC
HCT: 47.8 % (ref 39.0–52.0)
Hemoglobin: 15.5 g/dL (ref 13.0–17.0)
MCH: 30.9 pg (ref 26.0–34.0)
MCHC: 32.4 g/dL (ref 30.0–36.0)
MCV: 95.2 fL (ref 80.0–100.0)
Platelets: 185 10*3/uL (ref 150–400)
RBC: 5.02 MIL/uL (ref 4.22–5.81)
RDW: 14.6 % (ref 11.5–15.5)
WBC: 12.2 10*3/uL — ABNORMAL HIGH (ref 4.0–10.5)
nRBC: 0 % (ref 0.0–0.2)

## 2019-02-12 LAB — HEMOGLOBIN A1C
Hgb A1c MFr Bld: 6.4 % — ABNORMAL HIGH (ref 4.8–5.6)
Hgb A1c MFr Bld: 6.5 % — ABNORMAL HIGH (ref 4.8–5.6)
Mean Plasma Glucose: 136.98 mg/dL
Mean Plasma Glucose: 139.85 mg/dL

## 2019-02-12 LAB — GLUCOSE, CAPILLARY
Glucose-Capillary: 116 mg/dL — ABNORMAL HIGH (ref 70–99)
Glucose-Capillary: 133 mg/dL — ABNORMAL HIGH (ref 70–99)
Glucose-Capillary: 162 mg/dL — ABNORMAL HIGH (ref 70–99)
Glucose-Capillary: 229 mg/dL — ABNORMAL HIGH (ref 70–99)

## 2019-02-12 LAB — LIPID PANEL
Cholesterol: 134 mg/dL (ref 0–200)
HDL: 50 mg/dL (ref >40)
LDL Cholesterol: 69 mg/dL (ref 0–99)
Total CHOL/HDL Ratio: 2.7 RATIO
Triglycerides: 77 mg/dL (ref <150)
VLDL: 15 mg/dL (ref 0–40)

## 2019-02-12 LAB — NOVEL CORONAVIRUS, NAA (HOSP ORDER, SEND-OUT TO REF LAB; TAT 18-24 HRS): SARS-CoV-2, NAA: NOT DETECTED

## 2019-02-12 MED ORDER — GADOBUTROL 1 MMOL/ML IV SOLN
10.0000 mL | Freq: Once | INTRAVENOUS | Status: AC | PRN
  Administered 2019-02-12: 10 mL via INTRAVENOUS

## 2019-02-12 MED ORDER — PATIROMER SORBITEX CALCIUM 8.4 G PO PACK
8.4000 g | PACK | Freq: Once | ORAL | Status: AC
  Administered 2019-02-12: 09:00:00 8.4 g via ORAL
  Filled 2019-02-12: qty 1

## 2019-02-12 MED ORDER — VALPROATE SODIUM 500 MG/5ML IV SOLN
500.0000 mg | Freq: Three times a day (TID) | INTRAVENOUS | Status: DC
  Administered 2019-02-12 – 2019-02-13 (×3): 500 mg via INTRAVENOUS
  Filled 2019-02-12 (×5): qty 5

## 2019-02-12 MED ORDER — INSULIN ASPART 100 UNIT/ML ~~LOC~~ SOLN
0.0000 [IU] | Freq: Three times a day (TID) | SUBCUTANEOUS | Status: DC
  Administered 2019-02-13 (×2): 2 [IU] via SUBCUTANEOUS
  Administered 2019-02-13: 3 [IU] via SUBCUTANEOUS
  Administered 2019-02-14: 1 [IU] via SUBCUTANEOUS
  Administered 2019-02-14 – 2019-02-15 (×3): 2 [IU] via SUBCUTANEOUS
  Administered 2019-02-15: 3 [IU] via SUBCUTANEOUS
  Administered 2019-02-15 – 2019-02-16 (×4): 2 [IU] via SUBCUTANEOUS
  Administered 2019-02-17: 08:00:00 3 [IU] via SUBCUTANEOUS
  Administered 2019-02-17 – 2019-02-18 (×3): 2 [IU] via SUBCUTANEOUS
  Administered 2019-02-18: 12:00:00 5 [IU] via SUBCUTANEOUS
  Administered 2019-02-18 – 2019-02-19 (×3): 3 [IU] via SUBCUTANEOUS

## 2019-02-12 MED ORDER — BENAZEPRIL HCL 20 MG PO TABS
20.0000 mg | ORAL_TABLET | Freq: Every day | ORAL | Status: DC
  Administered 2019-02-13: 20 mg via ORAL
  Filled 2019-02-12: qty 1

## 2019-02-12 MED ORDER — DEXAMETHASONE SODIUM PHOSPHATE 10 MG/ML IJ SOLN
10.0000 mg | Freq: Once | INTRAMUSCULAR | Status: AC
  Administered 2019-02-12: 13:00:00 10 mg via INTRAVENOUS
  Filled 2019-02-12: qty 1

## 2019-02-12 MED ORDER — LORAZEPAM 2 MG/ML IJ SOLN
1.0000 mg | INTRAMUSCULAR | Status: DC | PRN
  Administered 2019-02-16: 1 mg via INTRAVENOUS
  Filled 2019-02-12: qty 1

## 2019-02-12 MED ORDER — CLONAZEPAM 0.25 MG PO TBDP
0.2500 mg | ORAL_TABLET | Freq: Three times a day (TID) | ORAL | Status: DC
  Administered 2019-02-12 – 2019-02-13 (×2): 0.25 mg via ORAL
  Filled 2019-02-12 (×2): qty 1

## 2019-02-12 MED ORDER — ALBUTEROL SULFATE (2.5 MG/3ML) 0.083% IN NEBU
2.5000 mg | INHALATION_SOLUTION | Freq: Once | RESPIRATORY_TRACT | Status: AC
  Administered 2019-02-12: 08:00:00 2.5 mg via RESPIRATORY_TRACT
  Filled 2019-02-12: qty 3

## 2019-02-12 MED ORDER — SODIUM BICARBONATE 8.4 % IV SOLN
50.0000 meq | Freq: Once | INTRAVENOUS | Status: AC
  Administered 2019-02-12: 08:00:00 50 meq via INTRAVENOUS
  Filled 2019-02-12: qty 50

## 2019-02-12 MED ORDER — SODIUM POLYSTYRENE SULFONATE 15 GM/60ML PO SUSP
15.0000 g | Freq: Once | ORAL | Status: AC
  Administered 2019-02-12: 15 g via ORAL
  Filled 2019-02-12: qty 60

## 2019-02-12 MED ORDER — INSULIN ASPART 100 UNIT/ML IV SOLN
10.0000 [IU] | Freq: Once | INTRAVENOUS | Status: AC
  Administered 2019-02-12: 09:00:00 10 [IU] via INTRAVENOUS
  Filled 2019-02-12: qty 0.1

## 2019-02-12 MED ORDER — SODIUM CHLORIDE 0.9 % IV SOLN
2000.0000 mg | INTRAVENOUS | Status: AC
  Administered 2019-02-12: 2000 mg via INTRAVENOUS
  Filled 2019-02-12: qty 20

## 2019-02-12 MED ORDER — LEVETIRACETAM IN NACL 500 MG/100ML IV SOLN
500.0000 mg | Freq: Two times a day (BID) | INTRAVENOUS | Status: DC
  Administered 2019-02-13: 500 mg via INTRAVENOUS
  Filled 2019-02-12 (×2): qty 100

## 2019-02-12 MED ORDER — VALPROATE SODIUM 500 MG/5ML IV SOLN: 15.0000 mg/kg/d | Freq: Three times a day (TID) | INTRAVENOUS | Status: AC

## 2019-02-12 MED ORDER — ALBUTEROL SULFATE (2.5 MG/3ML) 0.083% IN NEBU
2.5000 mg | INHALATION_SOLUTION | Freq: Once | RESPIRATORY_TRACT | Status: AC
  Administered 2019-02-12: 2.5 mg via RESPIRATORY_TRACT
  Filled 2019-02-12: qty 3

## 2019-02-12 MED ORDER — CLONAZEPAM 0.125 MG PO TBDP
0.2500 mg | ORAL_TABLET | Freq: Two times a day (BID) | ORAL | Status: DC
  Administered 2019-02-12: 09:00:00 0.25 mg via ORAL
  Filled 2019-02-12: qty 2

## 2019-02-12 MED ORDER — DEXTROSE 50 % IV SOLN
1.0000 | Freq: Once | INTRAVENOUS | Status: AC
  Administered 2019-02-12: 50 mL via INTRAVENOUS
  Filled 2019-02-12: qty 50

## 2019-02-12 MED ORDER — INSULIN ASPART 100 UNIT/ML ~~LOC~~ SOLN
0.0000 [IU] | Freq: Every day | SUBCUTANEOUS | Status: DC
  Administered 2019-02-18: 23:00:00 2 [IU] via SUBCUTANEOUS

## 2019-02-12 MED ORDER — ASPIRIN EC 81 MG PO TBEC
81.0000 mg | DELAYED_RELEASE_TABLET | Freq: Every day | ORAL | Status: DC
  Administered 2019-02-13: 81 mg via ORAL
  Filled 2019-02-12 (×2): qty 1

## 2019-02-12 MED ORDER — CLONAZEPAM 0.25 MG PO TBDP: 0.2500 mg | ORAL_TABLET | Freq: Two times a day (BID) | ORAL | 0 refills | Status: AC

## 2019-02-12 MED ORDER — LORAZEPAM 2 MG/ML IJ SOLN: 1.0000 mg | INTRAMUSCULAR | 0 refills | Status: AC | PRN

## 2019-02-12 MED ORDER — AMLODIPINE BESYLATE 5 MG PO TABS
5.0000 mg | ORAL_TABLET | Freq: Every day | ORAL | Status: DC
  Administered 2019-02-13: 5 mg via ORAL
  Filled 2019-02-12: qty 1

## 2019-02-12 MED ORDER — DEXAMETHASONE SODIUM PHOSPHATE 4 MG/ML IJ SOLN
4.0000 mg | Freq: Four times a day (QID) | INTRAMUSCULAR | Status: DC
  Administered 2019-02-12 – 2019-02-19 (×27): 4 mg via INTRAVENOUS
  Filled 2019-02-12 (×27): qty 1

## 2019-02-12 NOTE — Consult Note (Signed)
Neurology Consultation  Reason for Consult: Seizures, new brain mass Referring Physician: Dr. Evangeline Gula  CC: Seizure-like activity  History is obtained from: Review of the chart, patient  HPI: Darrell Clark is a 85 y.o. male past medical history of dementia CHF depression hypertension hyperlipidemia, presented to [place] ER on 826 with witnessed seizure-like activity.  He was found on the floor by his family with seizure-like activity.  Last normal baseline around 8 AM on 02/11/2019 when he was witnessed by his son to be normal.  Another friend came to check over on him and found him on the floor.  EMS reported he had focal seizures on the left side throughout the transport and was given 2 mg of Versed.  Patient had a fall about 2 days ago and hit his head presumably.  In the ED he continued to have jerking movements of the left side.  Labs at that time showed leukocytosis 11.3 WBC and and head CT done without contrast showed some interval development of ill-defined right-sided subcortical hypodensities concerning for infarction or edema. MR brain without contrast showed cortical subcortical edema within the right occipital lobe at the right frontoparietal junction concerning for subacute infarction or neoplastic process.  This was followed by an MRI brain with contrast which showed moderate area of enhancement in the right occipital lobe and small areas of enhancement in the right frontoparietal white matter which is less extensive than the extent of the T2 signal change-with differentials favoring a mass especially GBM.  A subacute right PCA infarct with watershed ischemia in the right cerebral white matter is a differential consideration but given the necrotic enhancement pattern, and mass was favored.  A follow-up MRI in 2 to 3 weeks is recommended.  He was transferred to [facility] because of the need for EEG which was not available at [facility] and potentially LTM monitoring  because of the continuing left-sided jerking movements concerning for focal status epilepticus.  He was loaded with Depakote 20 mg/kg and started on Depakote 50 mg/kg/day in 3 divided doses at [facility].  Also recommended Klonopin.   ROS: ROS was performed and is negative except as noted in the HPI.   Past Medical History:  Diagnosis Date  . Acquired cyst of kidney   . Allergy   . Anxiety   . Arthritis   . Atherosclerosis   . BPH (benign prostatic hyperplasia)   . CAD (coronary artery disease)   . Cardiac arrhythmia   . CHF (congestive heart failure) ([place])   . Chronic prostatitis   . Degenerative arthritis   . Dementia ([place])    mild  . Depression   . Diabetes mellitus without complication ([place])   . Dyspnea    with exertion  . Elevated prostate specific antigen (PSA)   . Encysted hydrocele   . GERD (gastroesophageal reflux disease)   . Glaucoma (increased eye pressure)   . Gout   . Gross hematuria   . Hemorrhoids   . HLD (hyperlipidemia)   . Hyperlipidemia, unspecified   . Hypertension   . Hypertrophy of breast   . Impotence of organic origin   . Incomplete bladder emptying   . Malignant neoplasm of prostate ([place])   . Osteoarthritis   . Other specified disorders of the male genital organs   . Prostate cancer ([place])   . PVD (peripheral vascular disease) ([place])   . Sciatica   . Spermatocele   . Spinal stenosis of lumbar region with radiculopathy 11/11/2015  .  Stroke ([place])   . Testicular hypofunction    other  . Urinary frequency   . Urinary obstruction    not elsewhere classified  . Vertigo      Family History  Problem Relation Age of Onset  . Heart attack Mother   . Hypertension Mother   . Heart attack Father   . Hypertension Father   . Hypertension Sister   . Cancer Brother   . Hypertension Brother   . Hypertension Brother   . Hypertension Brother   . Hypertension Sister   . Kidney cancer Neg Hx   . Prostate cancer Neg Hx   . GU problems Neg  Hx    Social History:   reports that he quit smoking about 11 years ago. His smoking use included cigarettes. He has a 50.00 pack-year smoking history. He has never used smokeless tobacco. He reports that he does not drink alcohol or use drugs.  Medications No current facility-administered medications for this encounter.    Exam: Current vital signs: There were no vitals taken for this visit. Vital signs in last 24 hours: Temp:  [97.5 F (36.4 C)-99 F (37.2 C)] 99 F (37.2 C) (08/27 1853) Pulse Rate:  [69-101] 94 (08/27 1853) Resp:  [16-20] 17 (08/27 1853) BP: (130-159)/(59-113) 130/113 (08/27 1853) SpO2:  [95 %-100 %] 97 % (08/27 1853) General: Awake alert HEENT: Normocephalic atraumatic CVS: Regular rate rhythm Respiratory: Breathing normally saturating well on room air Extremities: Warm well perfused Neurological exam Awake alert oriented x3 Speech is dysarthric. No aphasia Cranial nerves: Pupils are round and reactive to light- less on the left which is baseline per documentation, has left hemianopsia, he has a right gaze preference and does not cross to look to the left, facial symmetry looks preserved, he has right head version, palate and tongue midline. Motor exam: He is having a difficult time manipulating his left upper and lower extremity and is able to give me at his best ability 2/5 on left upper and left lower extremity.  Right upper and lower extremity 5/5. Sensory exam: No difference in sensation when tested individually on right or left but does neglect the left side on double 70s stimulation Coordination: Difficult to assess Gait testing deferred  Labs I have reviewed labs in epic and the results pertinent to this consultation are:  CBC    Component Value Date/Time   WBC 12.2 (H) 02/01/2019 0540   RBC 5.02 01/22/2019 0540   HGB 15.5 01/24/2019 0540   HGB 16.2 05/28/2013 1215   HCT 47.8 02/03/2019 0540   HCT 48.7 05/28/2013 1215   PLT 185 01/30/2019  0540   PLT 174 05/28/2013 1215   MCV 95.2 02/13/2019 0540   MCV 94 05/28/2013 1215   MCH 30.9 02/04/2019 0540   MCHC 32.4 02/07/2019 0540   RDW 14.6 01/24/2019 0540   RDW 14.5 05/28/2013 1215   LYMPHSABS 2.3 07/12/2018 1339   LYMPHSABS 2.1 05/28/2013 1215   MONOABS 0.8 07/12/2018 1339   MONOABS 0.9 05/28/2013 1215   EOSABS 0.2 07/12/2018 1339   EOSABS 0.3 05/28/2013 1215   BASOSABS 0.1 07/12/2018 1339   BASOSABS 0.0 05/28/2013 1215    CMP     Component Value Date/Time   NA 140 01/25/2019 0540   NA 141 05/28/2013 1215   K 5.9 (H) 02/11/2019 0540   K 3.8 05/28/2013 1215   CL 101 01/29/2019 0540   CL 108 (H) 05/28/2013 1215   CO2 26 01/23/2019 0540   CO2  28 05/28/2013 1215   GLUCOSE 151 (H) 01/21/2019 0540   GLUCOSE 83 05/28/2013 1215   BUN 17 01/31/2019 0540   BUN 12 05/28/2013 1215   CREATININE 1.07 01/30/2019 0540   CREATININE 0.93 05/28/2013 1215   CALCIUM 9.7 02/11/2019 0540   CALCIUM 9.4 05/28/2013 1215   PROT 7.3 02/11/2019 1252   PROT 7.5 05/28/2013 1215   ALBUMIN 4.0 02/11/2019 1252   ALBUMIN 3.7 05/28/2013 1215   AST 28 02/11/2019 1252   AST 26 05/28/2013 1215   ALT 16 02/11/2019 1252   ALT 25 05/28/2013 1215   ALKPHOS 76 02/11/2019 1252   ALKPHOS 102 05/28/2013 1215   BILITOT 0.7 02/11/2019 1252   BILITOT 0.8 05/28/2013 1215   GFRNONAA >60 02/08/2019 0540   GFRNONAA >60 05/28/2013 1215   GFRAA >60 02/02/2019 0540   GFRAA >60 05/28/2013 1215    Lipid Panel     Component Value Date/Time   CHOL 134 01/17/2019 0540   TRIG 77 01/17/2019 0540   HDL 50 01/29/2019 0540   CHOLHDL 2.7 01/30/2019 0540   VLDL 15 02/13/2019 0540   LDLCALC 69 02/04/2019 0540     Imaging I have reviewed the images obtained: MRI of the brain without and MRI of the brain done subsequently reviewed. MRI of the brain showed cortical subcortical edema within the right occipital lobe and at the right frontoparietal junction concerning for subacute infarction neoplastic  process.  MRI with contrast showed moderate area of enhancement in the right occipital lobe and small areas of enhancement in the right frontoparietal white matter less extensive than the extent of T2 signal change with differential of a GBM higher than a subacute infarct due to the necrotic enhancement pattern which is typical for GBM.  Assessment: 85 year old man with above past medical history presented for seizure-like activity.  Continues to have left-sided jerking movements and has weakness and neglect of the left hemibody. MRI concerning for GBM in the right occipital lobe plus/minus stroke in that area as well. 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 Klonopin-0.25 3 times daily Dexamethasone 61milligrams every 6 hours IV Neurology will continue to follow with you.  -- Amie Portland, MD [facility] Pager: [phone] If 7pm to 7am, please call on call as listed on AMION.  CRITICAL CARE ATTESTATION Performed by: Amie Portland, MD Total critical care time: 45 minutes Critical care time was exclusive of separately billable procedures and treating other patients and/or supervising APPs/Residents/Students Critical care was necessary to treat or prevent imminent or life-threatening deterioration due to epilepsia partialis continua, possible new GBM This patient is critically ill and at significant risk for neurological worsening and/or death and care requires constant monitoring. Critical care was time spent personally by me on the following activities: development of treatment plan with  patient and/or surrogate as well as nursing, discussions with consultants, evaluation of patient's response to treatment, examination of patient, obtaining history from patient or surrogate, ordering and performing treatments and interventions, ordering and review of laboratory studies, ordering and review of radiographic studies, pulse oximetry, re-evaluation of patient's condition, participation in multidisciplinary rounds and medical decision making of high complexity in the care  of this patient.

## 2019-02-12 NOTE — Progress Notes (Signed)
Stat EEG completed, results pending.  LTM started per order. Nursing staff educated.

## 2019-02-12 NOTE — Progress Notes (Signed)
PT Cancellation Note  Patient Details Name: Darrell Clark MRN: [id number] DOB: [birth year]   Cancelled Treatment:    Reason Eval/Treat Not Completed: Patient not medically ready.  PT consult received.  Chart reviewed.  Pt's potassium noted to be 5.9 this morning; per PT guidelines for elevated potassium, pt currently not medically appropriate for PT session.  Will monitor pt's status and re-attempt PT evaluation at a later date/time as medically appropriate.  Leitha Bleak, PT 01/29/2019, 11:11 AM [phone]

## 2019-02-12 NOTE — Progress Notes (Signed)
Advanced care plan.  Purpose of the Encounter: CODE STATUS  Parties in Attendance: Patient son over the phone Darrell Clark [id number]  Patient's Decision Capacity: Limited Subjective/Patient's story: Patient is 85 year old with multiple medical problems including previous CVA, hypertension, hyperlipidemia,  GERD and coronary artery disease who is admitted with myoclonic jerks concerning for seizure and MRI shows possible glioblastoma   Objective/Medical story I discussed with the son regarding his father's wishes for CPR and intubation  Goals of care determination:  Patient son wants his father to be full code for now   CODE STATUS: Full code   Time spent discussing advanced care planning: 16 minutes

## 2019-02-12 NOTE — Progress Notes (Signed)
Pt with seizures and possible brain tumor suspicious for glioblastoma.  No EEG at [place] this afternoon.Pt accepted in transfer pending step down bed

## 2019-02-12 NOTE — Progress Notes (Signed)
Neurology Follow-up Progress Note [place] at [facility]  Date of Admission: 02/11/2019   ASSESSMENT:  Darrell Clark is a 85 y.o. year old male with past medical history significant for  dementia, DM, CHF, depression, HTN, HLD who presents to the hospital via EMS with frequent myoclonic jerks. HCT w/o contrast demonstrated multiple hypodensities in the right hemisphere concerning for ischemia vs. Possible mass. On initial exam, patient has severe left hemibody neglect so difficult to assess his strength on this side. MRI brain w/o was completed and demonstrated no restricted diffusion. However, it did demonstrate new areas of cortical/subcortical edema within the right occipital lobe and at the right frontoparietal junction. Unclear if these are subacute infarcts vs. Neoplasm vs. Infection. Since symptoms occurred acutely, unlikely to be a subacute infarct.   Patient was loaded with Depacon in the ED and did demonstrate some improvements with the myoclonic jerks.  They are not as frequent and intense now. Will add Klonopin. Will need to do MRI brain w/ contrast and get EEG as soon as possible.   PLAN: - Will get EEG - if not able to get it soon enough may need to transfer to [facility] to get continuous EEG - MRI brain w/ contrast ordered - Will add Klonopin 0.25 mg BID - Continue with Depacon 15 mg/kg/day divided TID - Neurology will continue to follow along    SUBJECTIVE:  Myoclonic jerks have improved with Depacon but still present this morning.    OBJECTIVE: Vital Signs Temp:  [97.5 F (36.4 C)-99 F (37.2 C)] 97.7 F (36.5 C) (08/27 0640) Pulse Rate:  [69-105] 78 (08/27 0640) Resp:  [17-24] 20 (08/27 0640) BP: (133-155)/(59-108) 133/59 (08/27 0640) SpO2:  [95 %-100 %] 98 % (08/27 0640) Weight:  [103.6 kg] 103.6 kg (08/26 1243)   Physical Exam:  GENERAL:  No acute distress.   MENTAL STATUS EXAM:    Orientation: Alert and oriented to person, place and time.   Memory: Cooperative, follows commands well. Language: Speech is dysarthric and language is normal.   CRANIAL NERVES:    CN 2 (Optic): Appears to have left visual field neglect.  CN 3,4,6 (EOM): Pupils are reactive on the right. Minimally reactive on the left (baseline). Right gaze preference. Does not cross midline.  CN 5 (Trigeminal): Facial sensation is normal, no weakness of masticatory muscles.  CN 7 (Facial): Mild left facial weakness  CN 8 (Auditory): Auditory acuity grossly impaired.  CN 9,10 (Glossophar): The uvula is midline, the palate elevates symmetrically.  CN 11 (spinal access): Head is turned towards the right CN 12 (Hypoglossal): The tongue is midline. No atrophy or fasciculations.Marland Kitchen   MOTOR:  Muscle Strength: Strength - 5/5 on the RHB. Still neglecting the left upper/lower extremity. Did attempt to raise hise left arm on commands. Left lower extremity appears to be at least anti-gravity   REFLEXES: DTRs - 3+ brisk throughout   SENSATION: +Extinction. Neglecting his left hemibody.    Labs: I've reviewed the labs for today   Diagnostic Results:  MRI brain w/o: 1. New areas of cortical/subcortical edema within the right occipital lobe and at the right frontoparietal junction. No associated diffusion restriction. These may indicate areas of subacute infarction. A neoplastic or infectious/inflammatory process could also cause these findings. Further imaging with intravenous contrast administration may be helpful. 2. Chronic small vessel ischemia.

## 2019-02-12 NOTE — Care Plan (Signed)
Plan of Care  This is a 85 y.o. year old male who presented with frequent myoclonic jerks. MRI Brain w/wo contrast demonstrated findings concerning for glioblastoma in the right occipital and right parietal regions. He was loaded with Depacon 20 mg/kg IV x 1 in the ED and is now on Depacon 15 mg/kg/day divided TID. Myoclonic jerks have improved but still occurring. Discussed case with neurologist (Dr. Bruce Donath) over at [facility] and plan to transfer patient over to main campus to get continuous EEG to differentiate if these events are epileptic or not. Loaded with Dexamethasone 10 mg IV x 1 now. Maintenance with dexamethasone can be started once transferred. Please consult Neurology for cEEG once he arrives to [facility]. Also, consider Neurosurgery and Oncology consults.   Patient's son, Darrell Clark, have been updated of the current plan and patient's status.   Laurene Footman, MD Neurologist at [facility]

## 2019-02-12 NOTE — H&P (Signed)
History and Physical  Darrell Clark [id number] DOB: June 07, 1933 DOA: 02/11/2019  Referring physician: Transfer from [facility] PCP: Maryland Pink, MD  Outpatient Specialists:    Patient coming from: [facility]  Chief Complaint: New onset seizure worrisome for glioblastoma (brain tumor)  HPI:  Patient is an 85 year old African-American male with multiple past medical and cardiac history.  Patient carries diagnosis of dementia, stroke, peripheral vascular disease, prostate cancer, hypertension, hyperlipidemia, diabetes mellitus, coronary artery disease and congestive heart failure amongst other medical and cardiac history.  Patient presented to [facility] on 02/11/2019 with seizure.  Work-up done at [facility] was suggestive of likely glioblastoma.  Due to lack of availability of EEG at [facility], patient was transferred to [facility] for further assessment and management.  Patient has been seen by the neurology team that is directing care.  Sleepy, likely secondary to antiseizure medications and, therefore, could not give any significant history.    Pertinent labs: Labs done at [facility] earlier today revealed potassium of 5.9C of 6.5.  Imaging: independently reviewed.  MRI brain with contrast done earlier today at [facility] revealed "Moderate area of enhancement in the right occipital lobe and small areas of enhancement in the right frontal parietal white matter which is less extensive than the extent of T2 signal. A mass is favored, especially glioblastoma. Subacute right PCA infarct with watershed ischemia in the right cerebral white matter is a differential consideration, but would not expect the necrotic enhancement pattern at the level of the occipital lobe. Follow-up in 2-3 weeks may be helpful".  Review of Systems:   Unobtainable.  Past Medical History:  Diagnosis Date   Acquired cyst of kidney    Allergy    Anxiety    Arthritis    Atherosclerosis    BPH (benign prostatic hyperplasia)    CAD (coronary artery disease)    Cardiac arrhythmia    CHF (congestive heart failure) (HCC)    Chronic prostatitis    Degenerative arthritis    Dementia (HCC)    mild   Depression    Diabetes mellitus without complication (HCC)    Dyspnea    with exertion   Elevated prostate specific antigen (PSA)    Encysted hydrocele    GERD (gastroesophageal reflux disease)    Glaucoma (increased eye pressure)    Gout    Gross hematuria    Hemorrhoids    HLD (hyperlipidemia)    Hyperlipidemia, unspecified    Hypertension    Hypertrophy of breast    Impotence of organic origin    Incomplete bladder emptying    Malignant neoplasm of prostate (HCC)    Osteoarthritis    Other specified disorders of the male genital organs    Prostate cancer ([place])    PVD (peripheral vascular disease) ([place])    Sciatica    Spermatocele    Spinal stenosis of lumbar region with radiculopathy 11/11/2015   Stroke [facility])    Testicular hypofunction    other   Urinary frequency    Urinary obstruction    not elsewhere classified   Vertigo     Past Surgical History:  Procedure Laterality Date   APPENDECTOMY     CARDIAC CATHETERIZATION  11/2013   FIDUCIAL SEED PLACEMENT  10/14/2009   PROSTATE BIOPSY  08/11/2009   RADIATION FOR PROSTATE CANCER     TOTAL HIP ARTHROPLASTY Left 05/06/2017   Procedure: TOTAL HIP  ARTHROPLASTY;  Surgeon: Dereck Leep, MD;  Location: [facility] ORS;  Service: Orthopedics;  Laterality: Left;     reports that he quit smoking about 11 years ago. His smoking use included cigarettes. He has a 50.00 pack-year smoking history. He has never used smokeless tobacco. He reports that he does not drink alcohol or use drugs.  Allergies  Allergen Reactions   Niacin  Itching and Rash    Other reaction(s): Unknown Other reaction(s): UNKNOWN   Niacin And Related Rash    Family History  Problem Relation Age of Onset   Heart attack Mother    Hypertension Mother    Heart attack Father    Hypertension Father    Hypertension Sister    Cancer Brother    Hypertension Brother    Hypertension Brother    Hypertension Brother    Hypertension Sister    Kidney cancer Neg Hx    Prostate cancer Neg Hx    GU problems Neg Hx      Prior to Admission medications   Medication Sig Start Date End Date Taking? Authorizing Provider  acetaminophen (TYLENOL) 500 MG tablet Take 1,000 mg by mouth 2 (two) times daily.     [provider]  amLODipine (NORVASC) 5 MG tablet Take 5 mg by mouth daily.  02/21/13   [provider]  aspirin EC 81 MG EC tablet Take 1 tablet (81 mg total) by mouth daily. 04/16/16   Theodoro Grist, MD  benazepril (LOTENSIN) 20 MG tablet Take 20 mg by mouth daily.     [provider]  brimonidine (ALPHAGAN) 0.2 % ophthalmic solution Place 1 drop into both eyes daily. 07/10/18   [provider]  Brinzolamide-Brimonidine (SIMBRINZA) 1-0.2 % SUSP Apply 1 drop to eye daily.    [provider]  clonazepam (KLONOPIN) 0.25 MG disintegrating tablet Take 1 tablet (0.25 mg total) by mouth 2 (two) times daily. 02/15/2019   Dustin Flock, MD  donepezil (ARICEPT) 10 MG tablet Take 10 mg by mouth at bedtime.  02/21/13   [provider]  latanoprost (XALATAN) 0.005 % ophthalmic solution Place 1 drop into the right eye at bedtime. 12/17/16   Dustin Flock, MD  LORazepam (ATIVAN) 2 MG/ML injection Inject 0.5 mLs (1 mg total) into the vein every 4 (four) hours as needed for seizure. 01/23/2019   Dustin Flock, MD  valproate 518 mg in dextrose 5 % 50 mL Inject 518 mg into the vein every 8 (eight) hours. 01/31/2019   Dustin Flock, MD    Physical Exam: There were no vitals filed for this  visit.   Constitutional:   Sedated.   Eyes:   No pallor. No jaundice.  ENMT:   external ears, nose appear normal Neck:   Neck is supple. No JVD Respiratory:   CTA bilaterally, no w/r/r.   Respiratory effort normal. No retractions or accessory muscle use Cardiovascular:   S1S2  No LE extremity edema   Abdomen:   Abdomen is soft and non tender. Organs are difficult to assess. Neurologic:  Sedated.  Wt Readings from Last 3 Encounters:  02/11/19 103.6 kg  07/12/18 102.1 kg  03/12/18 102.5 kg    I have personally reviewed following labs and imaging studies  Labs on Admission:  CBC: Recent Labs  Lab 02/11/19 1252 02/11/2019 0540  WBC 11.3* 12.2*  HGB 15.5 15.5  HCT 47.3 47.8  MCV 94.6 95.2  PLT 168 [id number]   Basic Metabolic Panel: Recent Labs  Lab 02/11/19 1252 01/18/2019  0540  NA 142 140  K 4.1 5.9*  CL 103 101  CO2 24 26  GLUCOSE 189* 151*  BUN 14 17  CREATININE 1.15 1.07  CALCIUM 9.1 9.7   Liver Function Tests: Recent Labs  Lab 02/11/19 1252  AST 28  ALT 16  ALKPHOS 76  BILITOT 0.7  PROT 7.3  ALBUMIN 4.0   No results for input(s): LIPASE, AMYLASE in the last 168 hours. No results for input(s): AMMONIA in the last 168 hours. Coagulation Profile: No results for input(s): INR, PROTIME in the last 168 hours. Cardiac Enzymes: No results for input(s): CKTOTAL, CKMB, CKMBINDEX, TROPONINI in the last 168 hours. BNP (last 3 results) No results for input(s): PROBNP in the last 8760 hours. HbA1C: Recent Labs    02/11/19 1834 01/18/2019 0540  HGBA1C 6.4* 6.5*   CBG: Recent Labs  Lab 02/11/19 2229 02/08/2019 0746 01/22/2019 1153 02/02/2019 1625  GLUCAP 90 133* 116* 229*   Lipid Profile: Recent Labs    01/18/2019 0540  CHOL 134  HDL 50  LDLCALC 69  TRIG 77  CHOLHDL 2.7   Thyroid Function Tests: No results for input(s): TSH, T4TOTAL, FREET4, T3FREE, THYROIDAB in the last 72 hours. Anemia Panel: No results for input(s): VITAMINB12, FOLATE,  FERRITIN, TIBC, IRON, RETICCTPCT in the last 72 hours. Urine analysis:    Component Value Date/Time   COLORURINE YELLOW (A) 02/11/2019 1340   APPEARANCEUR CLEAR (A) 02/11/2019 1340   APPEARANCEUR Clear 01/19/2013 1937   LABSPEC 1.019 02/11/2019 1340   LABSPEC 1.017 01/19/2013 1937   PHURINE 5.0 02/11/2019 1340   GLUCOSEU NEGATIVE 02/11/2019 1340   GLUCOSEU Negative 01/19/2013 1937   HGBUR NEGATIVE 02/11/2019 1340   BILIRUBINUR NEGATIVE 02/11/2019 1340   BILIRUBINUR Negative 01/19/2013 1937   KETONESUR 5 (A) 02/11/2019 1340   PROTEINUR NEGATIVE 02/11/2019 1340   NITRITE NEGATIVE 02/11/2019 1340   LEUKOCYTESUR NEGATIVE 02/11/2019 1340   LEUKOCYTESUR Negative 01/19/2013 1937   Sepsis Labs: @LABRCNTIP (procalcitonin:4,lacticidven:4) ) Recent Results (from the past 240 hour(s))  Novel Coronavirus, NAA (Hosp order, Send-out to Ref Lab; TAT 18-24 hrs     Status: None   Collection Time: 02/11/19  1:40 PM   Specimen: Nasopharyngeal Swab; Respiratory  Result Value Ref Range Status   SARS-CoV-2, NAA NOT DETECTED NOT DETECTED Final    Comment: (NOTE) This test was developed and its performance characteristics determined by [employer]. This test has not been FDA cleared or approved. This test has been authorized by FDA under an Emergency Use Authorization (EUA). This test is only authorized for the duration of time the declaration that circumstances exist justifying the authorization of the emergency use of in vitro diagnostic tests for detection of SARS-CoV-2 virus and/or diagnosis of COVID-19 infection under section 564(b)(1) of the Act, 21 U.S.C. KA:123727), unless the authorization is terminated or revoked sooner. When diagnostic testing is negative, the possibility of a false negative result should be considered in the context of a patient's recent exposures and the presence of clinical signs and symptoms consistent with COVID-19. An individual without symptoms  of COVID-19 and who is not shedding SARS-CoV-2 virus would expect to have a negative (not detected) result in this assay. Performed  At: [facility] [address] [place], [place] [id number] Rush Farmer MD [id number]    [place]  Final    Comment: Performed at [facility], [address] [place], [place] [address]      Radiological Exams on Admission: [place]  Wo Contrast  Result Date: 02/11/2019 CLINICAL DATA:  Seizure-like activity. EXAM: CT ANGIOGRAPHY HEAD TECHNIQUE: Multidetector CT imaging of the head was performed using the standard protocol during bolus administration of intravenous contrast. Multiplanar CT image reconstructions and MIPs were obtained to evaluate the vascular anatomy. CONTRAST:  41mL OMNIPAQUE IOHEXOL 350 MG/ML SOLN COMPARISON:  04/15/2016 FINDINGS: Anterior circulation: The internal carotid arteries are patent from skull base to carotid termini. Calcified plaque results in mild-to-moderate proximal supraclinoid stenosis bilaterally with detailed assessment limited by motion artifact. The MCAs are patent without evidence of proximal branch occlusion or significant M1 stenosis. Focal calcified plaque or a chronic embolus in the proximal left M2 inferior division is unchanged with associated moderate stenosis. Both A1 segments are widely patent. There is unchanged chronic occlusion of the left A2 segment. No aneurysm is identified. Posterior circulation: The visualized distal vertebral arteries are patent to the basilar with the left being dominant. There are severe multifocal right and distal left V4 stenoses which are similar to the prior CTA. The basilar artery is patent with moderate to severe multifocal stenosis. Patent SCA is are seen bilaterally. They are fetal origins of both PCAs with moderate branch vessel irregularity but no evidence of flow limiting proximal stenosis. No aneurysm is identified. Venous  sinuses: Poorly evaluated due to arterial phase contrast timing. Anatomic variants: Fetal PCAs. IMPRESSION: 1. No major intracranial arterial occlusion. 2. 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 3. Chronic left A2 occlusion. Electronically Signed   By: Logan Bores M.D.   On: 02/11/2019 18:26   Ct Head Wo Contrast  Result Date: 02/11/2019 CLINICAL DATA:  Seizures. EXAM: CT HEAD WITHOUT CONTRAST TECHNIQUE: Contiguous axial images were obtained from the base of the skull through the vertex without intravenous contrast. COMPARISON:  CT scan of June 22, 2018. FINDINGS: Brain: Ventricular size is within normal limits. No midline shift is noted. Interval development of several right-sided subcortical white matter low densities are noted concerning for infarction of indeterminate age or edema due to other pathology. No hemorrhage is noted. Vascular: No hyperdense vessel or unexpected calcification. Skull: Normal. Negative for fracture or focal lesion. Sinuses/Orbits: No acute finding. Other: None. IMPRESSION: Interval development of several ill-defined right sided subcortical white matter low densities concerning for infarction of indeterminate age or edema due to other pathology. MRI is recommended for further evaluation. Electronically Signed   By: Marijo Conception M.D.   On: 02/11/2019 13:31   Mr Brain Wo Contrast  Result Date: 02/11/2019 CLINICAL DATA:  Recent fall.  Tremor. EXAM: MRI HEAD WITHOUT CONTRAST TECHNIQUE: Multiplanar, multiecho pulse sequences of the brain and surrounding structures were obtained without intravenous contrast. COMPARISON:  Head CT 02/11/2019 Brain MRI 07/12/2018 FINDINGS: BRAIN: There is no acute infarct, acute hemorrhage or extra-axial collection. There is a new area hyperintense T2-weighted signal within the right occipital cortex and within the subcortical white matter at the base of the pre  and postcentral gyri. The cerebral and cerebellar volume are age-appropriate. There is no hydrocephalus. VASCULAR: The major intracranial arterial and venous sinus flow voids are normal. Susceptibility-sensitive sequences show no chronic microhemorrhage or superficial siderosis. SKULL AND UPPER CERVICAL SPINE: Calvarial bone marrow signal is normal. There is no skull base mass. The visualized upper cervical spine and soft tissues are normal. SINUSES/ORBITS: There are no fluid levels or advanced mucosal thickening. The mastoid air cells and middle ear cavities are free of fluid. The orbits are  normal. IMPRESSION: 1. New areas of cortical/subcortical edema within the right occipital lobe and at the right frontoparietal junction. No associated diffusion restriction. These may indicate areas of subacute infarction. A neoplastic or infectious/inflammatory process could also cause these findings. Further imaging with intravenous contrast administration may be helpful. 2. Chronic small vessel ischemia. Electronically Signed   By: Ulyses Jarred M.D.   On: 02/11/2019 22:53   Mr Brain W Contrast  Result Date: 02/09/2019 CLINICAL DATA:  Subacute neuro deficit EXAM: MRI HEAD WITH CONTRAST TECHNIQUE: Multiplanar, multiecho pulse sequences of the brain and surrounding structures were obtained with intravenous contrast. CONTRAST:  10 cc Gadavist intravenous COMPARISON:  Brain MRI from yesterday and CT CTA from the day before. Brain MRI 07/12/2018 FINDINGS: Moderate area of contiguous enhancement in the right occipital lobe extending from the occipital horn of the ventricle to the cortex. There is central non enhancement at this level. There are aligned patchy areas of white matter enhancement in the right posterior frontal and parietal region. Enhancement is much less extensive than the extent of T2 and FLAIR signal. No other areas of enhancement. IMPRESSION: Moderate area of enhancement in the right occipital lobe and small  areas of enhancement in the right frontal parietal white matter which is less extensive than the extent of T2 signal. A mass is favored, especially glioblastoma. Subacute right PCA infarct with watershed ischemia in the right cerebral white matter is a differential consideration, but would not expect the necrotic enhancement pattern at the level of the occipital lobe. Follow-up in 2-3 weeks may be helpful. Electronically Signed   By: Monte Fantasia M.D.   On: 01/23/2019 11:38    Active Problems:   Seizure [facility])   Assessment/Plan Seizure likely secondary to possible glioblastoma: Admit patient as planned. Neurology is directing care. Patient is already undergoing EEG. Continue Keppra, Depakote and benzodiazepines. Continue IV dexamethasone for vasogenic edema. For possible brain tumor work-up (neurosurgery and/oncology (depending on goal of care  Dementia: Does not seem to have any behavioral problems. Goal of care with determining further work-up  Hypertension: Controlled. Further management depend on hospital course.  Diabetes mellitus: Sliding scale insulin coverage. Continue to optimize.  DVT prophylaxis: SCD Code Status: Full code Family Communication:  Disposition Plan: This will depend on hospital course the goal of care Consults called: Neurology has already seen patient Admission status: Inpatient  Time spent: 65 minutes  Dana Allan, MD  [facility] Pager #: [phone] 7PM-7AM contact night coverage as above  01/24/2019, 11:12 PM

## 2019-02-12 NOTE — Discharge Summary (Signed)
[facility] - [place] at [facility], 85 y.o., DOB [birth year], MRN [id number]. 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  CT scan of the head demonstrated multiple hypodensities concerning for ischemia versus possible mass.  She was started on valproic acid IV by neurology.  Patient also underwent MRI there was a concern for possible mass.  Underwent MRI with contrast which showed following. Moderate area of enhancement in the right occipital lobe and small areas of enhancement in the right frontal parietal white matter which is less extensive than the extent of T2 signal. A mass is favored, especially glioblastoma   Patient's myoclonic jerks have improved however neurology with the finding of glioblastoma feels patient needs neurosurgery evaluation as well as possible continuous EEG.  There is limited access to EEG here therefore he is being transferred to [facility].  Patient has been accepted by Dr. Talbot Grumbling.         Consults  neurology  Significant Tests:  See full reports for all details    Ct Angio Head W Or Wo Contrast  Result Date: 02/11/2019 CLINICAL DATA:  Seizure-like activity. EXAM: CT ANGIOGRAPHY HEAD TECHNIQUE: Multidetector CT imaging of the head was performed using the standard protocol during bolus administration of intravenous contrast. Multiplanar CT image reconstructions and MIPs were obtained to  evaluate the vascular anatomy. CONTRAST:  32mL OMNIPAQUE IOHEXOL 350 MG/ML SOLN COMPARISON:  04/15/2016 FINDINGS: Anterior circulation: The internal carotid arteries are patent from skull base to carotid termini. Calcified plaque results in mild-to-moderate proximal supraclinoid stenosis bilaterally with detailed assessment limited by motion artifact. The MCAs are patent without evidence of proximal branch occlusion or significant M1 stenosis. Focal calcified plaque or a chronic embolus in the proximal left M2 inferior division is unchanged with associated moderate stenosis. Both A1 segments are widely patent. There is unchanged chronic occlusion of the left A2 segment. No aneurysm is identified. Posterior circulation: The visualized distal vertebral arteries are patent to the basilar with the left being dominant. There are severe multifocal right and distal left V4 stenoses which are similar to the prior CTA. The basilar artery is patent with moderate to severe multifocal stenosis. Patent SCA is are seen bilaterally. They are fetal origins of both PCAs with moderate branch vessel irregularity but no evidence of flow limiting proximal stenosis. No aneurysm is identified. Venous sinuses: Poorly evaluated due to arterial phase contrast timing. Anatomic variants: Fetal PCAs. IMPRESSION: 1. No major intracranial arterial occlusion. 2. 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 3. Chronic left A2 occlusion. Electronically Signed   By: Logan Bores M.D.   On: 02/11/2019 18:26   Ct Head Wo Contrast  Result Date: 02/11/2019 CLINICAL DATA:  Seizures. EXAM: CT HEAD WITHOUT CONTRAST TECHNIQUE: Contiguous axial images were obtained from the base of the skull through the vertex without intravenous contrast. COMPARISON:  CT scan of June 22, 2018. FINDINGS: Brain: Ventricular size is within normal limits. No midline shift is noted. Interval  development of several right-sided  subcortical white matter low densities are noted concerning for infarction of indeterminate age or edema due to other pathology. No hemorrhage is noted. Vascular: No hyperdense vessel or unexpected calcification. Skull: Normal. Negative for fracture or focal lesion. Sinuses/Orbits: No acute finding. Other: None. IMPRESSION: Interval development of several ill-defined right sided subcortical white matter low densities concerning for infarction of indeterminate age or edema due to other pathology. MRI is recommended for further evaluation. Electronically Signed   By: Marijo Conception M.D.   On: 02/11/2019 13:31   Mr Brain Wo Contrast  Result Date: 02/11/2019 CLINICAL DATA:  Recent fall.  Tremor. EXAM: MRI HEAD WITHOUT CONTRAST TECHNIQUE: Multiplanar, multiecho pulse sequences of the brain and surrounding structures were obtained without intravenous contrast. COMPARISON:  Head CT 02/11/2019 Brain MRI 07/12/2018 FINDINGS: BRAIN: There is no acute infarct, acute hemorrhage or extra-axial collection. There is a new area hyperintense T2-weighted signal within the right occipital cortex and within the subcortical white matter at the base of the pre and postcentral gyri. The cerebral and cerebellar volume are age-appropriate. There is no hydrocephalus. VASCULAR: The major intracranial arterial and venous sinus flow voids are normal. Susceptibility-sensitive sequences show no chronic microhemorrhage or superficial siderosis. SKULL AND UPPER CERVICAL SPINE: Calvarial bone marrow signal is normal. There is no skull base mass. The visualized upper cervical spine and soft tissues are normal. SINUSES/ORBITS: There are no fluid levels or advanced mucosal thickening. The mastoid air cells and middle ear cavities are free of fluid. The orbits are normal. IMPRESSION: 1. New areas of cortical/subcortical edema within the right occipital lobe and at the right frontoparietal junction. No associated  diffusion restriction. These may indicate areas of subacute infarction. A neoplastic or infectious/inflammatory process could also cause these findings. Further imaging with intravenous contrast administration may be helpful. 2. Chronic small vessel ischemia. Electronically Signed   By: Ulyses Jarred M.D.   On: 02/11/2019 22:53   Mr Brain W Contrast  Result Date: 01/29/2019 CLINICAL DATA:  Subacute neuro deficit EXAM: MRI HEAD WITH CONTRAST TECHNIQUE: Multiplanar, multiecho pulse sequences of the brain and surrounding structures were obtained with intravenous contrast. CONTRAST:  10 cc Gadavist intravenous COMPARISON:  Brain MRI from yesterday and CT CTA from the day before. Brain MRI 07/12/2018 FINDINGS: Moderate area of contiguous enhancement in the right occipital lobe extending from the occipital horn of the ventricle to the cortex. There is central non enhancement at this level. There are aligned patchy areas of white matter enhancement in the right posterior frontal and parietal region. Enhancement is much less extensive than the extent of T2 and FLAIR signal. No other areas of enhancement. IMPRESSION: Moderate area of enhancement in the right occipital lobe and small areas of enhancement in the right frontal parietal white matter which is less extensive than the extent of T2 signal. A mass is favored, especially glioblastoma. Subacute right PCA infarct with watershed ischemia in the right cerebral white matter is a differential consideration, but would not expect the necrotic enhancement pattern at the level of the occipital lobe. Follow-up in 2-3 weeks may be helpful. Electronically Signed   By: Monte Fantasia M.D.   On: 02/07/2019 11:38       Today   Subjective:   Darrell Clark patient this morning is not able to communicate he is awake.  Objective:   Blood pressure (!) 133/59, pulse 78, temperature 97.7 F (36.5 C), temperature source Axillary, resp. rate 20, height 5\' 11"  (1.803 m),  weight 103.6 kg, SpO2  98 %.  .  Intake/Output Summary (Last 24 hours) at 01/18/2019 1351 Last data filed at 02/11/2019 1730 Gross per 24 hour  Intake 56.72 ml  Output -  Net 56.72 ml    Exam VITAL SIGNS: Blood pressure (!) 133/59, pulse 78, temperature 97.7 F (36.5 C), temperature source Axillary, resp. rate 20, height 5\' 11"  (1.803 m), weight 103.6 kg, SpO2 98 %.  GENERAL:  85 y.o.-year-old patient lying in the bed with no acute distress.  EYES: Pupils equal, round, reactive to light and accommodation. No scleral icterus. Extraocular muscles intact.  HEENT: Head atraumatic, normocephalic. Oropharynx and nasopharynx clear.  NECK:  Supple, no jugular venous distention. No thyroid enlargement, no tenderness.  LUNGS: Normal breath sounds bilaterally, no wheezing, rales,rhonchi or crepitation. No use of accessory muscles of respiration.  CARDIOVASCULAR: S1, S2 normal. No murmurs, rubs, or gallops.  ABDOMEN: Soft, nontender, nondistended. Bowel sounds present. No organomegaly or mass.  EXTREMITIES: No pedal edema, cyanosis, or clubbing.  NEUROLOGIC: Limited due to patient not able to communicate PSYCHIATRIC: The patient is alert but not oriented SKIN: No obvious rash, lesion, or ulcer.   Data Review     CBC w Diff:  Lab Results  Component Value Date   WBC 12.2 (H) 02/02/2019   HGB 15.5 01/31/2019   HGB 16.2 05/28/2013   HCT 47.8 01/21/2019   HCT 48.7 05/28/2013   PLT 185 01/24/2019   PLT 174 05/28/2013   LYMPHOPCT 32 07/12/2018   LYMPHOPCT 28.5 05/28/2013   MONOPCT 11 07/12/2018   MONOPCT 11.6 05/28/2013   EOSPCT 3 07/12/2018   EOSPCT 4.0 05/28/2013   BASOPCT 1 07/12/2018   BASOPCT 0.6 05/28/2013   CMP:  Lab Results  Component Value Date   NA 140 02/05/2019   NA 141 05/28/2013   K 5.9 (H) 02/11/2019   K 3.8 05/28/2013   CL 101 01/26/2019   CL 108 (H) 05/28/2013   CO2 26 01/18/2019   CO2 28 05/28/2013   BUN 17 02/10/2019   BUN 12 05/28/2013   CREATININE 1.07  01/24/2019   CREATININE 0.93 05/28/2013   PROT 7.3 02/11/2019   PROT 7.5 05/28/2013   ALBUMIN 4.0 02/11/2019   ALBUMIN 3.7 05/28/2013   BILITOT 0.7 02/11/2019   BILITOT 0.8 05/28/2013   ALKPHOS 76 02/11/2019   ALKPHOS 102 05/28/2013   AST 28 02/11/2019   AST 26 05/28/2013   ALT 16 02/11/2019   ALT 25 05/28/2013  .  Micro Results No results found for this or any previous visit (from the past 240 hour(s)).      Code Status Orders  (From admission, onward)         Start     Ordered   02/11/19 1836  Full code  Continuous     02/11/19 1835        Code Status History    Date Active Date Inactive Code Status Order ID Comments User Context   05/06/2017 1309 05/08/2017 2024 Full Code [id number]  Dereck Leep, MD Inpatient   12/13/2016 1712 12/17/2016 1948 Full Code [id number]  Vaughan Basta, MD Inpatient   04/15/2016 0410 04/16/2016 1729 Full Code [id number]  Lance Coon, MD Inpatient   Advance Care Planning Activity    Advance Directive Documentation     Most Recent Value  Type of Advance Directive  Healthcare Power of Attorney  Pre-existing out of facility DNR order (yellow form or pink MOST form)  -  "MOST" Form in Place?  -  Discharge Medications   Allergies as of 01/31/2019      Reactions   Niacin Itching, Rash   Other reaction(s): Unknown Other reaction(s): UNKNOWN   Niacin And Related Rash      Medication List    STOP taking these medications   clopidogrel 75 MG tablet Commonly known as: PLAVIX   escitalopram 10 MG tablet Commonly known as: LEXAPRO   glipiZIDE 2.5 MG 24 hr tablet Commonly known as: GLUCOTROL XL   indomethacin 25 MG capsule Commonly known as: INDOCIN   LORazepam 0.5 MG tablet Commonly known as: ATIVAN Replaced by: LORazepam 2 MG/ML injection   meclizine 12.5 MG tablet Commonly known as: ANTIVERT   montelukast 10 MG tablet Commonly known as: SINGULAIR   nitroGLYCERIN 0.4 MG SL tablet Commonly known  as: NITROSTAT   ONE TOUCH ULTRA TEST test strip Generic drug: glucose blood   OneTouch Delica Lancets 99991111 Misc   Osteo Bi-Flex Triple Strength Tabs   pantoprazole 40 MG tablet Commonly known as: PROTONIX   simvastatin 20 MG tablet Commonly known as: ZOCOR   Systane 0.4-0.3 % Soln Generic drug: Polyethyl Glycol-Propyl Glycol   triamcinolone 55 MCG/ACT Aero nasal inhaler Commonly known as: NASACORT     TAKE these medications   acetaminophen 500 MG tablet Commonly known as: TYLENOL Take 1,000 mg by mouth 2 (two) times daily.   amLODipine 5 MG tablet Commonly known as: NORVASC Take 5 mg by mouth daily.   aspirin 81 MG EC tablet Take 1 tablet (81 mg total) by mouth daily.   benazepril 20 MG tablet Commonly known as: LOTENSIN Take 20 mg by mouth daily.   brimonidine 0.2 % ophthalmic solution Commonly known as: ALPHAGAN Place 1 drop into both eyes daily.   clonazePAM 0.25 MG disintegrating tablet Commonly known as: KLONOPIN Take 1 tablet (0.25 mg total) by mouth 2 (two) times daily.   donepezil 10 MG tablet Commonly known as: ARICEPT Take 10 mg by mouth at bedtime.   latanoprost 0.005 % ophthalmic solution Commonly known as: XALATAN Place 1 drop into the right eye at bedtime.   LORazepam 2 MG/ML injection Commonly known as: ATIVAN Inject 0.5 mLs (1 mg total) into the vein every 4 (four) hours as needed for seizure. Replaces: LORazepam 0.5 MG tablet   Simbrinza 1-0.2 % Susp Generic drug: Brinzolamide-Brimonidine Apply 1 drop to eye daily.   valproate 518 mg in dextrose 5 % 50 mL Inject 518 mg into the vein every 8 (eight) hours.          Total Time in preparing paper work, data evaluation and todays exam - 65 minutes  Dustin Flock M.D on 01/18/2019 at [place]  [phone]

## 2019-02-12 NOTE — Progress Notes (Signed)
OT Cancellation Note  Patient Details Name: Darrell Clark MRN: [id number] DOB: Apr 23, 1933   Cancelled Treatment:    Reason Eval/Treat Not Completed: Medical issues which prohibited therapy. Thank you for the OT consult. Order received and chart reviewed. Pt noted to have most recent potassium value of 5.9 which is above the parameters recommended for therapy. Will continue to follow acutely and re-attempt at a later date/time as available and pt medically appropriate for OT evaluation.   Shara Blazing, M.S., OTR/L Ascom: [phone] 02/05/2019, 8:57 AM

## 2019-02-13 DIAGNOSIS — R569 Unspecified convulsions: Secondary | ICD-10-CM

## 2019-02-13 DIAGNOSIS — E0865 Diabetes mellitus due to underlying condition with hyperglycemia: Secondary | ICD-10-CM

## 2019-02-13 DIAGNOSIS — G40901 Epilepsy, unspecified, not intractable, with status epilepticus: Secondary | ICD-10-CM

## 2019-02-13 LAB — GLUCOSE, CAPILLARY
Glucose-Capillary: 163 mg/dL — ABNORMAL HIGH (ref 70–99)
Glucose-Capillary: 205 mg/dL — ABNORMAL HIGH (ref 70–99)
Glucose-Capillary: 154 mg/dL — ABNORMAL HIGH (ref 70–99)
Glucose-Capillary: 161 mg/dL — ABNORMAL HIGH (ref 70–99)

## 2019-02-13 LAB — RENAL FUNCTION PANEL
Albumin: 3.6 g/dL (ref 3.5–5.0)
Anion gap: 14 (ref 5–15)
BUN: 20 mg/dL (ref 8–23)
CO2: 22 mmol/L (ref 22–32)
Calcium: 9.7 mg/dL (ref 8.9–10.3)
Chloride: 100 mmol/L (ref 98–111)
Creatinine, Ser: 1.08 mg/dL (ref 0.61–1.24)
GFR calc Af Amer: >60 mL/min (ref >60)
GFR calc non Af Amer: >60 mL/min (ref >60)
Glucose, Bld: 173 mg/dL — ABNORMAL HIGH (ref 70–99)
Phosphorus: 3.8 mg/dL (ref 2.5–4.6)
Potassium: 4.5 mmol/L (ref 3.5–5.1)
Sodium: 136 mmol/L (ref 135–145)

## 2019-02-13 LAB — CBC WITH DIFFERENTIAL/PLATELET
Abs Immature Granulocytes: 0.03 10*3/uL (ref 0.00–0.07)
Basophils Absolute: 0 10*3/uL (ref 0.0–0.1)
Basophils Relative: 0 %
Eosinophils Absolute: 0 10*3/uL (ref 0.0–0.5)
Eosinophils Relative: 0 %
HCT: 48.7 % (ref 39.0–52.0)
Hemoglobin: 16.3 g/dL (ref 13.0–17.0)
Immature Granulocytes: 0 %
Lymphocytes Relative: 9 %
Lymphs Abs: 0.8 10*3/uL (ref 0.7–4.0)
MCH: 31.2 pg (ref 26.0–34.0)
MCHC: 33.5 g/dL (ref 30.0–36.0)
MCV: 93.1 fL (ref 80.0–100.0)
Monocytes Absolute: 0.2 10*3/uL (ref 0.1–1.0)
Monocytes Relative: 2 %
Neutro Abs: 7.7 10*3/uL (ref 1.7–7.7)
Neutrophils Relative %: 89 %
Platelets: 194 10*3/uL (ref 150–400)
RBC: 5.23 MIL/uL (ref 4.22–5.81)
RDW: 14 % (ref 11.5–15.5)
WBC: 8.7 10*3/uL (ref 4.0–10.5)
nRBC: 0 % (ref 0.0–0.2)

## 2019-02-13 LAB — COMPREHENSIVE METABOLIC PANEL
ALT: 26 U/L (ref 0–44)
AST: 64 U/L — ABNORMAL HIGH (ref 15–41)
Albumin: 3.7 g/dL (ref 3.5–5.0)
Alkaline Phosphatase: 71 U/L (ref 38–126)
Anion gap: 14 (ref 5–15)
BUN: 21 mg/dL (ref 8–23)
CO2: 21 mmol/L — ABNORMAL LOW (ref 22–32)
Calcium: 9.8 mg/dL (ref 8.9–10.3)
Chloride: 101 mmol/L (ref 98–111)
Creatinine, Ser: 1.06 mg/dL (ref 0.61–1.24)
GFR calc Af Amer: >60 mL/min (ref >60)
GFR calc non Af Amer: >60 mL/min (ref >60)
Glucose, Bld: 177 mg/dL — ABNORMAL HIGH (ref 70–99)
Potassium: 4.4 mmol/L (ref 3.5–5.1)
Sodium: 136 mmol/L (ref 135–145)
Total Bilirubin: 1.2 mg/dL (ref 0.3–1.2)
Total Protein: 7.3 g/dL (ref 6.5–8.1)

## 2019-02-13 LAB — MAGNESIUM: Magnesium: 2 mg/dL (ref 1.7–2.4)

## 2019-02-13 LAB — VALPROIC ACID LEVEL: Valproic Acid Lvl: 67 ug/mL (ref 50.0–100.0)

## 2019-02-13 LAB — LACTIC ACID, PLASMA: Lactic Acid, Venous: 1.7 mmol/L (ref 0.5–1.9)

## 2019-02-13 MED ORDER — LEVETIRACETAM IN NACL 500 MG/100ML IV SOLN
500.0000 mg | Freq: Once | INTRAVENOUS | Status: AC
  Administered 2019-02-13: 500 mg via INTRAVENOUS
  Filled 2019-02-13: qty 100

## 2019-02-13 MED ORDER — LEVETIRACETAM IN NACL 1500 MG/100ML IV SOLN
1500.0000 mg | Freq: Two times a day (BID) | INTRAVENOUS | Status: DC
  Administered 2019-02-13 – 2019-02-19 (×12): 1500 mg via INTRAVENOUS
  Filled 2019-02-13 (×14): qty 100

## 2019-02-13 MED ORDER — CLONAZEPAM 0.25 MG PO TBDP: 0.5000 mg | ORAL_TABLET | Freq: Three times a day (TID) | ORAL | Status: DC

## 2019-02-13 MED ORDER — VALPROATE SODIUM 500 MG/5ML IV SOLN
500.0000 mg | Freq: Two times a day (BID) | INTRAVENOUS | Status: DC
  Filled 2019-02-13 (×2): qty 5

## 2019-02-13 MED ORDER — VALPROATE SODIUM 500 MG/5ML IV SOLN
750.0000 mg | Freq: Every day | INTRAVENOUS | Status: DC
  Administered 2019-02-13: 750 mg via INTRAVENOUS
  Filled 2019-02-13 (×2): qty 7.5

## 2019-02-13 MED ORDER — CLONAZEPAM 1 MG PO TABS
1.0000 mg | ORAL_TABLET | Freq: Once | ORAL | Status: AC
  Administered 2019-02-13: 1 mg via ORAL
  Filled 2019-02-13: qty 1

## 2019-02-13 MED ORDER — LEVETIRACETAM IN NACL 1500 MG/100ML IV SOLN
1500.0000 mg | Freq: Two times a day (BID) | INTRAVENOUS | Status: DC
  Filled 2019-02-13: qty 100

## 2019-02-13 MED ORDER — LABETALOL HCL 5 MG/ML IV SOLN: 10.0000 mg | INTRAVENOUS | Status: DC | PRN

## 2019-02-13 MED ORDER — SODIUM CHLORIDE 0.9 % IV SOLN: 2000.0000 mg | Freq: Two times a day (BID) | INTRAVENOUS | Status: DC

## 2019-02-13 MED ORDER — LEVETIRACETAM IN NACL 1000 MG/100ML IV SOLN
1000.0000 mg | Freq: Once | INTRAVENOUS | Status: AC
  Administered 2019-02-13: 1000 mg via INTRAVENOUS
  Filled 2019-02-13: qty 100

## 2019-02-13 MED ORDER — CLONAZEPAM 0.25 MG PO TBDP
1.0000 mg | ORAL_TABLET | Freq: Three times a day (TID) | ORAL | Status: DC
  Administered 2019-02-13 – 2019-02-14 (×3): 1 mg via ORAL
  Filled 2019-02-13 (×7): qty 4

## 2019-02-13 MED ORDER — LEVETIRACETAM IN NACL 1000 MG/100ML IV SOLN
1000.0000 mg | Freq: Two times a day (BID) | INTRAVENOUS | Status: DC
  Filled 2019-02-13: qty 100

## 2019-02-13 NOTE — Progress Notes (Addendum)
Reason for consult: Seizure  Subjective: Patient was loaded with Keppra overnight.  Per his son at bedside, his left arm jerking has slightly improved.  Son states he is aware about possibility of brain tumor.  He also states that he would like for the patient to be intubated if his mental status deteriorates secondary to seizure/antiseizure medications and he required intubation.  However, he had to go over similar things recently with his mom and his dad had expressed that if he suffers irreversible damage to him he would not like life-prolonging measures.   ROS: negative except above  Examination  Vital signs in last 24 hours: Temp:  [97.9 F (36.6 C)-99.5 F (37.5 C)] 98.5 F (36.9 C) (08/28 1244) Pulse Rate:  [79-101] 83 (08/28 1244) Resp:  [16-24] 24 (08/28 1244) BP: (119-170)/(71-113) 119/71 (08/28 1244) SpO2:  [94 %-97 %] 97 % (08/28 1244)  General: lying in bed, not in apparent distress CVS: pulse-normal rate and rhythm RS: breathing comfortably Extremities: normal, left side jerking  Neuro: MS: Alert, oriented, follows commands CN: pupils equal and reactive,  EOMI, face symmetric, tongue midline, normal sensation over face Motor: 4/5 in right upper and lower extremity, 3/5 in left upper and lower extremity, rhythmic  jerking in left upper extremity as well as left foot   Reflexes: Hoffman's absent, plantar downgoing  Coordination: FTN intact in Right upper extremity, unable to perform on left upper extremity due to focal seizure. Gait: not tested  Basic Metabolic Panel: Recent Labs  Lab 02/11/19 1252 01/30/2019 0540 02/13/19 0101 02/13/19 0656  NA 142 140 136 136  K 4.1 5.9* 4.5 4.4  CL 103 101 100 101  CO2 24 26 22  21*  GLUCOSE 189* 151* 173* 177*  BUN 14 17 20 21   CREATININE 1.15 1.07 1.08 1.06  CALCIUM 9.1 9.7 9.7 9.8  MG  --   --  2.0  --   PHOS  --   --  3.8  --     CBC: Recent Labs  Lab 02/11/19 1252 02/01/2019 0540 02/13/19 0101  WBC 11.3* 12.2*  8.7  NEUTROABS  --   --  7.7  HGB 15.5 15.5 16.3  HCT 47.3 47.8 48.7  MCV 94.6 95.2 93.1  PLT 168 185 194     Coagulation Studies: No results for input(s): LABPROT, INR in the last 72 hours.  Imaging MRI brain with and without contrast: Moderate area of enhancement in the right occipital lobe and small areas of enhancement in the right frontal parietal white matter which is less extensive than the extent of T2 signal. A mass is favored, especially glioblastoma. Subacute right PCA infarct with watershed ischemia in the right cerebral white matter is a ifferential consideration, but would not expect the necrotic enhancement pattern at the level of the occipital lobe   ASSESSMENT AND PLAN 85 year old man with above past medical history presented for seizure-like activity.  Continues to have left-sided jerking movements and has weakness and neglect of the left hemibody. MRI concerning for GBM in the right occipital lobe plus/minus stroke in that area as well. 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  Goal is to stop focal status epilepticus and avoid sedation to the point of requiring intubation.  -Continue seizure precautions -PRN IV Ativan 2 mg for generalized tonic-clonic seizure lasting more than 2 minutes   CRITICAL CARE Performed by: Lora Havens   Total critical care time: 45 minutes  Critical care time was exclusive of separately billable procedures and treating other patients.  Critical care was necessary to treat or prevent imminent or  life-threatening deterioration.  Critical care was time spent personally by me on the following activities: development of treatment plan with patient and/or surrogate as well as nursing, discussions with consultants, evaluation of patient's response to treatment, examination of patient, obtaining history from patient or surrogate, ordering and performing treatments and interventions, ordering and review of laboratory studies, ordering and review of radiographic studies, pulse oximetry and re-evaluation of patient's condition.   I extensively discussed with patient's son at bedside regarding patient being fully status epilepticus, requiring additional antiseizure medications.  I will take pain to him that if he continues to have left-sided focal seizures, it is possible that he may eventually have generalized tonic-clonic seizures or his mentation may start worsening.  At the same time, antiseizure medications also have sedative effect.  I explained to him we that we are trying to gradually increase his antiseizure medications to stop his seizures without having to intubate him.  We briefly discussed about a possible brain tumor as the cause for the seizures.  Patient's son expressed understanding.

## 2019-02-13 NOTE — Procedures (Signed)
Patient Name: Darrell Clark  MRN: [id number]  Epilepsy Attending: Lora Havens  Referring Physician/Provider: Dr Dana Allan Date: 01/30/2019 Duration: 29.12 mins  Patient history: 85yo M with seizure like activity. EEG to evaluate for seizure.   Level of alertness: drowsy  AEDs during EEG study: VPA, LEV  Technical aspects: This EEG study was done with scalp electrodes positioned according to the 10-20 International system of electrode placement. Electrical activity was acquired at a sampling rate of 500Hz  and reviewed with a high frequency filter of 70Hz  and a low frequency filter of 1Hz . EEG data were recorded continuously and digitally stored.    DESCRIPTION: EEG showed continuous generalized 3 to 6 Hz theta delta slowing, maximal in right posterior quadrant.  . There were also PLED like ( 0.5Hz ) sharp waves in right posterior quadrant.  Intermittent left arm jerking was annotated by bedside EEG tech.  Concomitant EEG before during and after the event did not show any seizure. Hyperventilation and photic stimulation were not performed.  IMPRESSION: This study is suggestive of epileptogenicity in right posterior quadrant as well as cortical dysfunction due to underlying structural abnormality in the same region.  Intermittent left arm jerking was recorded without clear EEG changes.  However, focal seizures without alteration of awareness can sometimes not be seen on surface EEG.  Therefore, clinical correlation is recommended.        Tobby Fawcett Barbra Sarks

## 2019-02-13 NOTE — Progress Notes (Signed)
Stat EEG completed.  Frequent right posterior quadrant spikes.  Will leave on LTM EEG.  Antiepileptics as before.  -- Amie Portland, MD [facility] Pager: [phone] If 7pm to 7am, please call on call as listed on AMION.

## 2019-02-13 NOTE — Procedures (Signed)
Patient Name: Darrell Clark  MRN: [id number]  Epilepsy Attending: Lora Havens  Referring Physician/Provider: Dr Dana Allan Duration: 02/11/2019 2308 to 02/13/2019 2309  Patient history: 85yo M with seizure like activity. EEG to evaluate for seizure.   Level of alertness: awake, asleep  AEDs during EEG study: VPA, LEV  Technical aspects: This EEG study was done with scalp electrodes positioned according to the 10-20 International system of electrode placement. Electrical activity was acquired at a sampling rate of 500Hz  and reviewed with a high frequency filter of 70Hz  and a low frequency filter of 1Hz . EEG data were recorded continuously and digitally stored.   DESCRIPTION: EEG showed continuous generalized 3 to 6 Hz theta delta slowing, maximal in right posterior quadrant.  . There were also sharp waves in right posterior quadrant. Left arm jerking was also noted.  Concomitant EEG did not show any seizure. Hyperventilation and photic stimulation were not performed. Parts of the study were difficult to interpret due to eeg leads being displaced.   IMPRESSION: This study is suggestive of epileptogenicity in right posterior quadrant as well as cortical dysfunction due to underlying structural abnormality in the same region.  Left arm jerking was recorded without clear EEG changes.  However, focal seizures without alteration of awareness can sometimes not be seen on surface EEG.  Therefore, clinical correlation is recommended.

## 2019-02-13 NOTE — Plan of Care (Signed)
Patient confused easily reoriented.  Needs continual education on care plans. Problem: Education: Goal: Knowledge of General Education information will improve Description: Including pain rating scale, medication(s)/side effects and non-pharmacologic comfort measures Outcome: Not Progressing   Problem: Health Behavior/Discharge Planning: Goal: Ability to manage health-related needs will improve Outcome: Not Progressing   Problem: Clinical Measurements: Goal: Ability to maintain clinical measurements within normal limits will improve Outcome: Not Progressing Goal: Will remain free from infection Outcome: Not Progressing Goal: Diagnostic test results will improve Outcome: Not Progressing Goal: Respiratory complications will improve Outcome: Not Progressing Goal: Cardiovascular complication will be avoided Outcome: Not Progressing   Problem: Activity: Goal: Risk for activity intolerance will decrease Outcome: Not Progressing   Problem: Nutrition: Goal: Adequate nutrition will be maintained Outcome: Not Progressing   Problem: Coping: Goal: Level of anxiety will decrease Outcome: Not Progressing   Problem: Elimination: Goal: Will not experience complications related to bowel motility Outcome: Not Progressing Goal: Will not experience complications related to urinary retention Outcome: Not Progressing   Problem: Pain Managment: Goal: General experience of comfort will improve Outcome: Not Progressing   Problem: Safety: Goal: Ability to remain free from injury will improve Outcome: Not Progressing   Problem: Skin Integrity: Goal: Risk for impaired skin integrity will decrease Outcome: Not Progressing   Problem: Education: Goal: Knowledge of General Education information will improve Description: Including pain rating scale, medication(s)/side effects and non-pharmacologic comfort measures Outcome: Not Progressing   Problem: Health Behavior/Discharge Planning: Goal:  Ability to manage health-related needs will improve Outcome: Not Progressing   Problem: Clinical Measurements: Goal: Ability to maintain clinical measurements within normal limits will improve Outcome: Not Progressing Goal: Will remain free from infection Outcome: Not Progressing Goal: Diagnostic test results will improve Outcome: Not Progressing Goal: Respiratory complications will improve Outcome: Not Progressing Goal: Cardiovascular complication will be avoided Outcome: Not Progressing   Problem: Activity: Goal: Risk for activity intolerance will decrease Outcome: Not Progressing   Problem: Nutrition: Goal: Adequate nutrition will be maintained Outcome: Not Progressing   Problem: Coping: Goal: Level of anxiety will decrease Outcome: Not Progressing   Problem: Elimination: Goal: Will not experience complications related to bowel motility Outcome: Not Progressing Goal: Will not experience complications related to urinary retention Outcome: Not Progressing   Problem: Pain Managment: Goal: General experience of comfort will improve Outcome: Not Progressing   Problem: Safety: Goal: Ability to remain free from injury will improve Outcome: Not Progressing   Problem: Skin Integrity: Goal: Risk for impaired skin integrity will decrease Outcome: Not Progressing

## 2019-02-13 NOTE — Progress Notes (Signed)
PROGRESS NOTE  Darrell Clark [id number] DOB: 1932-11-07   PCP: Maryland Pink, MD  Patient is from: Home via [facility]  DOA: 01/26/2019 LOS: 1  Brief Narrative / Interim history: 85 year old male with history of dementia, CHF, CAD, PAD, DM-2, HTN, HLD, prostate cancer and obesity who presented to [facility] on 8/26 with seizure and transferred to: 8/27 for EEG.  Patient was loaded with Depakote 20 mg/KG and continued on 50 mg/KG/day in 3 divided doses at [facility].  CT head w/o contrast at [facility] revealed interval development of ill-defined right-sided subcortical hypodensities concerning for infarction or edema.  MRI brain W/O contrast revealed cortical subcortical edema within right occipital lobe at the right frontoparietal junction concerning for subacute infarction or neoplastic process.  MRI brain W/contrast showed moderate area of enhancement in the right occipital lobe and small areas of enhancement in the right frontoparietal white matter favoring mass especially GBM and a subacute right PCA finding concerning for mass versus infarct.  On arrival here, evaluated by neurology.  Stat EEG and LTM EEG were ordered by neurology.  He was loaded with Keppra 2000 mg IV once.  Continued on Depakote 500 mg 3 times daily, Keppra 500 mg twice daily, and Klonopin 0.25 mg 3 times daily.  He was also started on dexamethasone 4 mg every 6 hours IV.  Patient's EEG revealed epileptiform discharge in the right posterior quadrant.  He is currently on LTM EEG.  Subjective: Patient has jerking movements of his left upper extremity.  He opens his eyes and responds to voice but cannot hold a reasonable conversation.  He was not able to participate in neuro exam.  Objective: Vitals:   02/13/19 0355 02/13/19 0800 02/13/19 0848 02/13/19 1244  BP: (!) 170/90  (!) 154/102 119/71  Pulse: 79 87 85 83  Resp: (!) 22 19 (!) 22 (!) 24  Temp: 98.3 F (36.8 C)  97.9 F (36.6 C) 98.5 F (36.9 C)  TempSrc: Oral  Oral Oral   SpO2: 97% 94% 94% 97%    Intake/Output Summary (Last 24 hours) at 02/13/2019 1446 Last data filed at 02/13/2019 1414 Gross per 24 hour  Intake 695.32 ml  Output 700 ml  Net -4.68 ml   There were no vitals filed for this visit.  Examination:  GENERAL: No acute distress.  Sleepy.  Briefly opens his eyes and responds to voice.  Obese. HEENT: MMM. NECK: Supple.  No apparent JVD.  RESP:  No IWOB.  Fair air movement bilaterally. CVS:  RRR. Heart sounds normal.  ABD/GI/GU: Bowel sounds present. Soft. Non tender.  MSK/EXT:  Moves extremities.  Intermittent jerking of LUE. SKIN: no apparent skin lesion or wound NEURO: Sleepy.  Briefly opens his eyes and responds to voice.  Does not follow command.  Notable jerking of LUE.  Further exam limited as patient was not able to participate. PSYCH: Calm.  Assessment & Plan: Seizure likely due to possible brain mass/GBM and/or subacute occipital and right frontoparietal CVA -Continues to have LUE jerking but reportedly improved. -Imaging as above. -EEG concerning for epileptiform discharge from right posterior quadrant -On Keppra, Depakote, Klonopin and PRN Ativan per neurology -Continue dexamethasone for vasogenic edema -Currently on LVM EEG. -Fall, aspiration and seizure precautions.  Acute metabolic encephalopathy in patient with dementia without behavioral disturbance likely due to seizure. -Treat treatable causes. -Delirium precautions.  Controlled NIDDM-2 with hyperglycemia: Not on medications.  A1c 6.5%.  CBG elevated. -CBG monitoring and SSI  HTN: Normotensive. -PRN labetalol -Resume home meds when  able.  History of CHF/CAD/PAD: Stable no cardiopulmonary issues. -Resume home meds when able  History of prostate cancer s/p status post radiation in 2011 in remission. -Outpatient follow-up with urologist  DVT prophylaxis: SCD Code Status: Full code Family Communication: Patient and/or RN. Available if any question. Disposition  Plan: Remains inpatient Consultants: Neurology  Procedures:  8/27-EEG-epileptiform discharge from posterior quadrant 8/27-LVM EEG started.  Microbiology summarized: None  Antimicrobials: Anti-infectives (From admission, onward)   None      Sch Meds:  Scheduled Meds: . clonazepam  0.5 mg Oral TID  . dexamethasone (DECADRON) injection  4 mg Intravenous Q6H  . insulin aspart  0-5 Units Subcutaneous QHS  . insulin aspart  0-9 Units Subcutaneous TID WC   Continuous Infusions: . levETIRAcetam    . valproate sodium 500 mg (02/13/19 1304)   PRN Meds:.labetalol, LORazepam   I have personally reviewed the following labs and images: CBC: Recent Labs  Lab 02/11/19 1252 02/09/2019 0540 02/13/19 0101  WBC 11.3* 12.2* 8.7  NEUTROABS  --   --  7.7  HGB 15.5 15.5 16.3  HCT 47.3 47.8 48.7  MCV 94.6 95.2 93.1  PLT 168 185 194   BMP &GFR Recent Labs  Lab 02/11/19 1252 01/28/2019 0540 02/13/19 0101 02/13/19 0656  NA 142 140 136 136  K 4.1 5.9* 4.5 4.4  CL 103 101 100 101  CO2 24 26 22  21*  GLUCOSE 189* 151* 173* 177*  BUN 14 17 20 21   CREATININE 1.15 1.07 1.08 1.06  CALCIUM 9.1 9.7 9.7 9.8  MG  --   --  2.0  --   PHOS  --   --  3.8  --    Estimated Creatinine Clearance: 63.5 mL/min (by C-G formula based on SCr of 1.06 mg/dL). Liver & Pancreas: Recent Labs  Lab 02/11/19 1252 02/13/19 0101 02/13/19 0656  AST 28  --  64*  ALT 16  --  26  ALKPHOS 76  --  71  BILITOT 0.7  --  1.2  PROT 7.3  --  7.3  ALBUMIN 4.0 3.6 3.7   No results for input(s): LIPASE, AMYLASE in the last 168 hours. No results for input(s): AMMONIA in the last 168 hours. Diabetic: Recent Labs    02/11/19 1834 01/27/2019 0540  HGBA1C 6.4* 6.5*   Recent Labs  Lab 01/18/2019 1153 02/14/2019 1625 02/04/2019 2354 02/13/19 0847 02/13/19 1240  GLUCAP 116* 229* 162* 163* 205*   Cardiac Enzymes: No results for input(s): CKTOTAL, CKMB, CKMBINDEX, TROPONINI in the last 168 hours. No results for  input(s): PROBNP in the last 8760 hours. Coagulation Profile: No results for input(s): INR, PROTIME in the last 168 hours. Thyroid Function Tests: No results for input(s): TSH, T4TOTAL, FREET4, T3FREE, THYROIDAB in the last 72 hours. Lipid Profile: Recent Labs    02/11/2019 0540  CHOL 134  HDL 50  LDLCALC 69  TRIG 77  CHOLHDL 2.7   Anemia Panel: No results for input(s): VITAMINB12, FOLATE, FERRITIN, TIBC, IRON, RETICCTPCT in the last 72 hours. Urine analysis:    Component Value Date/Time   COLORURINE YELLOW (A) 02/11/2019 1340   APPEARANCEUR CLEAR (A) 02/11/2019 1340   APPEARANCEUR Clear 01/19/2013 1937   LABSPEC 1.019 02/11/2019 1340   LABSPEC 1.017 01/19/2013 1937   PHURINE 5.0 02/11/2019 1340   GLUCOSEU NEGATIVE 02/11/2019 1340   GLUCOSEU Negative 01/19/2013 1937   HGBUR NEGATIVE 02/11/2019 1340   BILIRUBINUR NEGATIVE 02/11/2019 1340   BILIRUBINUR Negative 01/19/2013 1937   KETONESUR 5 (  A) 02/11/2019 1340   PROTEINUR NEGATIVE 02/11/2019 1340   NITRITE NEGATIVE 02/11/2019 1340   LEUKOCYTESUR NEGATIVE 02/11/2019 1340   LEUKOCYTESUR Negative 01/19/2013 1937   Sepsis Labs: Invalid input(s): PROCALCITONIN, [place]  Microbiology: Recent Results (from the past 240 hour(s))  Novel Coronavirus, NAA (Hosp order, Send-out to Ref Lab; TAT 18-24 hrs     Status: None   Collection Time: 02/11/19  1:40 PM   Specimen: Nasopharyngeal Swab; Respiratory  Result Value Ref Range Status   SARS-CoV-2, NAA NOT DETECTED NOT DETECTED Final    Comment: (NOTE) This test was developed and its performance characteristics determined by [employer]. This test has not been FDA cleared or approved. This test has been authorized by FDA under an Emergency Use Authorization (EUA). This test is only authorized for the duration of time the declaration that circumstances exist justifying the authorization of the emergency use of in vitro diagnostic tests for detection of SARS-CoV-2 virus  and/or diagnosis of COVID-19 infection under section 564(b)(1) of the Act, 21 U.S.C. [id number]), unless the authorization is terminated or revoked sooner. When diagnostic testing is negative, the possibility of a false negative result should be considered in the context of a patient's recent exposures and the presence of clinical signs and symptoms consistent with COVID-19. An individual without symptoms of COVID-19 and who is not shedding SARS-CoV-2 virus would expect to have a negative (not detected) result in this assay. Performed  At: [facility] [place], [place] [id number] Rush Farmer MD [id number]    [place]  Final    Comment: Performed at [facility], [address] [place], [place] [address]    Radiology Studies: No results found.  35 minutes with more than 50% spent in reviewing records, counseling patient and coordinating care.  Taye T. English  If 7PM-7AM, please contact night-coverage [email or web address] Password [facility] 02/13/2019, 2:46 PM

## 2019-02-13 NOTE — Progress Notes (Signed)
LTM maintenance  No skin breakdown

## 2019-02-14 DIAGNOSIS — E118 Type 2 diabetes mellitus with unspecified complications: Secondary | ICD-10-CM

## 2019-02-14 LAB — GLUCOSE, CAPILLARY
Glucose-Capillary: 160 mg/dL — ABNORMAL HIGH (ref 70–99)
Glucose-Capillary: 166 mg/dL — ABNORMAL HIGH (ref 70–99)
Glucose-Capillary: 150 mg/dL — ABNORMAL HIGH (ref 70–99)
Glucose-Capillary: 161 mg/dL — ABNORMAL HIGH (ref 70–99)

## 2019-02-14 LAB — VALPROIC ACID LEVEL: Valproic Acid Lvl: 52 ug/mL (ref 50.0–100.0)

## 2019-02-14 LAB — MRSA PCR SCREENING: MRSA by PCR: NEGATIVE

## 2019-02-14 MED ORDER — VALPROATE SODIUM 500 MG/5ML IV SOLN
750.0000 mg | Freq: Three times a day (TID) | INTRAVENOUS | Status: DC
  Administered 2019-02-14 – 2019-02-15 (×4): 750 mg via INTRAVENOUS
  Filled 2019-02-14 (×6): qty 7.5

## 2019-02-14 MED ORDER — SODIUM CHLORIDE 0.9 % IV SOLN
200.0000 mg | Freq: Once | INTRAVENOUS | Status: AC
  Administered 2019-02-15: 200 mg via INTRAVENOUS
  Filled 2019-02-14: qty 20

## 2019-02-14 MED ORDER — SODIUM CHLORIDE 0.9 % IV SOLN
100.0000 mg | Freq: Two times a day (BID) | INTRAVENOUS | Status: DC
  Administered 2019-02-15 – 2019-02-17 (×5): 100 mg via INTRAVENOUS
  Filled 2019-02-14 (×6): qty 10

## 2019-02-14 NOTE — Progress Notes (Signed)
Subjective: LTM is ongoing. Has had outbursts of crying with disorientation.   Objective: Current vital signs: BP 134/67 (BP Location: Right Wrist)   Pulse 62   Temp 98 F (36.7 C) (Oral)   Resp 19   SpO2 98%  Vital signs in last 24 hours: Temp:  [97.9 F (36.6 C)-99.3 F (37.4 C)] 98 F (36.7 C) (08/29 0332) Pulse Rate:  [62-87] 62 (08/29 0601) Resp:  [19-25] 19 (08/29 0601) BP: (119-154)/(67-102) 134/67 (08/29 0332) SpO2:  [91 %-98 %] 98 % (08/29 0601)  Intake/Output from previous day: 08/28 0701 - 08/29 0700 In: 802.5 [P.O.:290; IV Piggyback:512.5] Out: 1150 [Urine:1150] Intake/Output this shift: No intake/output data recorded. Nutritional status:  Diet Order            DIET - DYS 1 Room service appropriate? Yes with Assist; Fluid consistency: Thin  Diet effective now              Neurologic Exam: Ment: Somnolent. Not following commands except will open eyes briefly when asked. CN: Face symmetric.  Motor: LUE non-rhythmic jerking that is continuous. Left hemiparesis is also noted.  Sensory: Poor response to tactile stimuli Cerebellar/Gait: Unable to assess.   Lab Results: Results for orders placed or performed during the hospital encounter of 01/23/2019 (from the past 48 hour(s))  Glucose, capillary     Status: Abnormal   Collection Time: 01/29/2019 11:54 PM  Result Value Ref Range   Glucose-Capillary 162 (H) 70 - 99 mg/dL   Comment 1 Notify RN    Comment 2 Document in Chart   MRSA PCR Screening     Status: None   Collection Time: 02/13/19 12:23 AM   Specimen: Nasal Mucosa; Nasopharyngeal  Result Value Ref Range   MRSA by PCR NEGATIVE NEGATIVE    Comment:        The GeneXpert MRSA Assay (FDA approved for NASAL specimens only), is one component of a comprehensive MRSA colonization surveillance program. It is not intended to diagnose MRSA infection nor to guide or monitor treatment for MRSA infections. Performed at [facility], [place] [address], [place], [place] [address]   Renal function panel     Status: Abnormal   Collection Time: 02/13/19  1:01 AM  Result Value Ref Range   Sodium 136 135 - 145 mmol/L   Potassium 4.5 3.5 - 5.1 mmol/L   Chloride 100 98 - 111 mmol/L   CO2 22 22 - 32 mmol/L   Glucose, Bld 173 (H) 70 - 99 mg/dL   BUN 20 8 - 23 mg/dL   Creatinine, Ser 1.08 0.61 - 1.24 mg/dL   Calcium 9.7 8.9 - 10.3 mg/dL   Phosphorus 3.8 2.5 - 4.6 mg/dL   Albumin 3.6 3.5 - 5.0 g/dL   GFR calc non Af Amer >60 >60 mL/min   GFR calc Af Amer >60 >60 mL/min   Anion gap 14 5 - 15    Comment: Performed at [facility], [place] [address] [place], [place] [address]  Magnesium     Status: None   Collection Time: 02/13/19  1:01 AM  Result Value Ref Range   Magnesium 2.0 1.7 - 2.4 mg/dL    Comment: Performed at [facility] [address] [place], [place] [address]  CBC with Differential/Platelet     Status: None   Collection Time: 02/13/19  1:01 AM  Result Value Ref Range   WBC 8.7 4.0 - 10.5 K/uL   RBC 5.23 4.22 -  5.81 MIL/uL   Hemoglobin 16.3 13.0 - 17.0 g/dL   HCT 48.7 39.0 - 52.0 %   MCV 93.1 80.0 - 100.0 fL   MCH 31.2 26.0 - 34.0 pg   MCHC 33.5 30.0 - 36.0 g/dL   RDW 14.0 11.5 - 15.5 %   Platelets 194 150 - 400 K/uL   nRBC 0.0 0.0 - 0.2 %   Neutrophils Relative % 89 %   Neutro Abs 7.7 1.7 - 7.7 K/uL   Lymphocytes Relative 9 %   Lymphs Abs 0.8 0.7 - 4.0 K/uL   Monocytes Relative 2 %   Monocytes Absolute 0.2 0.1 - 1.0 K/uL   Eosinophils Relative 0 %   Eosinophils Absolute 0.0 0.0 - 0.5 K/uL   Basophils Relative 0 %   Basophils Absolute 0.0 0.0 - 0.1 K/uL   Immature Granulocytes 0 %   Abs Immature Granulocytes 0.03 0.00 - 0.07 K/uL    Comment: Performed at [facility] [address], [place], [place] [address]  Lactic acid, plasma     Status: None   Collection Time: 02/13/19  1:01 AM  Result Value Ref Range   Lactic Acid, Venous 1.7 0.5 - 1.9 mmol/L    Comment: Performed at [facility] [address] [place], [place] [address]  Valproic acid level     Status: None   Collection Time: 02/13/19  6:56 AM  Result Value Ref Range   Valproic Acid Lvl 67 50.0 - 100.0 ug/mL    Comment: Performed at [facility] [address] [place], [place] [address]  Comprehensive metabolic panel     Status: Abnormal   Collection Time: 02/13/19  6:56 AM  Result Value Ref Range   Sodium 136 135 - 145 mmol/L   Potassium 4.4 3.5 - 5.1 mmol/L   Chloride 101 98 - 111 mmol/L   CO2 21 (L) 22 - 32 mmol/L   Glucose, Bld 177 (H) 70 - 99 mg/dL   BUN 21 8 - 23 mg/dL   Creatinine, Ser 1.06 0.61 - 1.24 mg/dL   Calcium 9.8 8.9 - 10.3 mg/dL   Total Protein 7.3 6.5 - 8.1 g/dL   Albumin 3.7 3.5 - 5.0 g/dL   AST 64 (H) 15 - 41 U/L   ALT 26 0 - 44 U/L   Alkaline Phosphatase 71 38 - 126 U/L   Total Bilirubin 1.2 0.3 - 1.2 mg/dL   GFR calc non Af Amer >60 >60 mL/min   GFR calc Af Amer >60 >60 mL/min   Anion gap 14 5 - 15    Comment: Performed at [facility] [address], [place], [place] [address]  Glucose, capillary     Status: Abnormal   Collection Time: 02/13/19  8:47 AM  Result Value Ref Range   Glucose-Capillary 163 (H) 70 - 99 mg/dL  Glucose, capillary     Status: Abnormal   Collection Time: 02/13/19 12:40 PM  Result Value Ref Range   Glucose-Capillary 205 (H) 70 - 99 mg/dL  Glucose, capillary     Status: Abnormal   Collection Time: 02/13/19  4:03 PM  Result Value Ref Range   Glucose-Capillary 154 (H) 70 - 99 mg/dL  Glucose, capillary     Status: Abnormal   Collection Time: 02/13/19  9:10 PM  Result Value Ref Range   Glucose-Capillary 161 (H) 70 - 99 mg/dL   Comment 1 Notify RN    Comment 2 Document in Chart  Recent Results (from the past 240 hour(s))  Novel Coronavirus, NAA (Hosp order, Send-out to Ref Lab; TAT 18-24 hrs     Status: None   Collection Time: 02/11/19  1:40 PM   Specimen: Nasopharyngeal Swab; Respiratory  Result Value Ref Range Status   SARS-CoV-2,  NAA NOT DETECTED NOT DETECTED Final    Comment: (NOTE) This test was developed and its performance characteristics determined by [employer]. This test has not been FDA cleared or approved. This test has been authorized by FDA under an Emergency Use Authorization (EUA). This test is only authorized for the duration of time the declaration that circumstances exist justifying the authorization of the emergency use of in vitro diagnostic tests for detection of SARS-CoV-2 virus and/or diagnosis of COVID-19 infection under section 564(b)(1) of the Act, 21 U.S.C. [id number]), unless the authorization is terminated or revoked sooner. When diagnostic testing is negative, the possibility of a false negative result should be considered in the context of a patient's recent exposures and the presence of clinical signs and symptoms consistent with COVID-19. An individual without symptoms of COVID-19 and who is not shedding SARS-CoV-2 virus would expect to have a negative (not detected) result in this assay. Performed  At: [facility] [address] [place], [place] [id number] Rush Farmer MD [id number]    [place]  Final    Comment: Performed at [facility], [place] [place], [place] [address]  MRSA PCR Screening     Status: None   Collection Time: 02/13/19 12:23 AM   Specimen: Nasal Mucosa; Nasopharyngeal  Result Value Ref Range Status   MRSA by PCR NEGATIVE NEGATIVE Final    Comment:        The GeneXpert MRSA Assay (FDA approved for NASAL specimens only), is one component of a comprehensive MRSA colonization surveillance program. It is not intended to diagnose MRSA infection nor to guide or monitor treatment for MRSA infections. Performed at [facility], [place] [address], [place], [place] [address]     Lipid Panel Recent Labs    02/09/2019 0540  CHOL 134  TRIG 77  HDL 50  CHOLHDL 2.7  VLDL 15  LDLCALC 69     Studies/Results: Mr Brain W Contrast  Result Date: 02/06/2019 CLINICAL DATA:  Subacute neuro deficit EXAM: MRI HEAD WITH CONTRAST TECHNIQUE: Multiplanar, multiecho pulse sequences of the brain and surrounding structures were obtained with intravenous contrast. CONTRAST:  10 cc Gadavist intravenous COMPARISON:  Brain MRI from yesterday and CT CTA from the day before. Brain MRI 07/12/2018 FINDINGS: Moderate area of contiguous enhancement in the right occipital lobe extending from the occipital horn of the ventricle to the cortex. There is central non enhancement at this level. There are aligned patchy areas of white matter enhancement in the right posterior frontal and parietal region. Enhancement is much less extensive than the extent of T2 and FLAIR signal. No other areas of enhancement. IMPRESSION: Moderate area of enhancement in the right occipital lobe and small areas of enhancement in the right frontal parietal white matter which is less extensive than the extent of T2 signal. A mass is favored, especially glioblastoma. Subacute right PCA infarct with watershed ischemia in the right cerebral white matter is a differential consideration, but would not expect the necrotic enhancement pattern at the level of the occipital lobe. Follow-up in 2-3 weeks may be helpful. Electronically Signed   By: Monte Fantasia M.D.   On: 02/16/2019 11:38    Medications:  Scheduled: . clonazepam  1 mg Oral TID  . dexamethasone (DECADRON) injection  4 mg Intravenous Q6H  . insulin aspart  0-5 Units Subcutaneous QHS  . insulin aspart  0-9 Units Subcutaneous TID WC   Continuous: . levETIRAcetam Stopped (02/14/19 1058)  . valproate sodium Stopped (02/14/19 1429)   MRI brain without contrast: 1. New areas of cortical/subcortical edema within the right occipital lobe and at the right frontoparietal junction. No associated diffusion restriction. These may indicate areas of subacute infarction. A neoplastic or  infectious/inflammatory process could also cause these findings. Further imaging with intravenous contrast administration may be helpful. 2. Chronic small vessel ischemia.  MRI brain with contrast: Moderate area of enhancement in the right occipital lobe and small areas of enhancement in the right frontal parietal white matter which is less extensive than the extent of T2 signal. A mass is favored, especially glioblastoma. Subacute right PCA infarct with watershed ischemia in the right cerebral white matter is a differential consideration, but would not expect the necrotic enhancement pattern at the level of the occipital lobe. Follow-up in 2-3 weeks may be helpful.  LTM EEG report from today: This study issuggestive of epileptogenicity in right posterior quadrant as well as cortical dysfunction due to underlying structural abnormality in the same region. Left arm jerking was recorded without clear EEG changes.However, focal seizures without alteration of awareness can sometimes not be seen on surface EEG. Therefore, clinical correlation is recommended.  Assessment: 1. Exam reveals somnolence with non-rhythmic LUE jerking that is continuous. Left hemiparesis is also noted.  2. 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  3. EEG tracings are suggestive of epileptogenicity in right posterior quadrant as well as cortical dysfunction due to underlying structural abnormality in the same region. Left arm jerking was recorded without clear EEG changes.However, focal seizures without alteration of awareness can sometimes not be seen on surface EEG.  3. Valproic acid level this AM (8/29) 67. Will increase scheduled dosing to 750 mg TID 4. Valproic acid and ammonia levels ordered for tomorrow AM.  5. Will reassess later today. If clinical seizure activity is present, most likely will add Vimpat and increase Klonopin to 1.5 mg TID. If still twitching on evening follow up exam, then  may add Dilantin, per Dr. Hortense Ramal.   Recommendations: 1. Continue LTM EEG 2. Continue Decadron 3. Increased VPA to 750 mg IV TID this morning.   Addendum:  Clinical seizure activity still present during evening exam. Loading Vimpat 200 mg followed by scheduled dosing at 100 mg IV BID. Will hold off on increasing Klonopin due to somnolence.     LOS: 2 days   @Electronically  signed: Dr. Kerney Elbe 02/14/2019  7:56 AM

## 2019-02-14 NOTE — Progress Notes (Signed)
PROGRESS NOTE  Darrell Clark [id number] DOB: [birth year]   PCP: Maryland Pink, MD  Patient is from: Home via [facility]  DOA: 01/21/2019 LOS: 2  Brief Narrative / Interim history: 85 year old male with history of dementia, CHF, CAD, PAD, DM-2, HTN, HLD, prostate cancer and obesity who presented to [facility] on 8/26 with seizure and transferred to: 8/27 for EEG.  Patient was loaded with Depakote 20 mg/KG and continued on 50 mg/KG/day in 3 divided doses at [facility].  CT head w/o contrast at [facility] revealed interval development of ill-defined right-sided subcortical hypodensities concerning for infarction or edema.  MRI brain W/O contrast revealed cortical subcortical edema within right occipital lobe at the right frontoparietal junction concerning for subacute infarction or neoplastic process.  MRI brain W/contrast showed moderate area of enhancement in the right occipital lobe and small areas of enhancement in the right frontoparietal white matter favoring mass especially GBM and a subacute right PCA finding concerning for mass versus infarct.  On arrival here, evaluated by neurology.  Stat EEG and LTM EEG were ordered by neurology.  He was loaded with Keppra 2000 mg IV once.  Continued on Depakote 500 mg 3 times daily, Keppra 500 mg twice daily, and Klonopin 0.25 mg 3 times daily.  He was also started on dexamethasone 4 mg every 6 hours IV.  Patient's EEG revealed epileptiform discharge in the right posterior quadrant.  He is currently on LTM EEG.  Subjective: No major events overnight of this morning.  Continues to have intermittent twitching of left upper extremity.  Minimally follows command.  Not able to hold conversation with me.  Grip strength seems to be symmetric.  Objective: Vitals:   02/14/19 0332 02/14/19 0601 02/14/19 0817 02/14/19 1120  BP: 134/67  (!) 148/78 117/73  Pulse: 63 62 61 71  Resp: (!) 24 19 (!) 21 18  Temp: 98 F (36.7 C)  98.1 F (36.7 C) 98.4 F (36.9 C)  TempSrc:  Oral  Oral Axillary  SpO2: 98% 98% 93% 95%    Intake/Output Summary (Last 24 hours) at 02/14/2019 1439 Last data filed at 02/14/2019 1100 Gross per 24 hour  Intake 312.5 ml  Output 1350 ml  Net -1037.5 ml   There were no vitals filed for this visit.  Examination:  GENERAL: No acute distress.  Awake.  Minimally follows command.  HEENT: MMM. Arcus senilis.Marland Kitchen  PERRL. NECK: Supple.  No apparent JVD.  RESP:  No IWOB. Good air movement bilaterally. CVS:  RRR.  2/6 SEM over RUSB.  Heart sounds normal.  ABD/GI/GU: Bowel sounds present. Soft. Non tender.  MSK/EXT:  Moves extremities. No apparent deformity or edema.  SKIN: no apparent skin lesion or wound NEURO: Awake.  Minimally follows command.  Ongoing LUE twitching.  PERRL.  Grip strength seems to be symmetric.  Patellar reflex symmetric. PSYCH: Calm.  Assessment & Plan: Seizure likely due to possible brain mass/GBM and/or subacute occipital and right frontoparietal CVA -Continues to have LUE jerking but improved. -Minimally follows command. -Imaging as above. -EEG concerning for epileptiform discharge from right posterior quadrant -On Keppra, Depakote, Klonopin and PRN Ativan per neurology -Continue dexamethasone for vasogenic edema -Currently on LVM EEG. -Fall, aspiration and seizure precautions.  Acute metabolic encephalopathy in patient with dementia without behavioral disturbance likely due to seizure. -Treat treatable causes. -Delirium precautions.  Controlled NIDDM-2 with hyperglycemia: Not on medications.  A1c 6.5%.  CBG within fair range. -SSI and CBG monitoring  HTN: Normotensive. -PRN labetalol -Resume home meds when able.  History of CHF/CAD/PAD: Stable no cardiopulmonary issues. -Resume home meds when able  History of prostate cancer s/p status post radiation in 2011 in remission. -Outpatient follow-up with urologist  DVT prophylaxis: SCD Code Status: Full code Family Communication: Patient and/or RN.  Available if any question. Disposition Plan: Remains inpatient Consultants: Neurology  Procedures:  8/27-EEG-epileptiform discharge from posterior quadrant 8/27-LVM EEG ongoing.  Microbiology summarized: None  Antimicrobials: Anti-infectives (From admission, onward)   None      Sch Meds:  Scheduled Meds: . clonazepam  1 mg Oral TID  . dexamethasone (DECADRON) injection  4 mg Intravenous Q6H  . insulin aspart  0-5 Units Subcutaneous QHS  . insulin aspart  0-9 Units Subcutaneous TID WC   Continuous Infusions: . levETIRAcetam 1,500 mg (02/14/19 1043)  . valproate sodium 750 mg (02/14/19 1329)   PRN Meds:.labetalol, LORazepam   I have personally reviewed the following labs and images: CBC: Recent Labs  Lab 02/11/19 1252 02/02/2019 0540 02/13/19 0101  WBC 11.3* 12.2* 8.7  NEUTROABS  --   --  7.7  HGB 15.5 15.5 16.3  HCT 47.3 47.8 48.7  MCV 94.6 95.2 93.1  PLT 168 185 194   BMP &GFR Recent Labs  Lab 02/11/19 1252 02/10/2019 0540 02/13/19 0101 02/13/19 0656  NA 142 140 136 136  K 4.1 5.9* 4.5 4.4  CL 103 101 100 101  CO2 24 26 22  21*  GLUCOSE 189* 151* 173* 177*  BUN 14 17 20 21   CREATININE 1.15 1.07 1.08 1.06  CALCIUM 9.1 9.7 9.7 9.8  MG  --   --  2.0  --   PHOS  --   --  3.8  --    Estimated Creatinine Clearance: 63.5 mL/min (by C-G formula based on SCr of 1.06 mg/dL). Liver & Pancreas: Recent Labs  Lab 02/11/19 1252 02/13/19 0101 02/13/19 0656  AST 28  --  64*  ALT 16  --  26  ALKPHOS 76  --  71  BILITOT 0.7  --  1.2  PROT 7.3  --  7.3  ALBUMIN 4.0 3.6 3.7   No results for input(s): LIPASE, AMYLASE in the last 168 hours. No results for input(s): AMMONIA in the last 168 hours. Diabetic: Recent Labs    02/11/19 1834 01/18/2019 0540  HGBA1C 6.4* 6.5*   Recent Labs  Lab 02/13/19 1240 02/13/19 1603 02/13/19 2110 02/14/19 0813 02/14/19 1118  GLUCAP 205* 154* 161* 160* 166*   Cardiac Enzymes: No results for input(s): CKTOTAL, CKMB,  CKMBINDEX, TROPONINI in the last 168 hours. No results for input(s): PROBNP in the last 8760 hours. Coagulation Profile: No results for input(s): INR, PROTIME in the last 168 hours. Thyroid Function Tests: No results for input(s): TSH, T4TOTAL, FREET4, T3FREE, THYROIDAB in the last 72 hours. Lipid Profile: Recent Labs    02/14/2019 0540  CHOL 134  HDL 50  LDLCALC 69  TRIG 77  CHOLHDL 2.7   Anemia Panel: No results for input(s): VITAMINB12, FOLATE, FERRITIN, TIBC, IRON, RETICCTPCT in the last 72 hours. Urine analysis:    Component Value Date/Time   COLORURINE YELLOW (A) 02/11/2019 1340   APPEARANCEUR CLEAR (A) 02/11/2019 1340   APPEARANCEUR Clear 01/19/2013 1937   LABSPEC 1.019 02/11/2019 1340   LABSPEC 1.017 01/19/2013 1937   PHURINE 5.0 02/11/2019 1340   GLUCOSEU NEGATIVE 02/11/2019 1340   GLUCOSEU Negative 01/19/2013 1937   HGBUR NEGATIVE 02/11/2019 1340   BILIRUBINUR NEGATIVE 02/11/2019 1340   BILIRUBINUR Negative 01/19/2013 1937   KETONESUR 5 (  A) 02/11/2019 1340   PROTEINUR NEGATIVE 02/11/2019 1340   NITRITE NEGATIVE 02/11/2019 1340   LEUKOCYTESUR NEGATIVE 02/11/2019 1340   LEUKOCYTESUR Negative 01/19/2013 1937   Sepsis Labs: Invalid input(s): PROCALCITONIN, [place]  Microbiology: Recent Results (from the past 240 hour(s))  Novel Coronavirus, NAA (Hosp order, Send-out to [facility]; TAT 18-24 hrs     Status: None   Collection Time: 02/11/19  1:40 PM   Specimen: Nasopharyngeal Swab; Respiratory  Result Value Ref Range Status   SARS-CoV-2, NAA NOT DETECTED NOT DETECTED Final    Comment: (NOTE) This test was developed and its performance characteristics determined by [employer]. This test has not been FDA cleared or approved. This test has been authorized by FDA under an Emergency Use Authorization (EUA). This test is only authorized for the duration of time the declaration that circumstances exist justifying the authorization of the emergency use of in  vitro diagnostic tests for detection of SARS-CoV-2 virus and/or diagnosis of COVID-19 infection under section 564(b)(1) of the Act, 21 U.S.C. KA:123727), unless the authorization is terminated or revoked sooner. When diagnostic testing is negative, the possibility of a false negative result should be considered in the context of a patient's recent exposures and the presence of clinical signs and symptoms consistent with COVID-19. An individual without symptoms of COVID-19 and who is not shedding SARS-CoV-2 virus would expect to have a negative (not detected) result in this assay. Performed  At: [facility] [address] [place], [place] [id number] Rush Farmer MD [id number]    [place]  Final    Comment: Performed at [facility], [address] [place], [place] [address]  MRSA PCR Screening     Status: None   Collection Time: 02/13/19 12:23 AM   Specimen: Nasal Mucosa; Nasopharyngeal  Result Value Ref Range Status   MRSA by PCR NEGATIVE NEGATIVE Final    Comment:        The GeneXpert MRSA Assay (FDA approved for NASAL specimens only), is one component of a comprehensive MRSA colonization surveillance program. It is not intended to diagnose MRSA infection nor to guide or monitor treatment for MRSA infections. Performed at [facility], [place] [address], [place], [place] [address]     Radiology Studies: No results found.  Tenia Goh T. [place]  If 7PM-7AM, please contact night-coverage [email or web address] Password [facility] 02/14/2019, 2:39 PM

## 2019-02-14 NOTE — Progress Notes (Signed)
Pt continues to have random outbursts of crying. Son was present in room during last outburst and voiced that patient is calling for wife and mom. Both of which have passed away. Patient will cry out loudly for about 3-5 minutes calling for wife. Left arm twitching present still. Seems improved since beginning of shift.   ATTENTION MD: Pandora Leiter would like an update regarding seizure monitoring and plan of care.   Nursing to continue to monitor.

## 2019-02-14 NOTE — Progress Notes (Signed)
maint complete.  

## 2019-02-14 NOTE — Progress Notes (Signed)
2051: Spoke w/ son Gerald Stabs) regarding care. Advised that pt currently sleeping and continuous EEG monitoring still active.

## 2019-02-15 LAB — GLUCOSE, CAPILLARY
Glucose-Capillary: 179 mg/dL — ABNORMAL HIGH (ref 70–99)
Glucose-Capillary: 197 mg/dL — ABNORMAL HIGH (ref 70–99)
Glucose-Capillary: 224 mg/dL — ABNORMAL HIGH (ref 70–99)
Glucose-Capillary: 165 mg/dL — ABNORMAL HIGH (ref 70–99)

## 2019-02-15 LAB — VALPROIC ACID LEVEL: Valproic Acid Lvl: 101 ug/mL — ABNORMAL HIGH (ref 50.0–100.0)

## 2019-02-15 LAB — AMMONIA: Ammonia: 37 umol/L — ABNORMAL HIGH (ref 9–35)

## 2019-02-15 MED ORDER — VALPROATE SODIUM 500 MG/5ML IV SOLN
500.0000 mg | Freq: Every day | INTRAVENOUS | Status: DC
  Administered 2019-02-15 – 2019-02-16 (×2): 500 mg via INTRAVENOUS
  Filled 2019-02-15 (×3): qty 5

## 2019-02-15 MED ORDER — DIVALPROEX SODIUM 500 MG PO DR TAB: 750.0000 mg | DELAYED_RELEASE_TABLET | Freq: Two times a day (BID) | ORAL | Status: DC

## 2019-02-15 MED ORDER — SODIUM CHLORIDE 0.9 % IV SOLN
2000.0000 mg | Freq: Once | INTRAVENOUS | Status: AC
  Administered 2019-02-15: 2000 mg via INTRAVENOUS
  Filled 2019-02-15: qty 40

## 2019-02-15 MED ORDER — DIVALPROEX SODIUM 500 MG PO DR TAB: 500.0000 mg | DELAYED_RELEASE_TABLET | Freq: Every day | ORAL | Status: DC

## 2019-02-15 MED ORDER — PHENYTOIN SODIUM 50 MG/ML IJ SOLN
100.0000 mg | Freq: Three times a day (TID) | INTRAMUSCULAR | Status: DC
  Administered 2019-02-15 – 2019-02-19 (×12): 100 mg via INTRAVENOUS
  Filled 2019-02-15 (×17): qty 2

## 2019-02-15 MED ORDER — VALPROATE SODIUM 500 MG/5ML IV SOLN
750.0000 mg | Freq: Two times a day (BID) | INTRAVENOUS | Status: DC
  Administered 2019-02-15 – 2019-02-17 (×4): 750 mg via INTRAVENOUS
  Filled 2019-02-15 (×5): qty 7.5

## 2019-02-15 NOTE — Progress Notes (Signed)
maint complete.  

## 2019-02-15 NOTE — Progress Notes (Signed)
PROGRESS NOTE  Darrell Clark [id number] DOB: 02-22-1933 DOA: 01/27/2019 PCP: Maryland Pink, MD  HPI/Recap of past 31 hours: 85 year old male with history of dementia, CHF, CAD, PAD, DM-2, HTN, HLD, prostate cancer and obesity who presented to [facility] on 8/26 with seizure and transferred to: 8/27 for EEG.  Patient was loaded with Depakote 20 mg/KG and continued on 50 mg/KG/day in 3 divided doses at [facility].  CT head w/o contrast at [facility] revealed interval development of ill-defined right-sided subcortical hypodensities concerning for infarction or edema.  MRI brain W/O contrast revealed cortical subcortical edema within right occipital lobe at the right frontoparietal junction concerning for subacute infarction or neoplastic process.  MRI brain W/contrast showed moderate area of enhancement in the right occipital lobe and small areas of enhancement in the right frontoparietal white matter favoring mass especially GBM and a subacute right PCA finding concerning for mass versus infarct.  On arrival here, evaluated by neurology.  Stat EEG and LTM EEG were ordered by neurology.  He was loaded with Keppra 2000 mg IV once.  Continued on Depakote 500 mg 3 times daily, Keppra 500 mg twice daily, and Klonopin 0.25 mg 3 times daily.  He was also started on dexamethasone 4 mg every 6 hours IV.  Patient's EEG revealed epileptiform discharge in the right posterior quadrant.  He is currently on LTM EEG.  02/15/19: Patient seen and examined at bedside this morning. Somnolent. No witnessed seizures per bedside RN.  AEDs adjusted by neurology. 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 Started on IV Dilantin load 2500 mg 3 times daily  Persistent acute metabolic encephalopathy most likely multifactorial secondary to brain mass with suspicion for GBM versus acute illness in the setting of dementia Treat underlying conditions  Type 2 diabetes with steroid-induced hyperglycemia Hemoglobin A1c 6.5 on 02/05/2019 Continue insulin sliding scale Avoid hypoglycemia  HTN: Normotensive. -PRN labetalol -Resume home meds when able.  History of CHF/CAD/PAD: Stable no cardiopulmonary issues. -Resume home meds when able  History of prostate cancer s/p status post radiation in 2011 in remission. -Outpatient follow-up with urologist  DVT prophylaxis: SCD Code Status: Full code Family Communication: Patient and/or RN. Available if any question. Disposition Plan: Remains inpatient Consultants: Neurology  Procedures:  8/27-EEG-epileptiform discharge from posterior quadrant 8/27-LVM EEG ongoing.  Microbiology summarized: None    Objective: Vitals:   02/14/19 2318 02/15/19 0311 02/15/19 0812 02/15/19 1243  BP: 134/74 (!) 136/91 (!) 128/96 121/65  Pulse: 86 92 72 66  Resp: (!) 21 20  20   Temp: 98.2 F (36.8 C) 98.7 F (37.1 C) 98.1 F (36.7 C) 98 F (36.7 C)  TempSrc: Oral Oral Oral   SpO2: 96% 97% 99% 97%    Intake/Output Summary (Last 24 hours) at 02/15/2019 1307 Last data filed at 02/15/2019 0500 Gross per 24 hour  Intake 335 ml  Output 1000 ml  Net -665 ml   There were no vitals filed for this visit.  Exam:  . General: 85 y.o. year-old male well developed well nourished  in no acute distress.  Drowsy and not following commands. . Cardiovascular: Regular rate and rhythm with no rubs or gallops.  No thyromegaly or JVD noted.   Marland Kitchen  Respiratory: Clear to auscultation with no wheezes or rales. Good inspiratory effort. . Abdomen: Soft nontender nondistended with normal bowel sounds x4 quadrants. . Musculoskeletal: No lower extremity edema. 2/4 pulses in all 4 extremities. Marland Kitchen Psychiatry: Unable to assess mood due to somnolence.   Data Reviewed: CBC: Recent Labs  Lab 02/11/19 1252 02/13/2019 0540 02/13/19 0101  WBC 11.3* 12.2* 8.7  NEUTROABS  --   --  7.7  HGB 15.5 15.5 16.3  HCT 47.3 47.8 48.7  MCV 94.6 95.2 93.1  PLT 168 185 [id number]   Basic Metabolic Panel: Recent Labs  Lab 02/11/19 1252 01/22/2019 0540 02/13/19 0101 02/13/19 0656  NA 142 140 136 136  K 4.1 5.9* 4.5 4.4  CL 103 101 100 101  CO2 24 26 22  21*  GLUCOSE 189* 151* 173* 177*  BUN 14 17 20 21   CREATININE 1.15 1.07 1.08 1.06  CALCIUM 9.1 9.7 9.7 9.8  MG  --   --  2.0  --   PHOS  --   --  3.8  --    GFR: Estimated Creatinine Clearance: 63.5 mL/min (by C-G formula based on SCr of 1.06 mg/dL). Liver Function Tests: Recent Labs  Lab 02/11/19 1252 02/13/19 0101 02/13/19 0656  AST 28  --  64*  ALT 16  --  26  ALKPHOS 76  --  71  BILITOT 0.7  --  1.2  PROT 7.3  --  7.3  ALBUMIN 4.0 3.6 3.7   No results for input(s): LIPASE, AMYLASE in the last 168 hours. Recent Labs  Lab 02/15/19 0657  AMMONIA 37*   Coagulation Profile: No results for input(s): INR, PROTIME in the last 168 hours. Cardiac Enzymes: No results for input(s): CKTOTAL, CKMB, CKMBINDEX, TROPONINI in the last 168 hours. BNP (last 3 results) No results for input(s): PROBNP in the last 8760 hours. HbA1C: No results for input(s): HGBA1C in the last 72 hours. CBG: Recent Labs  Lab 02/14/19 1118 02/14/19 1550 02/14/19 2121 02/15/19 0815 02/15/19 1242  GLUCAP 166* 150* 161* 179* 197*   Lipid Profile: No results for input(s): CHOL, HDL, LDLCALC, TRIG, CHOLHDL, LDLDIRECT in the last 72 hours. Thyroid Function Tests: No results for input(s): TSH, T4TOTAL, FREET4, T3FREE,  THYROIDAB in the last 72 hours. Anemia Panel: No results for input(s): VITAMINB12, FOLATE, FERRITIN, TIBC, IRON, RETICCTPCT in the last 72 hours. Urine analysis:    Component Value Date/Time   COLORURINE YELLOW (A) 02/11/2019 1340   APPEARANCEUR CLEAR (A) 02/11/2019 1340   APPEARANCEUR Clear 01/19/2013 1937   LABSPEC 1.019 02/11/2019 1340   LABSPEC 1.017 01/19/2013 1937   PHURINE 5.0 02/11/2019 1340   GLUCOSEU NEGATIVE 02/11/2019 1340   GLUCOSEU Negative 01/19/2013 1937   HGBUR NEGATIVE 02/11/2019 1340   BILIRUBINUR NEGATIVE 02/11/2019 1340   BILIRUBINUR Negative 01/19/2013 1937   KETONESUR 5 (A) 02/11/2019 1340   PROTEINUR NEGATIVE 02/11/2019 1340   NITRITE NEGATIVE 02/11/2019 1340   LEUKOCYTESUR NEGATIVE 02/11/2019 1340   LEUKOCYTESUR Negative 01/19/2013 1937   Sepsis Labs: @LABRCNTIP (procalcitonin:4,lacticidven:4)  ) Recent Results (from the past 240 hour(s))  Novel Coronavirus, NAA (Hosp order, Send-out to [facility]; TAT 18-24 hrs     Status: None   Collection Time: 02/11/19  1:40 PM   Specimen: Nasopharyngeal Swab; Respiratory  Result Value Ref Range Status   SARS-CoV-2, NAA  NOT DETECTED NOT DETECTED Final    Comment: (NOTE) This test was developed and its performance characteristics determined by [employer]. This test has not been FDA cleared or approved. This test has been authorized by FDA under an Emergency Use Authorization (EUA). This test is only authorized for the duration of time the declaration that circumstances exist justifying the authorization of the emergency use of in vitro diagnostic tests for detection of SARS-CoV-2 virus and/or diagnosis of COVID-19 infection under section 564(b)(1) of the Act, 21 U.S.C. KA:123727), unless the authorization is terminated or revoked sooner. When diagnostic testing is negative, the possibility of a false negative result should be considered in the context of a patient's recent exposures and the presence  of clinical signs and symptoms consistent with COVID-19. An individual without symptoms of COVID-19 and who is not shedding SARS-CoV-2 virus would expect to have a negative (not detected) result in this assay. Performed  At: [facility] [address] [place], [place] [id number] Rush Farmer MD [id number]    [place]  Final    Comment: Performed at [facility], [address] [place], [place] [address]  MRSA PCR Screening     Status: None   Collection Time: 02/13/19 12:23 AM   Specimen: Nasal Mucosa; Nasopharyngeal  Result Value Ref Range Status   MRSA by PCR NEGATIVE NEGATIVE Final    Comment:        The GeneXpert MRSA Assay (FDA approved for NASAL specimens only), is one component of a comprehensive MRSA colonization surveillance program. It is not intended to diagnose MRSA infection nor to guide or monitor treatment for MRSA infections. Performed at [facility], [place] [address], [place], [place] [address]       Studies: No results found.  Scheduled Meds: . clonazepam  1 mg Oral TID  . dexamethasone (DECADRON) injection  4 mg Intravenous Q6H  . insulin aspart  0-5 Units Subcutaneous QHS  . insulin aspart  0-9 Units Subcutaneous TID WC  . phenytoin (DILANTIN) IV  100 mg Intravenous Q8H    Continuous Infusions: . lacosamide (VIMPAT) IV 100 mg (02/15/19 1105)  . levETIRAcetam 1,500 mg (02/15/19 1038)  . valproate sodium    . valproate sodium       LOS: 3 days     Kayleen Memos, MD [facility] Pager [phone]  If 7PM-7AM, please contact night-coverage [email or web address] Password [email or web address] 02/15/2019, 1:07 PM

## 2019-02-15 NOTE — Progress Notes (Signed)
Subjective: Continues to be somnolent. RN with concerns regarding patient's ability to protect airway overnight.   Objective: Current vital signs: BP (!) 136/91 (BP Location: Right Arm)   Pulse 92   Temp 98.7 F (37.1 C) (Oral)   Resp 20   SpO2 97%  Vital signs in last 24 hours: Temp:  [98.1 F (36.7 C)-98.9 F (37.2 C)] 98.7 F (37.1 C) (08/30 0311) Pulse Rate:  [61-92] 92 (08/30 0311) Resp:  [18-21] 20 (08/30 0311) BP: (117-155)/(71-91) 136/91 (08/30 0311) SpO2:  [93 %-97 %] 97 % (08/30 0311)  Intake/Output from previous day: 08/29 0701 - 08/30 0700 In: 335 [P.O.:120; IV Piggyback:215] Out: 1200 [Urine:1200] Intake/Output this shift: No intake/output data recorded. Nutritional status:  Diet Order            DIET - DYS 1 Room service appropriate? No; Fluid consistency: Thin  Diet effective now              Neurologic Exam: Ment: Somnolent. Not following commands or responding to name. With noxious stimuli the patient will exclaim incoherently. Does not make eye contact or attend to stimuli.  CN: Eyes conjugate. Face symmetric.  Motor: LUE non-rhythmic jerking that is continuous, but lower in amplitude and less frequent than yesterday. Left hemiparesis is also noted.   Sensory: Will withdraw RLE to noxious and exclaim with noxious applied to RUE. Decreased response to left sided stimuli.   Lab Results: Results for orders placed or performed during the hospital encounter of 01/18/2019 (from the past 48 hour(s))  Glucose, capillary     Status: Abnormal   Collection Time: 02/13/19  8:47 AM  Result Value Ref Range   Glucose-Capillary 163 (H) 70 - 99 mg/dL  Glucose, capillary     Status: Abnormal   Collection Time: 02/13/19 12:40 PM  Result Value Ref Range   Glucose-Capillary 205 (H) 70 - 99 mg/dL  Glucose, capillary     Status: Abnormal   Collection Time: 02/13/19  4:03 PM  Result Value Ref Range   Glucose-Capillary 154 (H) 70 - 99 mg/dL  Glucose, capillary      Status: Abnormal   Collection Time: 02/13/19  9:10 PM  Result Value Ref Range   Glucose-Capillary 161 (H) 70 - 99 mg/dL   Comment 1 Notify RN    Comment 2 Document in Chart   Valproic acid level     Status: None   Collection Time: 02/14/19  6:45 AM  Result Value Ref Range   Valproic Acid Lvl 52 50.0 - 100.0 ug/mL    Comment: Performed at [facility], [address] [place], [place] [address]  Glucose, capillary     Status: Abnormal   Collection Time: 02/14/19  8:13 AM  Result Value Ref Range   Glucose-Capillary 160 (H) 70 - 99 mg/dL  Glucose, capillary     Status: Abnormal   Collection Time: 02/14/19 11:18 AM  Result Value Ref Range   Glucose-Capillary 166 (H) 70 - 99 mg/dL  Glucose, capillary     Status: Abnormal   Collection Time: 02/14/19  3:50 PM  Result Value Ref Range   Glucose-Capillary 150 (H) 70 - 99 mg/dL  Glucose, capillary     Status: Abnormal   Collection Time: 02/14/19  9:21 PM  Result Value Ref Range   Glucose-Capillary 161 (H) 70 - 99 mg/dL    Recent Results (from the past 240 hour(s))  Novel Coronavirus, NAA (Hosp order, Send-out to Ref Lab; TAT 18-24 hrs  Status: None   Collection Time: 02/11/19  1:40 PM   Specimen: Nasopharyngeal Swab; Respiratory  Result Value Ref Range Status   SARS-CoV-2, NAA NOT DETECTED NOT DETECTED Final    Comment: (NOTE) This test was developed and its performance characteristics determined by [employer]. This test has not been FDA cleared or approved. This test has been authorized by FDA under an Emergency Use Authorization (EUA). This test is only authorized for the duration of time the declaration that circumstances exist justifying the authorization of the emergency use of in vitro diagnostic tests for detection of SARS-CoV-2 virus and/or diagnosis of COVID-19 infection under section 564(b)(1) of the Act, 21 U.S.C. [id number]), unless the authorization is terminated or revoked sooner. When diagnostic  testing is negative, the possibility of a false negative result should be considered in the context of a patient's recent exposures and the presence of clinical signs and symptoms consistent with COVID-19. An individual without symptoms of COVID-19 and who is not shedding SARS-CoV-2 virus would expect to have a negative (not detected) result in this assay. Performed  At: [facility] [address] [place], [place] [id number] Rush Farmer MD [id number]    [place]  Final    Comment: Performed at [facility], [address] [place], [place] [address]  MRSA PCR Screening     Status: None   Collection Time: 02/13/19 12:23 AM   Specimen: Nasal Mucosa; Nasopharyngeal  Result Value Ref Range Status   MRSA by PCR NEGATIVE NEGATIVE Final    Comment:        The GeneXpert MRSA Assay (FDA approved for NASAL specimens only), is one component of a comprehensive MRSA colonization surveillance program. It is not intended to diagnose MRSA infection nor to guide or monitor treatment for MRSA infections. Performed at [facility], [place] [address] [place], [place] [address]     Lipid Panel No results for input(s): CHOL, TRIG, HDL, CHOLHDL, VLDL, LDLCALC in the last 72 hours.  Studies/Results: No results found.  Medications:  Scheduled: . clonazepam  1 mg Oral TID  . dexamethasone (DECADRON) injection  4 mg Intravenous Q6H  . insulin aspart  0-5 Units Subcutaneous QHS  . insulin aspart  0-9 Units Subcutaneous TID WC   Continuous: . lacosamide (VIMPAT) IV    . levETIRAcetam 1,500 mg (02/14/19 2204)  . valproate sodium 750 mg (02/15/19 0611)   LTM EEG preliminary report (8/30): No significant change from prior.   Assessment: 1. Exam reveals somnolence with non-rhythmic LUE jerking that is continuous. The jerking is lower in amplitude and less frequent than yesterday. Left hemiparesis is also noted.  2. 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  3. EEG tracings yesterday (8/29) were suggestive of epileptogenicity in right posterior quadrant as well as cortical dysfunction due to underlying structural abnormality in the same region.Left arm jerking was recorded without clear EEG changes.However, focal seizures without alteration of awareness can sometimes not be seen on surface EEG.  3. Valproic acid level is 101, slightly supratherapeutic. Aammonia level is 37, slightly elevated.    Recommendations: 1. Continue LTM EEG 2. Continue Decadron 3. Continue Keppra at 1500 mg BID and Vimpat at 100 mg BID 4. Will hold off on increasing Klonopin due to somnolence.  5. Decreasing valproic acid dosing to [id number]. Pharmacy assistance appreciated.  6. Due to continued LUE jerking, will start Dilantin with 20 mg/kg IV load followed by 100 mg IV TID.  7. Neurooncology Dr.  Vaslow will need to be consulted on Monday AM. My feeling is that risks of biopsy and subsequent treatment of probable GBM would outweigh benefits, but would like Dr. Renda Rolls opinion.    LOS: 3 days   @Electronically  signed: Dr. Kerney Elbe 02/15/2019  7:39 AM

## 2019-02-15 NOTE — Procedures (Signed)
Patient Name:Darrell Clark [id number] Epilepsy Attending:Allante Beane Barbra Sarks Referring Physician/Provider:Dr Dana Allan Duration: 02/13/2019 2309 to 02/14/2019 2309  [name] with seizure like activity. EEG to evaluate for seizure.  Level of alertness:awake, asleep  AEDs during EEG study:VPA, LEV, clonazepam  Technical aspects: This EEG study was done with scalp electrodes positioned according to the 10-20 International system of electrode placement. Electrical activity was acquired at a sampling rate of 500Hz  and reviewed with a high frequency filter of 70Hz  and a low frequency filter of 1Hz . EEG data were recorded continuously and digitally stored.  DESCRIPTION:EEG showed continuous generalized 3 to 6 Hz theta delta slowing, maximal in right posterior quadrant.There were alsosharp waves in right posterior quadrant.Left arm rhythmic jerking was noted Concomitant EEG did not show any seizure. Hyperventilation and photic stimulation were not performed.  IMPRESSION: This study issuggestive of epileptogenicity in right posterior quadrant as well as cortical dysfunction due to underlying structural abnormality in the same region. Left arm jerking was recorded without clear EEG changes.However, focal seizures without alteration of awareness can sometimes not be seen on surface EEG. Therefore, clinical correlation is recommended.   EEG is similar to previous study.

## 2019-02-15 NOTE — Plan of Care (Addendum)
Mr. Fadeley remains in neuro progressive care and is resting comfortably in bed.  Mr. Orrell is somnolent and is minimally responsive.  MD notified earlier yesterday evening to touch base on neuro exam and to express concern with Mr. Chartrand lack of airway protection.  MD at bedside for assessment.  I updated Mr. Gershon' son yesterday evening and he was thankful for the concern and the update.  At this time, Mr. Ballard seems to be obstructing his airway, however he is pulling adequate volume to maintain a normal spo2 on room air.  No acute events overnight. Valproate added.  See flowsheets for assessment details and MAR for medication administration.  Will continue to monitor.      Problem: Clinical Measurements: Goal: Will remain free from infection Outcome: Progressing Goal: Cardiovascular complication will be avoided Outcome: Progressing   Problem: Coping: Goal: Level of anxiety will decrease Outcome: Progressing   Problem: Elimination: Goal: Will not experience complications related to bowel motility Outcome: Progressing Goal: Will not experience complications related to urinary retention Outcome: Progressing   Problem: Pain Managment: Goal: General experience of comfort will improve Outcome: Progressing   Problem: Safety: Goal: Ability to remain free from injury will improve Outcome: Progressing   Problem: Clinical Measurements: Goal: Will remain free from infection Outcome: Progressing   Problem: Coping: Goal: Level of anxiety will decrease Outcome: Progressing   Problem: Elimination: Goal: Will not experience complications related to bowel motility Outcome: Progressing Goal: Will not experience complications related to urinary retention Outcome: Progressing   Problem: Pain Managment: Goal: General experience of comfort will improve Outcome: Progressing   Problem: Safety: Goal: Ability to remain free from injury will improve Outcome: Progressing   Problem:  Education: Goal: Knowledge of General Education information will improve Description: Including pain rating scale, medication(s)/side effects and non-pharmacologic comfort measures Outcome: Not Progressing   Problem: Health Behavior/Discharge Planning: Goal: Ability to manage health-related needs will improve Outcome: Not Progressing   Problem: Clinical Measurements: Goal: Ability to maintain clinical measurements within normal limits will improve Outcome: Not Progressing Goal: Diagnostic test results will improve Outcome: Not Progressing Goal: Respiratory complications will improve Outcome: Not Progressing   Problem: Activity: Goal: Risk for activity intolerance will decrease Outcome: Not Progressing   Problem: Nutrition: Goal: Adequate nutrition will be maintained Outcome: Not Progressing   Problem: Skin Integrity: Goal: Risk for impaired skin integrity will decrease Outcome: Not Progressing   Problem: Education: Goal: Knowledge of General Education information will improve Description: Including pain rating scale, medication(s)/side effects and non-pharmacologic comfort measures Outcome: Not Progressing   Problem: Health Behavior/Discharge Planning: Goal: Ability to manage health-related needs will improve Outcome: Not Progressing   Problem: Clinical Measurements: Goal: Ability to maintain clinical measurements within normal limits will improve Outcome: Not Progressing Goal: Diagnostic test results will improve Outcome: Not Progressing Goal: Respiratory complications will improve Outcome: Not Progressing Goal: Cardiovascular complication will be avoided Outcome: Not Progressing   Problem: Activity: Goal: Risk for activity intolerance will decrease Outcome: Not Progressing   Problem: Nutrition: Goal: Adequate nutrition will be maintained Outcome: Not Progressing   Problem: Clinical Measurements: Goal: Cardiovascular complication will be avoided Outcome:  Progressing   Problem: Skin Integrity: Goal: Risk for impaired skin integrity will decrease Outcome: Not Progressing

## 2019-02-16 DIAGNOSIS — Z87891 Personal history of nicotine dependence: Secondary | ICD-10-CM

## 2019-02-16 DIAGNOSIS — C719 Malignant neoplasm of brain, unspecified: Principal | ICD-10-CM

## 2019-02-16 DIAGNOSIS — I10 Essential (primary) hypertension: Secondary | ICD-10-CM

## 2019-02-16 DIAGNOSIS — E119 Type 2 diabetes mellitus without complications: Secondary | ICD-10-CM

## 2019-02-16 DIAGNOSIS — Z79899 Other long term (current) drug therapy: Secondary | ICD-10-CM

## 2019-02-16 DIAGNOSIS — G40109 Localization-related (focal) (partial) symptomatic epilepsy and epileptic syndromes with simple partial seizures, not intractable, without status epilepticus: Secondary | ICD-10-CM

## 2019-02-16 DIAGNOSIS — Z923 Personal history of irradiation: Secondary | ICD-10-CM

## 2019-02-16 DIAGNOSIS — Z794 Long term (current) use of insulin: Secondary | ICD-10-CM

## 2019-02-16 DIAGNOSIS — Z8546 Personal history of malignant neoplasm of prostate: Secondary | ICD-10-CM

## 2019-02-16 LAB — BASIC METABOLIC PANEL
Anion gap: 16 — ABNORMAL HIGH (ref 5–15)
BUN: 43 mg/dL — ABNORMAL HIGH (ref 8–23)
CO2: 22 mmol/L (ref 22–32)
Calcium: 9 mg/dL (ref 8.9–10.3)
Chloride: 104 mmol/L (ref 98–111)
Creatinine, Ser: 1.26 mg/dL — ABNORMAL HIGH (ref 0.61–1.24)
GFR calc Af Amer: >60 mL/min (ref >60)
GFR calc non Af Amer: 52 mL/min — ABNORMAL LOW (ref >60)
Glucose, Bld: 206 mg/dL — ABNORMAL HIGH (ref 70–99)
Potassium: 4.7 mmol/L (ref 3.5–5.1)
Sodium: 142 mmol/L (ref 135–145)

## 2019-02-16 LAB — GLUCOSE, CAPILLARY
Glucose-Capillary: 185 mg/dL — ABNORMAL HIGH (ref 70–99)
Glucose-Capillary: 161 mg/dL — ABNORMAL HIGH (ref 70–99)
Glucose-Capillary: 166 mg/dL — ABNORMAL HIGH (ref 70–99)
Glucose-Capillary: 185 mg/dL — ABNORMAL HIGH (ref 70–99)

## 2019-02-16 LAB — PHOSPHORUS: Phosphorus: 4.6 mg/dL (ref 2.5–4.6)

## 2019-02-16 LAB — CBC
HCT: 56.2 % — ABNORMAL HIGH (ref 39.0–52.0)
Hemoglobin: 18.2 g/dL — ABNORMAL HIGH (ref 13.0–17.0)
MCH: 31 pg (ref 26.0–34.0)
MCHC: 32.4 g/dL (ref 30.0–36.0)
MCV: 95.7 fL (ref 80.0–100.0)
Platelets: 179 10*3/uL (ref 150–400)
RBC: 5.87 MIL/uL — ABNORMAL HIGH (ref 4.22–5.81)
RDW: 14.2 % (ref 11.5–15.5)
WBC: 10 10*3/uL (ref 4.0–10.5)
nRBC: 0 % (ref 0.0–0.2)

## 2019-02-16 LAB — MAGNESIUM: Magnesium: 2.8 mg/dL — ABNORMAL HIGH (ref 1.7–2.4)

## 2019-02-16 LAB — PHENYTOIN LEVEL, TOTAL: Phenytoin Lvl: 15.2 ug/mL (ref 10.0–20.0)

## 2019-02-16 LAB — VALPROIC ACID LEVEL: Valproic Acid Lvl: 83 ug/mL (ref 50.0–100.0)

## 2019-02-16 LAB — AMMONIA: Ammonia: 24 umol/L (ref 9–35)

## 2019-02-16 MED ORDER — LORAZEPAM 2 MG/ML IJ SOLN: 1.0000 mg | INTRAMUSCULAR | Status: DC | PRN

## 2019-02-16 MED ORDER — LACTATED RINGERS IV SOLN
INTRAVENOUS | Status: AC
  Administered 2019-02-16: 07:00:00 via INTRAVENOUS

## 2019-02-16 MED ORDER — CLONAZEPAM 0.25 MG PO TBDP: 0.5000 mg | ORAL_TABLET | Freq: Three times a day (TID) | ORAL | Status: DC

## 2019-02-16 MED ORDER — CLONAZEPAM 0.25 MG PO TBDP: 0.5000 mg | ORAL_TABLET | Freq: Two times a day (BID) | ORAL | Status: DC | PRN

## 2019-02-16 NOTE — Progress Notes (Signed)
PROGRESS NOTE  Darrell Clark [id number] DOB: Sep 24, 1932 DOA: 02/08/2019 PCP: Maryland Pink, MD  HPI/Recap of past 12 hours: 85 year old male with history of dementia, CHF, CAD, PAD, DM-2, HTN, HLD, prostate cancer and obesity who presented to [facility] on 8/26 with seizure and transferred to: 8/27 for EEG.  Patient was loaded with Depakote 20 mg/KG and continued on 50 mg/KG/day in 3 divided doses at [facility].  CT head w/o contrast at [facility] revealed interval development of ill-defined right-sided subcortical hypodensities concerning for infarction or edema.  MRI brain W/O contrast revealed cortical subcortical edema within right occipital lobe at the right frontoparietal junction concerning for subacute infarction or neoplastic process.  MRI brain W/contrast showed moderate area of enhancement in the right occipital lobe and small areas of enhancement in the right frontoparietal white matter favoring mass especially GBM and a subacute right PCA finding concerning for mass versus infarct.  On arrival here, evaluated by neurology.  Stat EEG and LTM EEG were ordered by neurology.  He was loaded with Keppra 2000 mg IV once.  Continued on Depakote 500 mg 3 times daily, Keppra 500 mg twice daily, and Klonopin 0.25 mg 3 times daily.  He was also started on dexamethasone 4 mg every 6 hours IV.  Patient's EEG revealed epileptiform discharge in the right posterior quadrant.  He is currently on LTM EEG.  02/16/19: Patient was seen and examined at his bedside this morning. Lethargic. Does not follow commands.  Assessment/Plan: Active Problems:   Seizure ([place])  New onset seizure likely secondary to brain mass possibly GBM  Reviewed MRI done on admission which showed R posterior brain mass Seen by neurology, antiepileptic medication adjusted, recommendations for neurooncology evaluation possibly tomorrow Ongoing continuous EEG Continue close monitoring with seizure precaution Continue Decadron, Keppra  1500 mg twice daily, Vimpat at 100 mg twice daily Klonopin being held due to somnolence Elevated valproic acid 101>>Valproic acid dose decreased to 500 mg daily. Ammonia level mildly elevated 37 C/w IV Dilantin load 100 mg 3 times daily   Persistent acute metabolic encephalopathy most likely multifactorial secondary to brain mass with suspicion for GBM versus acute illness in the setting of dementia Treat underlying conditions Neuro oncology consulted Dr. Mickeal Skinner will see the patient. Monitor electrolytes Obtain BMP, mg2+ and phos in the AM  Acute dehydration in the setting of persistent acute metabolic encephalopathy Has not been able to take in po due to somnolence Start gentle IV fluid hydration LR at 50cc/hr Closely monitor volume status and urine output  Type 2 diabetes with steroid-induced hyperglycemia Hemoglobin A1c 6.5 on 01/19/2019 Continue insulin sliding scale Avoid hypoglycemia  HTN: Normotensive. -PRN labetalol -Resume home meds when able.  History of CHF/CAD/PAD: Stable no cardiopulmonary issues. -Resume home meds when able  History of prostate cancer s/p status post radiation in 2011 in remission. -Outpatient follow-up with urologist  DVT prophylaxis: SCD Code Status: Full code Family Communication: Patient and/or RN. Available if any question. Disposition Plan: Remains inpatient Consultants: Neurology  Procedures:  8/27-EEG-epileptiform discharge from posterior quadrant 8/27-LVM EEG ongoing.  Microbiology summarized: None    Objective: Vitals:   02/15/19 2023 02/15/19 2335 02/16/19 0431 02/16/19 0751  BP: 117/86 (!) 144/93 122/79 140/86  Pulse: 66 63 62 76  Resp: (!) 22 17 (!) 21 18  Temp: 98.2 F (36.8 C) (!) 97.5 F (36.4 C) 98.1 F (36.7 C) 98.1 F (36.7 C)  TempSrc: Axillary Axillary Axillary Oral  SpO2: 100% 99% 95% 98%    Intake/Output  Summary (Last 24 hours) at 02/16/2019 1243 Last data filed at 02/16/2019 1100 Gross per 24  hour  Intake 1012.61 ml  Output 1150 ml  Net -137.39 ml   There were no vitals filed for this visit.  Exam:  . General: 85 y.o. year-old male WD WN Lethargic. Does not follow commands. . Cardiovascular: RRR no rubs or gallops. No JVD or thyromegaly  . Respiratory: CTA no wheezes or rales. Poor inspiratory efforts. . Abdomen:Obese NT ND Hypoactive Bowel sounds . Musculoskeletal: No LE edema. 2/4 pulses in all 4 extremities. Marland Kitchen Psychiatry: Unable to assess mood due to lethargy.  Data Reviewed: CBC: Recent Labs  Lab 02/11/19 1252 02/01/2019 0540 02/13/19 0101 02/16/19 0727  WBC 11.3* 12.2* 8.7 10.0  NEUTROABS  --   --  7.7  --   HGB 15.5 15.5 16.3 18.2*  HCT 47.3 47.8 48.7 56.2*  MCV 94.6 95.2 93.1 95.7  PLT 168 185 194 0000000   Basic Metabolic Panel: Recent Labs  Lab 02/11/19 1252 02/07/2019 0540 02/13/19 0101 02/13/19 0656 02/16/19 0727  NA 142 140 136 136 142  K 4.1 5.9* 4.5 4.4 4.7  CL 103 101 100 101 104  CO2 24 26 22  21* 22  GLUCOSE 189* 151* 173* 177* 206*  BUN 14 17 20 21  43*  CREATININE 1.15 1.07 1.08 1.06 1.26*  CALCIUM 9.1 9.7 9.7 9.8 9.0  MG  --   --  2.0  --  2.8*  PHOS  --   --  3.8  --  4.6   GFR: Estimated Creatinine Clearance: 53.5 mL/min (A) (by C-G formula based on SCr of 1.26 mg/dL (H)). Liver Function Tests: Recent Labs  Lab 02/11/19 1252 02/13/19 0101 02/13/19 0656  AST 28  --  64*  ALT 16  --  26  ALKPHOS 76  --  71  BILITOT 0.7  --  1.2  PROT 7.3  --  7.3  ALBUMIN 4.0 3.6 3.7   No results for input(s): LIPASE, AMYLASE in the last 168 hours. Recent Labs  Lab 02/15/19 0657  AMMONIA 37*   Coagulation Profile: No results for input(s): INR, PROTIME in the last 168 hours. Cardiac Enzymes: No results for input(s): CKTOTAL, CKMB, CKMBINDEX, TROPONINI in the last 168 hours. BNP (last 3 results) No results for input(s): PROBNP in the last 8760 hours. HbA1C: No results for input(s): HGBA1C in the last 72 hours. CBG: Recent Labs  Lab  02/15/19 0815 02/15/19 1242 02/15/19 1606 02/15/19 2140 02/16/19 0749  GLUCAP 179* 197* 224* 165* 185*   Lipid Profile: No results for input(s): CHOL, HDL, LDLCALC, TRIG, CHOLHDL, LDLDIRECT in the last 72 hours. Thyroid Function Tests: No results for input(s): TSH, T4TOTAL, FREET4, T3FREE, THYROIDAB in the last 72 hours. Anemia Panel: No results for input(s): VITAMINB12, FOLATE, FERRITIN, TIBC, IRON, RETICCTPCT in the last 72 hours. Urine analysis:    Component Value Date/Time   COLORURINE YELLOW (A) 02/11/2019 1340   APPEARANCEUR CLEAR (A) 02/11/2019 1340   APPEARANCEUR Clear 01/19/2013 1937   LABSPEC 1.019 02/11/2019 1340   LABSPEC 1.017 01/19/2013 1937   PHURINE 5.0 02/11/2019 1340   GLUCOSEU NEGATIVE 02/11/2019 1340   GLUCOSEU Negative 01/19/2013 1937   HGBUR NEGATIVE 02/11/2019 1340   BILIRUBINUR NEGATIVE 02/11/2019 1340   BILIRUBINUR Negative 01/19/2013 1937   KETONESUR 5 (A) 02/11/2019 1340   PROTEINUR NEGATIVE 02/11/2019 1340   NITRITE NEGATIVE 02/11/2019 1340   LEUKOCYTESUR NEGATIVE 02/11/2019 1340   LEUKOCYTESUR Negative 01/19/2013 1937   Sepsis Labs: @  LABRCNTIP(procalcitonin:4,lacticidven:4)  ) Recent Results (from the past 240 hour(s))  Novel Coronavirus, NAA (Hosp order, Send-out to Ref Lab; TAT 18-24 hrs     Status: None   Collection Time: 02/11/19  1:40 PM   Specimen: Nasopharyngeal Swab; Respiratory  Result Value Ref Range Status   SARS-CoV-2, NAA NOT DETECTED NOT DETECTED Final    Comment: (NOTE) This test was developed and its performance characteristics determined by [employer]. This test has not been FDA cleared or approved. This test has been authorized by FDA under an Emergency Use Authorization (EUA). This test is only authorized for the duration of time the declaration that circumstances exist justifying the authorization of the emergency use of in vitro diagnostic tests for detection of SARS-CoV-2 virus and/or diagnosis of  COVID-19 infection under section 564(b)(1) of the Act, 21 U.S.C. [id number]), unless the authorization is terminated or revoked sooner. When diagnostic testing is negative, the possibility of a false negative result should be considered in the context of a patient's recent exposures and the presence of clinical signs and symptoms consistent with COVID-19. An individual without symptoms of COVID-19 and who is not shedding SARS-CoV-2 virus would expect to have a negative (not detected) result in this assay. Performed  At: [facility] [address] [place], [place] [id number] Rush Farmer MD [id number]    [place]  Final    Comment: Performed at [facility], [address] [place], [place] [address]  MRSA PCR Screening     Status: None   Collection Time: 02/13/19 12:23 AM   Specimen: Nasal Mucosa; Nasopharyngeal  Result Value Ref Range Status   MRSA by PCR NEGATIVE NEGATIVE Final    Comment:        The GeneXpert MRSA Assay (FDA approved for NASAL specimens only), is one component of a comprehensive MRSA colonization surveillance program. It is not intended to diagnose MRSA infection nor to guide or monitor treatment for MRSA infections. Performed at [facility], [place] [address], [place], [place] [address]       Studies: No results found.  Scheduled Meds: . dexamethasone (DECADRON) injection  4 mg Intravenous Q6H  . insulin aspart  0-5 Units Subcutaneous QHS  . insulin aspart  0-9 Units Subcutaneous TID WC  . phenytoin (DILANTIN) IV  100 mg Intravenous Q8H    Continuous Infusions: . lacosamide (VIMPAT) IV 100 mg (02/16/19 1040)  . lactated ringers 50 mL/hr at 02/16/19 0658  . levETIRAcetam 1,500 mg (02/16/19 0924)  . valproate sodium 500 mg (02/15/19 1526)  . valproate sodium 750 mg (02/16/19 0605)     LOS: 4 days     Kayleen Memos, MD [facility] Pager [phone]  If 7PM-7AM, please contact  night-coverage [email or web address] Password [facility] 02/16/2019, 12:43 PM

## 2019-02-16 NOTE — Progress Notes (Addendum)
Reason for consult: Seizure  Subjective: Patient is not having left-sided arm jerking anymore.  Patient was very drowsy and not answering questions this morning.  I spoke with the bedside nurse who stated that she has not noticed any left-sided jerking this morning.  No other concerns.  ROS: Unable to obtain due to poor mental status  Examination  Vital signs in last 24 hours: Temp:  [97.5 F (36.4 C)-98.2 F (36.8 C)] 98.1 F (36.7 C) (08/31 0751) Pulse Rate:  [62-81] 76 (08/31 0751) Resp:  [17-22] 18 (08/31 0751) BP: (117-144)/(65-93) 140/86 (08/31 0751) SpO2:  [95 %-100 %] 98 % (08/31 0751)  General: lying in bed, not in apparent distress CVS: pulse-normal rate and rhythm RS: breathing comfortably  Extremities: normal, warm  Neuro: MS: Opens eyes to repeated tactile stimuli, does not follow commands, tracks examiner in the room briefly CN: pupils equal and reactive,  EOMI, face symmetric, + cough reflex, no apparent facial asymmetry Motor: Left upper and lower extremity flaccid with brisk reflexes, right upper and lower extremity with hypotonia and withdrawal to noxious stimuli  Basic Metabolic Panel: Recent Labs  Lab 02/11/19 1252 01/31/2019 0540 02/13/19 0101 02/13/19 0656 02/16/19 0727  NA 142 140 136 136 142  K 4.1 5.9* 4.5 4.4 4.7  CL 103 101 100 101 104  CO2 24 26 22  21* 22  GLUCOSE 189* 151* 173* 177* 206*  BUN 14 17 20 21  43*  CREATININE 1.15 1.07 1.08 1.06 1.26*  CALCIUM 9.1 9.7 9.7 9.8 9.0  MG  --   --  2.0  --  2.8*  PHOS  --   --  3.8  --  4.6    CBC: Recent Labs  Lab 02/11/19 1252 01/27/2019 0540 02/13/19 0101 02/16/19 0727  WBC 11.3* 12.2* 8.7 10.0  NEUTROABS  --   --  7.7  --   HGB 15.5 15.5 16.3 18.2*  HCT 47.3 47.8 48.7 56.2*  MCV 94.6 95.2 93.1 95.7  PLT 168 185 194 179     Coagulation Studies: No results for input(s): LABPROT, INR in the last 72 hours.  Imaging MRI brain with and without contrast: Moderate area of enhancement in  the right occipital lobe and small areas of enhancement in the right frontal parietal white matter which is less extensive than the extent of T2 signal. A mass is favored, especially glioblastoma. Subacute right PCA infarct with watershed ischemia in the right cerebral white matter is a ifferential consideration, but would not expect the necrotic enhancement pattern at the level of the occipital lobe   ASSESSMENT AND PLAN 85 year old man with above past medical history presented with left side jerking. MRI concerning for GBM in the right occipital lobe plus/minus stroke in that area as well. Patient was in focal status epilepticus which has since resolved.   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 Keppra 1500mg  BID  2. VPA 750-500-750mg  Q8hours. Will check levels today and titrate if needed  3. Vimpat 100mg  BID  4. Phenytoin 100mg  TID: will check levels and titrate as needed - Patient is encephalopathic likely secondary to being on multiple AEDs sec to GBM - Anticipate DC LTM tomorrow morning if patient doesn't have any subclinical seizures on this current AED regimen - Now that his status epilepticus has resolved, Neurooncology Dr. Mickeal Clark can be consulted for further management of  possible tumor.  -  Continue Dexamethasone75milligrams every 6 hours IV for now. Will defer further management to neuro-oncology -Continue seizure precautions -PRN IV Ativan 2 mg for generalized tonic-clonic seizure lasting more than 2 minutes   I have spent a total of 35 minutes with the patient and family (son) reviewing hospital notes,  test results, labs and examining the patient as well as establishing an assessment and plan that was discussed personally with the patient.  > 50% of time was spent in direct patient care.

## 2019-02-16 NOTE — Plan of Care (Signed)
  Problem: Clinical Measurements: Goal: Diagnostic test results will improve Outcome: Progressing Long term video EEG monitoring continues   Problem: Clinical Measurements: Goal: Ability to maintain clinical measurements within normal limits will improve Outcome: Progressing Vital signs stable. Patient arouses only to verbal and tactile stimuli  Goal: Respiratory complications will improve Outcome: Progressing In no acute respiratory distress during shift

## 2019-02-16 NOTE — Consult Note (Signed)
[facility] Consult Note  Patient Care Team: [place] Pink, MD as PCP - General (Family Medicine)  CHIEF COMPLAINTS/PURPOSE OF CONSULTATION:  Brain Tumor Status Epilepticus  HISTORY OF PRESENTING ILLNESS:  Darrell Clark 85 y.o. male presented to medical attention at [facility] this past week with onset of sudden and prolonged seizure activity, described as left side shaking and confusion.  He was transferred to [facility] for long term video EEG monitoring.  CNS imaging demonstrated abnormality c/w brain malignancy or territorial infarct.  Since admission his seizures have resolved clinically, but his motor function and mental status are very impaired and only improving modestly.  MEDICAL HISTORY:  Past Medical History:  Diagnosis Date  . Acquired cyst of kidney   . Allergy   . Anxiety   . Arthritis   . Atherosclerosis   . BPH (benign prostatic hyperplasia)   . CAD (coronary artery disease)   . Cardiac arrhythmia   . CHF (congestive heart failure) ([place])   . Chronic prostatitis   . Degenerative arthritis   . Dementia ([place])    mild  . Depression   . Diabetes mellitus without complication ([place])   . Dyspnea    with exertion  . Elevated prostate specific antigen (PSA)   . Encysted hydrocele   . GERD (gastroesophageal reflux disease)   . Glaucoma (increased eye pressure)   . Gout   . Gross hematuria   . Hemorrhoids   . HLD (hyperlipidemia)   . Hyperlipidemia, unspecified   . Hypertension   . Hypertrophy of breast   . Impotence of organic origin   . Incomplete bladder emptying   . Malignant neoplasm of prostate ([place])   . Osteoarthritis   . Other specified disorders of the male genital organs   . Prostate cancer ([place])   . PVD (peripheral vascular disease) ([place])   . Sciatica   . Spermatocele   . Spinal stenosis of lumbar region with radiculopathy 11/11/2015  . Stroke ([place])   . Testicular hypofunction    other  . Urinary frequency   .  Urinary obstruction    not elsewhere classified  . Vertigo     SURGICAL HISTORY: Past Surgical History:  Procedure Laterality Date  . APPENDECTOMY    . CARDIAC CATHETERIZATION  11/2013  . FIDUCIAL SEED PLACEMENT  10/14/2009  . PROSTATE BIOPSY  08/11/2009  . RADIATION FOR PROSTATE CANCER    . TOTAL HIP ARTHROPLASTY Left 05/06/2017   Procedure: TOTAL HIP ARTHROPLASTY;  Surgeon: Dereck Leep, MD;  Location: [facility] ORS;  Service: Orthopedics;  Laterality: Left;    SOCIAL HISTORY: Social History   Socioeconomic History  . Marital status: Widowed    Spouse name: Not on file  . Number of children: 2  . Years of education: 12+  . Highest education level: Some college, no degree  Occupational History  . Not on file  Social Needs  . Financial resource strain: Not on file  . Food insecurity    Worry: Not on file    Inability: Not on file  . Transportation needs    Medical: Not on file    Non-medical: Not on file  Tobacco Use  . Smoking status: Former Smoker    Packs/day: 1.00    Years: 50.00    Pack years: 50.00    Types: Cigarettes    Quit date: 11/19/2007    Years since quitting: 11.2  . Smokeless tobacco: Never Used  Substance and Sexual Activity  .  Alcohol use: No  . Drug use: No  . Sexual activity: Not Currently  Lifestyle  . Physical activity    Days per week: Not on file    Minutes per session: Not on file  . Stress: Not on file  Relationships  . Social Herbalist on phone: Not on file    Gets together: Not on file    Attends religious service: Not on file    Active member of club or organization: Not on file    Attends meetings of clubs or organizations: Not on file    Relationship status: Not on file  . Intimate partner violence    Fear of current or ex partner: Not on file    Emotionally abused: Not on file    Physically abused: Not on file    Forced sexual activity: Not on file  Other Topics Concern  . Not on file  Social History Narrative   . Not on file    FAMILY HISTORY: Family History  Problem Relation Age of Onset  . Heart attack Mother   . Hypertension Mother   . Heart attack Father   . Hypertension Father   . Hypertension Sister   . Cancer Brother   . Hypertension Brother   . Hypertension Brother   . Hypertension Brother   . Hypertension Sister   . Kidney cancer Neg Hx   . Prostate cancer Neg Hx   . GU problems Neg Hx     ALLERGIES:  is allergic to niacin and niacin and related.  MEDICATIONS:  Current Facility-Administered Medications  Medication Dose Route Frequency Provider Last Rate Last Dose  . clonazePAM (KLONOPIN) disintegrating tablet 0.5 mg  0.5 mg Oral BID PRN Lora Havens, MD      . dexamethasone (DECADRON) injection 4 mg  4 mg Intravenous Q6H Dana Allan I, MD   4 mg at 02/16/19 1222  . insulin aspart (novoLOG) injection 0-5 Units  0-5 Units Subcutaneous QHS Dana Allan I, MD      . insulin aspart (novoLOG) injection 0-9 Units  0-9 Units Subcutaneous TID WC Dana Allan I, MD   2 Units at 02/16/19 1313  . labetalol (NORMODYNE) injection 10 mg  10 mg Intravenous Q2H PRN Wendee Beavers T, MD      . lacosamide (VIMPAT) 100 mg in sodium chloride 0.9 % 25 mL IVPB  100 mg Intravenous Q12H Kerney Elbe, MD 70 mL/hr at 02/16/19 1040 100 mg at 02/16/19 1040  . lactated ringers infusion   Intravenous Continuous Kayleen Memos, DO 50 mL/hr at 02/16/19 [id number]    . levETIRAcetam (KEPPRA) IVPB 1500 mg/ 100 mL premix  1,500 mg Intravenous Q12H Lora Havens, MD 400 mL/hr at 02/16/19 0924 1,500 mg at 02/16/19 0924  . LORazepam (ATIVAN) injection 1 mg  1 mg Intravenous Q4H PRN Lora Havens, MD      . phenytoin (DILANTIN) injection 100 mg  100 mg Intravenous Q8H Kerney Elbe, MD   100 mg at 02/16/19 1552  . valproate (DEPACON) 500 mg in dextrose 5 % 50 mL IVPB  500 mg Intravenous Daily Kerney Elbe, MD 55 mL/hr at 02/16/19 1318 500 mg at 02/16/19 1318  . valproate (DEPACON) 750 mg in  dextrose 5 % 50 mL IVPB  750 mg Intravenous BID Kerney Elbe, MD 57.5 mL/hr at 02/16/19 0605 750 mg at 02/16/19 [id number]    REVIEW OF SYSTEMS:   UTO- AMS   PHYSICAL EXAMINATION: Vitals:  02/16/19 1259 02/16/19 1618  BP: (!) 139/110 (!) 144/85  Pulse: 74 71  Resp:    Temp: 98.4 F (36.9 C) 98 F (36.7 C)  SpO2: 96%    KPS: 40. General: In bed, not awake without stimulation Head: Normal EENT: Adentulous Lungs: Resp effort increased Cardiac: Regular rate and rhythm Abdomen: Soft, non-distended abdomen Skin: No rashes cyanosis or petechiae. Extremities: No clubbing or edema  NEUROLOGIC EXAM: Mental Status: Arouses to loud verbal and tactile stimulus.  Not able to maintain alertness or attend to examiner.   Cranial Nerves: Symmetric grimace and eye movt's intact to threat Motor: With stimulation withdraws left side only. Reflexes are symmetric, no pathologic reflexes present.  Gait: non ambulatory  LABORATORY DATA:  I have reviewed the data as listed Lab Results  Component Value Date   WBC 10.0 02/16/2019   HGB 18.2 (H) 02/16/2019   HCT 56.2 (H) 02/16/2019   MCV 95.7 02/16/2019   PLT 179 02/16/2019   Recent Labs    07/12/18 1339 02/11/19 1252  02/13/19 0101 02/13/19 0656 02/16/19 0727  NA 141 142   < > 136 136 142  K 3.6 4.1   < > 4.5 4.4 4.7  CL 106 103   < > 100 101 104  CO2 28 24   < > 22 21* 22  GLUCOSE 92 189*   < > 173* 177* 206*  BUN 13 14   < > 20 21 43*  CREATININE 1.03 1.15   < > 1.08 1.06 1.26*  CALCIUM 9.3 9.1   < > 9.7 9.8 9.0  GFRNONAA >60 58*   < > >60 >60 52*  GFRAA >60 >60   < > >60 >60 >60  PROT 8.1 7.3  --   --  7.3  --   ALBUMIN 4.3 4.0  --  3.6 3.7  --   AST 22 28  --   --  64*  --   ALT 14 16  --   --  26  --   ALKPHOS 80 76  --   --  71  --   BILITOT 0.9 0.7  --   --  1.2  --    < > = values in this interval not displayed.    RADIOGRAPHIC STUDIES: I have personally reviewed the radiological images as listed and agreed with the  findings in the report. Ct Angio Head W Or Wo Contrast  Result Date: 02/11/2019 CLINICAL DATA:  Seizure-like activity. EXAM: CT ANGIOGRAPHY HEAD TECHNIQUE: Multidetector CT imaging of the head was performed using the standard protocol during bolus administration of intravenous contrast. Multiplanar CT image reconstructions and MIPs were obtained to evaluate the vascular anatomy. CONTRAST:  75mL OMNIPAQUE IOHEXOL 350 MG/ML SOLN COMPARISON:  04/15/2016 FINDINGS: Anterior circulation: The internal carotid arteries are patent from skull base to carotid termini. Calcified plaque results in mild-to-moderate proximal supraclinoid stenosis bilaterally with detailed assessment limited by motion artifact. The MCAs are patent without evidence of proximal branch occlusion or significant M1 stenosis. Focal calcified plaque or a chronic embolus in the proximal left M2 inferior division is unchanged with associated moderate stenosis. Both A1 segments are widely patent. There is unchanged chronic occlusion of the left A2 segment. No aneurysm is identified. Posterior circulation: The visualized distal vertebral arteries are patent to the basilar with the left being dominant. There are severe multifocal right and distal left V4 stenoses which are similar to the prior CTA. The basilar artery is patent with moderate  to severe multifocal stenosis. Patent SCA is are seen bilaterally. They are fetal origins of both PCAs with moderate branch vessel irregularity but no evidence of flow limiting proximal stenosis. No aneurysm is identified. Venous sinuses: Poorly evaluated due to arterial phase contrast timing. Anatomic variants: Fetal PCAs. IMPRESSION: 1. No major intracranial arterial occlusion. 2. 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 3. Chronic left A2 occlusion. Electronically Signed   By: Logan Bores M.D.   On: 02/11/2019 18:26   Ct Head Wo  Contrast  Result Date: 02/11/2019 CLINICAL DATA:  Seizures. EXAM: CT HEAD WITHOUT CONTRAST TECHNIQUE: Contiguous axial images were obtained from the base of the skull through the vertex without intravenous contrast. COMPARISON:  CT scan of June 22, 2018. FINDINGS: Brain: Ventricular size is within normal limits. No midline shift is noted. Interval development of several right-sided subcortical white matter low densities are noted concerning for infarction of indeterminate age or edema due to other pathology. No hemorrhage is noted. Vascular: No hyperdense vessel or unexpected calcification. Skull: Normal. Negative for fracture or focal lesion. Sinuses/Orbits: No acute finding. Other: None. IMPRESSION: Interval development of several ill-defined right sided subcortical white matter low densities concerning for infarction of indeterminate age or edema due to other pathology. MRI is recommended for further evaluation. Electronically Signed   By: Marijo Conception M.D.   On: 02/11/2019 13:31   Mr Brain Wo Contrast  Result Date: 02/11/2019 CLINICAL DATA:  Recent fall.  Tremor. EXAM: MRI HEAD WITHOUT CONTRAST TECHNIQUE: Multiplanar, multiecho pulse sequences of the brain and surrounding structures were obtained without intravenous contrast. COMPARISON:  Head CT 02/11/2019 Brain MRI 07/12/2018 FINDINGS: BRAIN: There is no acute infarct, acute hemorrhage or extra-axial collection. There is a new area hyperintense T2-weighted signal within the right occipital cortex and within the subcortical white matter at the base of the pre and postcentral gyri. The cerebral and cerebellar volume are age-appropriate. There is no hydrocephalus. VASCULAR: The major intracranial arterial and venous sinus flow voids are normal. Susceptibility-sensitive sequences show no chronic microhemorrhage or superficial siderosis. SKULL AND UPPER CERVICAL SPINE: Calvarial bone marrow signal is normal. There is no skull base mass. The visualized  upper cervical spine and soft tissues are normal. SINUSES/ORBITS: There are no fluid levels or advanced mucosal thickening. The mastoid air cells and middle ear cavities are free of fluid. The orbits are normal. IMPRESSION: 1. New areas of cortical/subcortical edema within the right occipital lobe and at the right frontoparietal junction. No associated diffusion restriction. These may indicate areas of subacute infarction. A neoplastic or infectious/inflammatory process could also cause these findings. Further imaging with intravenous contrast administration may be helpful. 2. Chronic small vessel ischemia. Electronically Signed   By: Ulyses Jarred M.D.   On: 02/11/2019 22:53   Mr Brain W Contrast  Result Date: 02/13/2019 CLINICAL DATA:  Subacute neuro deficit EXAM: MRI HEAD WITH CONTRAST TECHNIQUE: Multiplanar, multiecho pulse sequences of the brain and surrounding structures were obtained with intravenous contrast. CONTRAST:  10 cc Gadavist intravenous COMPARISON:  Brain MRI from yesterday and CT CTA from the day before. Brain MRI 07/12/2018 FINDINGS: Moderate area of contiguous enhancement in the right occipital lobe extending from the occipital horn of the ventricle to the cortex. There is central non enhancement at this level. There are aligned patchy areas of white matter enhancement in the right posterior frontal and parietal region. Enhancement is much less extensive than the extent of T2  and FLAIR signal. No other areas of enhancement. IMPRESSION: Moderate area of enhancement in the right occipital lobe and small areas of enhancement in the right frontal parietal white matter which is less extensive than the extent of T2 signal. A mass is favored, especially glioblastoma. Subacute right PCA infarct with watershed ischemia in the right cerebral white matter is a differential consideration, but would not expect the necrotic enhancement pattern at the level of the occipital lobe. Follow-up in 2-3 weeks may  be helpful. Electronically Signed   By: Monte Fantasia M.D.   On: 01/21/2019 11:38    ASSESSMENT & PLAN:  Abnormal Brain MRI Status Epilepticus  Darrell Clark presents with clinical syndrome localizing to right hemisphere, with concurrent global encephalopathy.  Fortunately seizures have resolved but he is requiring 4 AEDs for control.   Based on clinical history and imaging findings, I suspect underlying etiology is neoplasm, likely primary brain/glial.  Enhancement pattern is c/w neoplasm, but does overlap with vascular territories.  In my mind stroke becomes less likely when considering relative absence of restricted diffusion on initial MRI.  This would rule out acute infarct and push timeline of any stroke back several weeks, which seems unlikely based on history.    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  We could consider repeating MRI in 7-10 days and planning biopsy, but only if his post-ictal clinical condition improves considerably.  If not significantly improved, risk of surgery/biopsy would be very high (given age, comorbidity, seizure hx) with little potential benefit for the likeliest outcome.  Expected survival for multifocal glioblastoma in this age/comorbidity group is less than 6 months.  My recommendation at this point would be for palliative care consultation If clinical status improves to awake/talking/walking, repeat MRI brain in ~1 week, then will re-evaluate plan of care   Please feel free to reach out to me for further management considerations or aspects of care relating to possible brain tumor.  The total time spent in the encounter was 55 minutes and more than 50% was on counseling and review of test results     Ventura Sellers, MD 02/16/2019 4:35 PM

## 2019-02-16 NOTE — Progress Notes (Signed)
Daughter called asking for updates on her father. RN updated daughter briefly on condition at this time, vitals stable, pt resting comfortably, no moaning or restlessness noted at this time.

## 2019-02-16 NOTE — Care Management Important Message (Signed)
Important Message  Patient Details  Name: Darrell Clark MRN: [id number] Date of Birth: 02/16/1933   Medicare Important Message Given:  Yes     Memory Argue 02/16/2019, 4:03 PM

## 2019-02-16 NOTE — Progress Notes (Signed)
Son called asking for update on father's condition. Son updated by RN, that pt is resting comfortably, vitals remain stable, right arm movement at times. Pt mumbles at times.

## 2019-02-16 NOTE — Progress Notes (Signed)
NO seizures on EEG so far. Please refer to final report for details.

## 2019-02-16 NOTE — Procedures (Signed)
Darrell Name:Darrell Clark Darrell Clark Epilepsy Attending:Indalecio Malmstrom Barbra Sarks Referring Physician/Provider:Dr Dana Allan Duration: 8/29/20202309 to 02/15/2019 2309  Darrell Clark M with seizure like activity. EEG to evaluate for seizure.  Level of alertness:awake, asleep  AEDs during EEG study:VPA, LEV, PHT, clonazepam  Technical aspects: This EEG study was done with scalp electrodes positioned according to the 10-20 International system of electrode placement. Electrical activity was acquired at a sampling rate of 500Hz  and reviewed with a high frequency filter of 70Hz  and a low frequency filter of 1Hz . EEG data were recorded continuously and digitally stored.  DESCRIPTION:EEG showed continuous generalized 3 to 6 Hz theta delta slowing, maximal in right posterior quadrant.There were alsosharp waves in right posterior quadrant.Left arm rhythmic jerking wasnotedConcomitant EEG did not show any seizure. Hyperventilation and photic stimulation were not performed.   IMPRESSION: This study issuggestive of epileptogenicity in right posterior quadrant as well as cortical dysfunction due to underlying structural abnormality in the same region.Left arm jerking was recorded without clear EEG changes.However, focal seizures without alteration of awareness can sometimes not be seen on surface EEG. Therefore, clinical correlation is recommended.   EEG is similar to previous study.

## 2019-02-16 NOTE — Progress Notes (Addendum)
Patient in bed has had moments of moaning aloud, falling back asleep, waking up moaning aloud and shaking head from side to side. Patient is noninterative with this nurse. Patient sometimes turns his head toward voice. He does not blink to threat. PERRLA. Patient also when asleep, slightly has eyes open. Oxygen applied earlier in shift for patient's comfort. Resp uneven, abdominal breathing noted occasionally. Klonopin held tonight due to lethargy. When patient is awake he moves his right hand up towards his head, sometimes pulling off a telemetry lead here and there. Saturations have remained above 92%. Respirations have remained in 20 and below breaths per minute while resting but rises to 30 while awake. IV seizure meds given per MD order this shift. Blood sugar taken and no coverage was required. Will continue to closely monitor this patient.  Updated daughter Davy Pique on patient's status. States understanding.  0200- Ativan 1mg  given d/t patient's viewings on the monitor. Patient event recorded. Patient moves head from side and side and moans. Not responsive to pain. Does not blink to threat. Moves right arm up and down but does not move leg arm or leg at all. Will continue to observe patient.  0352- Patient is currently resting with eyes closed. 02 sats still at 95-100% on 1L. Resp still a bit irregular, snoring noted. No apparent distress noted.   0610- Patient opened his eyes and moved his right arm up and down. This nurse called his name and he turned his head towards nurse and opened his eyes. Was able to track only for a few seconds. Patient speech incomprehensible and unclear. Patient was told that his daughter called to check on him and she wanted to come see him and that she loves him and was thinking about him. Patient continues to turn his head from side to side. Responds to painful stimuli to right side and sternal rub. Does not respond to painful stimuli to left side.

## 2019-02-16 NOTE — Progress Notes (Signed)
vLTM maintenance   No skin breakdown

## 2019-02-17 ENCOUNTER — Other Ambulatory Visit: Payer: Self-pay

## 2019-02-17 ENCOUNTER — Encounter (HOSPITAL_COMMUNITY): Payer: Self-pay | Admitting: *Deleted

## 2019-02-17 DIAGNOSIS — Z515 Encounter for palliative care: Secondary | ICD-10-CM

## 2019-02-17 DIAGNOSIS — Z7189 Other specified counseling: Secondary | ICD-10-CM

## 2019-02-17 LAB — BASIC METABOLIC PANEL
Anion gap: 13 (ref 5–15)
BUN: 39 mg/dL — ABNORMAL HIGH (ref 8–23)
CO2: 24 mmol/L (ref 22–32)
Calcium: 9 mg/dL (ref 8.9–10.3)
Chloride: 106 mmol/L (ref 98–111)
Creatinine, Ser: 1.25 mg/dL — ABNORMAL HIGH (ref 0.61–1.24)
GFR calc Af Amer: >60 mL/min (ref >60)
GFR calc non Af Amer: 53 mL/min — ABNORMAL LOW (ref >60)
Glucose, Bld: 204 mg/dL — ABNORMAL HIGH (ref 70–99)
Potassium: 4.8 mmol/L (ref 3.5–5.1)
Sodium: 143 mmol/L (ref 135–145)

## 2019-02-17 LAB — GLUCOSE, CAPILLARY
Glucose-Capillary: 230 mg/dL — ABNORMAL HIGH (ref 70–99)
Glucose-Capillary: 169 mg/dL — ABNORMAL HIGH (ref 70–99)
Glucose-Capillary: 155 mg/dL — ABNORMAL HIGH (ref 70–99)
Glucose-Capillary: 159 mg/dL — ABNORMAL HIGH (ref 70–99)

## 2019-02-17 MED ORDER — VALPROATE SODIUM 500 MG/5ML IV SOLN
500.0000 mg | Freq: Three times a day (TID) | INTRAVENOUS | Status: DC
  Administered 2019-02-17 – 2019-02-19 (×6): 500 mg via INTRAVENOUS
  Filled 2019-02-17 (×8): qty 5

## 2019-02-17 MED ORDER — LABETALOL HCL 5 MG/ML IV SOLN
5.0000 mg | INTRAVENOUS | Status: DC | PRN
  Filled 2019-02-17: qty 4

## 2019-02-17 MED ORDER — ENOXAPARIN SODIUM 40 MG/0.4ML ~~LOC~~ SOLN
40.0000 mg | SUBCUTANEOUS | Status: DC
  Administered 2019-02-17 – 2019-02-18 (×2): 40 mg via SUBCUTANEOUS
  Filled 2019-02-17 (×2): qty 0.4

## 2019-02-17 NOTE — Progress Notes (Signed)
Reason for consult: Seizure  Subjective: No acute events overnight.  Per bedside nurse, she has not witnessed any left-sided jerking.  However patient continues to be encephalopathic and does not follow commands.   ROS: Unable to obtain due to poor mental status  Examination  Vital signs in last 24 hours: Temp:  [97.5 F (36.4 C)-98.5 F (36.9 C)] 98.1 F (36.7 C) (09/01 1236) Pulse Rate:  [65-84] 78 (09/01 1236) Resp:  [16-21] 20 (09/01 1236) BP: (137-176)/(71-107) 142/90 (09/01 1236) SpO2:  [92 %-98 %] 92 % (09/01 1236) Weight:  [103.6 kg] 103.6 kg (09/01 1257)  General: lying in bed, not in apparent distress CVS: pulse-normal rate and rhythm RS: breathing comfortably Extremities: normal   Neuro: MS: Lethargic, briefly opens eyes to tactile stimulation but does not follow commands CN: pupils equal and reactive, is able to track in the room, face symmetric Motor: Left upper and lower extremity withdraws to noxious stimuli with antigravity strength, right upper and lower extremity spontaneous antigravity movements  reflexes: 2+ bilaterally over patella, biceps Coordination: Unable to assess as patient is unable to follow commands Gait: not tested  Basic Metabolic Panel: Recent Labs  Lab 02/11/2019 0540 02/13/19 0101 02/13/19 0656 02/16/19 0727 02/17/19 0634  NA 140 136 136 142 143  K 5.9* 4.5 4.4 4.7 4.8  CL 101 100 101 104 106  CO2 26 22 21* 22 24  GLUCOSE 151* 173* 177* 206* 204*  BUN 17 20 21  43* 39*  CREATININE 1.07 1.08 1.06 1.26* 1.25*  CALCIUM 9.7 9.7 9.8 9.0 9.0  MG  --  2.0  --  2.8*  --   PHOS  --  3.8  --  4.6  --     CBC: Recent Labs  Lab 02/11/19 1252 02/05/2019 0540 02/13/19 0101 02/16/19 0727  WBC 11.3* 12.2* 8.7 10.0  NEUTROABS  --   --  7.7  --   HGB 15.5 15.5 16.3 18.2*  HCT 47.3 47.8 48.7 56.2*  MCV 94.6 95.2 93.1 95.7  PLT 168 185 194 179    Imaging MRI brain with and without contrast:Moderate area of enhancement in the right  occipital lobe and small areas of enhancement in the right frontal parietal white matter which is less extensive than the extent of T2 signal. A mass is favored, especially glioblastoma. Subacute right PCA infarct with watershed ischemia in the right cerebral white matter is a ifferential consideration, but would not expect the necrotic enhancement pattern at the level of the occipital lobe   ASSESSMENT AND PLAN 85 year old man with above past medical history presented with left side jerking. MRI concerning for GBM in the right occipital lobe plus/minus stroke in that area as well. Patient was in focal status epilepticus which has since resolved.   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 Keppra 1500mg  BID             2. Phenytoin 100mg  TID: will check levels again tomorrow and titrate as needed -As patient has not had any further seizures in over 48 hours and continues to be encephalopathic, we will try to lower his antiseizure medications.  Patient received Vimpat 100 mg this morning.  Will DC vimpat starting today's evening dose.  -Also patient was on Depakote 750-500-750 mg.  Will decrease  to 500 mg 3 times daily. -We will continue to use clonazepam 0.5 mg as needed for seizure recurrence. -Continue LTM monitoring today while we are weaning off AEDs monitor for subclinical seizures -Continue seizure precautions -PRN IV Ativan 2 mg for generalized tonic-clonic seizure lasting more than 2 minutes   I have spent a total of35 minuteswith the patient and care team reviewing hospitalnotes,  test results, labs and examining the patient as well as establishing an assessment and plan that was discussed personally with the patient.>50% of time was spent in direct patient care.

## 2019-02-17 NOTE — Progress Notes (Signed)
vLTM maintenance  No skin breakdown

## 2019-02-17 NOTE — Progress Notes (Signed)
Spoke with son, Darrell Clark with an update about his dad. Was told that he is still resting and he stirs a little when his name is called or when he is touched on his right side. Still flaccid on his left side. Less swelling noted on his left arm. Elevated on pillows. Continue to be turned q 2 hours.

## 2019-02-17 NOTE — Procedures (Signed)
Patient Name:Darrell Clark [id number] Epilepsy Attending:Dakotah Orrego Barbra Sarks Referring Physician/Provider:Dr Dana Allan Duration:8/30/20202309 to 02/16/2019 2309  [name] with seizure like activity. EEG to evaluate for seizure.  Level of alertness:awake, asleep  AEDs during EEG study:VPA, LEV, PHT, clonazepam  Technical aspects: This EEG study was done with scalp electrodes positioned according to the 10-20 International system of electrode placement. Electrical activity was acquired at a sampling rate of 500Hz  and reviewed with a high frequency filter of 70Hz  and a low frequency filter of 1Hz . EEG data were recorded continuously and digitally stored.  DESCRIPTION:EEG showed continuous generalized 3 to 6 Hz theta delta slowing, maximal in right posterior quadrant.There were alsosharp waves in right posterior quadrant.Hyperventilation and photic stimulation were not performed.  Event button was pushed on 02/16/2019 at 0136 for unclear reasons. On video, patient  appeared asleep and snoring. Concomitant EEG didn't show any EEG activity to suggest seizures.    IMPRESSION: This study issuggestive of epileptogenicity in right posterior quadrant as well as cortical dysfunction due to underlying structural abnormality in the same region.There was also evidence of moderate to severe diffuse encephalopathy. No clear seizures were seen on EEG. Clinical correlation is recommended.

## 2019-02-17 NOTE — Progress Notes (Addendum)
PROGRESS NOTE  Darrell Clark [id number] DOB: [birth year] DOA: 02/04/2019 PCP: Maryland Pink, MD  HPI/Recap of past 88 hours: 85 year old male with history of dementia, CHF, CAD, PAD, DM-2, HTN, HLD, prostate cancer and obesity who presented to [facility] on 8/26 with seizure and transferred to: 8/27 for EEG.  Patient was loaded with Depakote 20 mg/KG and continued on 50 mg/KG/day in 3 divided doses at [facility].  CT head w/o contrast at [facility] revealed interval development of ill-defined right-sided subcortical hypodensities concerning for infarction or edema.  MRI brain W/O contrast revealed cortical subcortical edema within right occipital lobe at the right frontoparietal junction concerning for subacute infarction or neoplastic process.  MRI brain W/contrast showed moderate area of enhancement in the right occipital lobe and small areas of enhancement in the right frontoparietal white matter favoring mass especially GBM and a subacute right PCA finding concerning for mass versus infarct.  On arrival here, evaluated by neurology.  Stat EEG and LTM EEG were ordered by neurology.  He was loaded with Keppra 2000 mg IV once.  Continued on Depakote 500 mg 3 times daily, Keppra 500 mg twice daily, and Klonopin 0.25 mg 3 times daily.  He was also started on dexamethasone 4 mg every 6 hours IV.  Patient's EEG revealed epileptiform discharge in the right posterior quadrant.    Seen by neuro-oncology.  Neurology following.  02/17/19: Patient was seen and examined at his bedside.  Still lethargic and does not follow commands.  Assessment/Plan: Active Problems:   Seizure ([place])  New onset seizure likely secondary to brain mass possibly GBM  Reviewed MRI done on admission which showed R posterior brain mass Seen by neuro-oncology.  Recommendations for repeat MRI in about a week if the patient improves considerably Neurology following, antiepileptic medication adjusted Continue close monitoring with  seizure precaution Continue Decadron, Keppra 1500 mg twice daily Neuro stopped Vimpat at 100 mg twice daily Klonopin being held due to somnolence Valproic acid dose decreased to 500 mg daily, last level was therapeutic. C/w IV Dilantin load 100 mg 3 times daily Continue LTM monitoring today while weaning off AEDs to monitor for subclinical seizures, per neurology.  Persistent acute metabolic encephalopathy most likely multifactorial secondary to brain mass with suspicion for GBM versus acute illness in the setting of dementia Treat underlying conditions Neuro oncology consulted Dr. Mickeal Skinner will see the patient. Monitor electrolytes  BMP, mg2+ and phos in good ranges.  Acute dehydration in the setting of persistent acute metabolic encephalopathy Has not been able to take in po due to somnolence Continue gentle IV fluid hydration LR at 50cc/hr Closely monitor volume status and urine output  Type 2 diabetes with steroid-induced hyperglycemia Hemoglobin A1c 6.5 on 01/21/2019 Continue insulin sliding scale Continue to avoid hypoglycemia  Uncontrolled hypertension Blood pressure not at goal Added PRN IV labetalol Continue to closely monitor vital signs  History of CHF/CAD/PAD: Stable no cardiopulmonary issues. -Resume home meds when able  History of prostate cancer s/p status post radiation in 2011 in remission. -Outpatient follow-up with urologist  DVT prophylaxis:  Subcu Lovenox daily Code Status: Full code Family Communication: Patient and/or RN. Available if any question. Disposition Plan: Remains inpatient Consultants: Neurology, neuro-oncology  Procedures:  8/27-EEG-epileptiform discharge from posterior quadrant 8/27-LVM EEG ongoing.  Microbiology summarized: None    Objective: Vitals:   02/17/19 0722 02/17/19 1236 02/17/19 1257 02/17/19 1548  BP: (!) 176/90 (!) 142/90  (!) 147/87  Pulse: 83 78  81  Resp: 18 20  (!)  22  Temp: (!) 97.5 F (36.4 C) 98.1 F  (36.7 C)  98.1 F (36.7 C)  TempSrc: Oral     SpO2: 97% 92%  99%  Weight:   103.6 kg   Height:   5\' 11"  (1.803 m)     Intake/Output Summary (Last 24 hours) at 02/17/2019 1708 Last data filed at 02/17/2019 1534 Gross per 24 hour  Intake 832.99 ml  Output 1800 ml  Net -967.01 ml   Filed Weights   02/17/19 1257  Weight: 103.6 kg    Exam:  . General: 85 y.o. year-old male well-developed well-nourished lethargic and not following commands.   . Cardiovascular: Regular rate and rhythm no rubs or gallops.  No JVD or thyromegaly noted. Marland Kitchen Respiratory: Clear to auscultation.  No wheezes or rales.  Poor inspiratory effort.   . Abdomen: Obese nontender nondistended hypoactive bowel sounds. . Musculoskeletal: No lower extremity edema.  2/4 pulses in all 4 extremities. Marland Kitchen Psychiatry: Unable to assess mood due to lethargy.  Data Reviewed: CBC: Recent Labs  Lab 02/11/19 1252 02/16/2019 0540 02/13/19 0101 02/16/19 0727  WBC 11.3* 12.2* 8.7 10.0  NEUTROABS  --   --  7.7  --   HGB 15.5 15.5 16.3 18.2*  HCT 47.3 47.8 48.7 56.2*  MCV 94.6 95.2 93.1 95.7  PLT 168 185 194 0000000   Basic Metabolic Panel: Recent Labs  Lab 01/25/2019 0540 02/13/19 0101 02/13/19 0656 02/16/19 0727 02/17/19 0634  NA 140 136 136 142 143  K 5.9* 4.5 4.4 4.7 4.8  CL 101 100 101 104 106  CO2 26 22 21* 22 24  GLUCOSE 151* 173* 177* 206* 204*  BUN 17 20 21  43* 39*  CREATININE 1.07 1.08 1.06 1.26* 1.25*  CALCIUM 9.7 9.7 9.8 9.0 9.0  MG  --  2.0  --  2.8*  --   PHOS  --  3.8  --  4.6  --    GFR: Estimated Creatinine Clearance: 53.9 mL/min (A) (by C-G formula based on SCr of 1.25 mg/dL (H)). Liver Function Tests: Recent Labs  Lab 02/11/19 1252 02/13/19 0101 02/13/19 0656  AST 28  --  64*  ALT 16  --  26  ALKPHOS 76  --  71  BILITOT 0.7  --  1.2  PROT 7.3  --  7.3  ALBUMIN 4.0 3.6 3.7   No results for input(s): LIPASE, AMYLASE in the last 168 hours. Recent Labs  Lab 02/15/19 0657 02/16/19 1712   AMMONIA 37* 24   Coagulation Profile: No results for input(s): INR, PROTIME in the last 168 hours. Cardiac Enzymes: No results for input(s): CKTOTAL, CKMB, CKMBINDEX, TROPONINI in the last 168 hours. BNP (last 3 results) No results for input(s): PROBNP in the last 8760 hours. HbA1C: No results for input(s): HGBA1C in the last 72 hours. CBG: Recent Labs  Lab 02/16/19 1257 02/16/19 1619 02/16/19 2138 02/17/19 0717 02/17/19 1242  GLUCAP 161* 166* 185* 230* 169*   Lipid Profile: No results for input(s): CHOL, HDL, LDLCALC, TRIG, CHOLHDL, LDLDIRECT in the last 72 hours. Thyroid Function Tests: No results for input(s): TSH, T4TOTAL, FREET4, T3FREE, THYROIDAB in the last 72 hours. Anemia Panel: No results for input(s): VITAMINB12, FOLATE, FERRITIN, TIBC, IRON, RETICCTPCT in the last 72 hours. Urine analysis:    Component Value Date/Time   COLORURINE YELLOW (A) 02/11/2019 1340   APPEARANCEUR CLEAR (A) 02/11/2019 1340   APPEARANCEUR Clear 01/19/2013 1937   LABSPEC 1.019 02/11/2019 1340   LABSPEC 1.017 01/19/2013 1937  PHURINE 5.0 02/11/2019 1340   GLUCOSEU NEGATIVE 02/11/2019 1340   GLUCOSEU Negative 01/19/2013 1937   HGBUR NEGATIVE 02/11/2019 [place] 02/11/2019 1340   BILIRUBINUR Negative 01/19/2013 1937   KETONESUR 5 (A) 02/11/2019 [place] 02/11/2019 1340   NITRITE NEGATIVE 02/11/2019 1340   LEUKOCYTESUR NEGATIVE 02/11/2019 1340   LEUKOCYTESUR Negative 01/19/2013 1937   Sepsis Labs: @LABRCNTIP (procalcitonin:4,lacticidven:4)  ) Recent Results (from the past 240 hour(s))  Novel Coronavirus, NAA (Hosp order, Send-out to Ref Lab; TAT 18-24 hrs     Status: None   Collection Time: 02/11/19  1:40 PM   Specimen: Nasopharyngeal Swab; Respiratory  Result Value Ref Range Status   SARS-CoV-2, NAA NOT DETECTED NOT DETECTED Final    Comment: (NOTE) This test was developed and its performance characteristics determined by [employer]. This test has not been FDA cleared or approved. This test has been authorized by FDA under an Emergency Use Authorization (EUA). This test is only authorized for the duration of time the declaration that circumstances exist justifying the authorization of the emergency use of in vitro diagnostic tests for detection of SARS-CoV-2 virus and/or diagnosis of COVID-19 infection under section 564(b)(1) of the Act, 21 U.S.C. KA:123727), unless the authorization is terminated or revoked sooner. When diagnostic testing is negative, the possibility of a false negative result should be considered in the context of a patient's recent exposures and the presence of clinical signs and symptoms consistent with COVID-19. An individual without symptoms of COVID-19 and who is not shedding SARS-CoV-2 virus would expect to have a negative (not detected) result in this assay. Performed  At: [facility] [address] [place], [place] [id number] Rush Farmer MD [id number]    [place]  Final    Comment: Performed at [facility], [address] [place], [place] [address]  MRSA PCR Screening     Status: None   Collection Time: 02/13/19 12:23 AM   Specimen: Nasal Mucosa; Nasopharyngeal  Result Value Ref Range Status   MRSA by PCR NEGATIVE NEGATIVE Final    Comment:        The GeneXpert MRSA Assay (FDA approved for NASAL specimens only), is one component of a comprehensive MRSA colonization surveillance program. It is not intended to diagnose MRSA infection nor to guide or monitor treatment for MRSA infections. Performed at [facility], [place] [address], [place], [place] [address]       Studies: No results found.  Scheduled Meds: . dexamethasone (DECADRON) injection  4 mg Intravenous Q6H  . insulin aspart  0-5 Units Subcutaneous QHS  . insulin aspart  0-9 Units Subcutaneous TID WC  . phenytoin (DILANTIN) IV  100 mg  Intravenous Q8H    Continuous Infusions: . levETIRAcetam 1,500 mg (02/17/19 1214)  . valproate sodium 55 mL/hr at 02/17/19 1534     LOS: 5 days     Kayleen Memos, MD [facility] Pager [phone]  If 7PM-7AM, please contact night-coverage [email or web address] Password [email or web address] 02/17/2019, 5:08 PM

## 2019-02-17 NOTE — Consult Note (Signed)
Consultation Note Date: 02/17/2019   Patient Name: Darrell Clark  DOB: Dec 13, 1932  MRN: [id number]  Age / Sex: 85 y.o., male  PCP: [place] Pink, MD Referring Physician: Kayleen Memos, DO  Reason for Consultation: Establishing goals of care  HPI/Patient Profile: 85 y.o. male  with past medical history of dementia, stroke, PVD, prostate cancer, HTN, HLD, T2DM, CAD, and CHF admitted on 01/19/2019 with seizures.  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 Seizures have resolved, but remains altered - neuro starting to lower AEDs. PMT consulted for [place].   Clinical Assessment and Goals of Care: I have reviewed medical records including EPIC notes, labs and imaging, and received report from RN - RN shares patient mumbles occasionally, mostly somnolent. Unable to tolerate oral intake d/t AMS.  HCPOA located prior to speaking with son - patient has documented medical decision makers - son Darrell Clark is listed first, followed by his daughter, Darrell Clark.  I then spoke with patient's son, Darrell Clark,  to discuss diagnosis prognosis, [place], EOL wishes, disposition and options.  I introduced Palliative Medicine as specialized medical care for people living with serious illness. It focuses on providing relief from the symptoms and stress of a serious illness. The goal is to improve quality of life for both the patient and the family.  Darrell Clark tells me about patient prior to current hospitalization - he was living along, still mowing his yard and driving. Some functional decline - used a walker with ambulation d/t hip pain. Good appetite. Darrell Clark did have to manage his medications and finances. We discussed patient's dementia diagnosis - Darrell Clark tells me that if he does have dementia, he believes it is very mild. He tells me he would have some periods of forgetfulness or repeat himself but nothing extreme.    We discussed his current illness and what it means in the larger context  of his on-going co-morbidities.  Darrell Clark tells me he knows there is a tumor but does not know any more than that. Further, he shares that his wife is an Therapist, sports and she has shared some information with him about brain tumors. We discussed that it is suspected that his father has a brain tumor. We discussed limited options for treatment. Discussed very high risk of surgery/biopsy. Darrell Clark is grateful for the information - tells me he is not shocked by it. Further we discussed prognosis - discussed general prognosis of glioblastoma but also discussed his dad's specific situation and the need to see how his mental status does - can he improve enough to maintain adequate nutrition and hydration? Darrell Clark expresses understanding to all of this and asks appropriate questions.   Darrell Clark tells me ultimately he remains hopeful for some improvement - improvement in mental status so that he can have some interactions with his father and have "a little more time". He also tells me he is realistic and understands severity of situation/poor prognosis.   I attempted to elicit values and goals of care important to the patient.  Darrell Clark shares about how his dad made medical decisions when his mother (the patient's spouse) was ill. He tells me that his father wished for continued aggressive care/full code until his mother reached a point where it was obvious that she was at end of life. Christ would like to approach his father's care the same way - he would like to see how his mental status is over the next couple of days - if improves then he will speak to his dad about decisions; however,  if it does not improve he would be open to discussing a deescalation of care. For now, to remain full code.  Darrell Clark also asks about location of care - we discussed that this also depends on mental status in coming days but we briefly reviewed all options including hospice facility (if patient does not improve and appears to be in final days-weeks of life),  home with hospice, and facility placement.   Of note, Darrell Clark also shares that his sister is involved in the patient's care; however, he would like to be primary contact and he will communicate information with his sister. He shares about a history of mental illness/depression.   We planned to discuss situation again tomorrow.   Questions and concerns were addressed. The family was encouraged to call with questions or concerns.   Primary Decision Maker HCPOA/son - Darrell Clark   SUMMARY OF RECOMMENDATIONS   - continue current care - son educated about situation and options moving forward - son would like to see how patient does in coming days prior to making decisions - disposition options were discussed  Code Status/Advance Care Planning:  Full code  Psycho-social/Spiritual:   Desire for further Chaplaincy support:no  Additional Recommendations: Education on Hospice  Prognosis:   < 6 months - potentially much less without improvement in mental status  Discharge Planning: To Be Determined      Primary Diagnoses: Present on Admission: **None**   I have reviewed the medical record, interviewed the patient and family, and examined the patient. The following aspects are pertinent.  Past Medical History:  Diagnosis Date  . Acquired cyst of kidney   . Allergy   . Anxiety   . Arthritis   . Atherosclerosis   . BPH (benign prostatic hyperplasia)   . CAD (coronary artery disease)   . Cardiac arrhythmia   . CHF (congestive heart failure) ([place])   . Chronic prostatitis   . Degenerative arthritis   . Dementia ([place])    mild  . Depression   . Diabetes mellitus without complication ([place])   . Dyspnea    with exertion  . Elevated prostate specific antigen (PSA)   . Encysted hydrocele   . GERD (gastroesophageal reflux disease)   . Glaucoma (increased eye pressure)   . Gout   . Gross hematuria   . Hemorrhoids   . HLD (hyperlipidemia)   . Hyperlipidemia, unspecified   .  Hypertension   . Hypertrophy of breast   . Impotence of organic origin   . Incomplete bladder emptying   . Malignant neoplasm of prostate ([place])   . Osteoarthritis   . Other specified disorders of the male genital organs   . Prostate cancer ([place])   . PVD (peripheral vascular disease) ([place])   . Sciatica   . Spermatocele   . Spinal stenosis of lumbar region with radiculopathy 11/11/2015  . Stroke ([place])   . Testicular hypofunction    other  . Urinary frequency   . Urinary obstruction    not elsewhere classified  . Vertigo    Social History   Socioeconomic History  . Marital status: Widowed    Spouse name: Not on file  . Number of children: 2  . Years of education: 12+  . Highest education level: Some college, no degree  Occupational History  . Not on file  Social Needs  . Financial resource strain: Not on file  . Food insecurity    Worry: Not on file    Inability: Not on  file  . Transportation needs    Medical: Not on file    Non-medical: Not on file  Tobacco Use  . Smoking status: Former Smoker    Packs/day: 1.00    Years: 50.00    Pack years: 50.00    Types: Cigarettes    Quit date: 11/19/2007    Years since quitting: 11.2  . Smokeless tobacco: Never Used  Substance and Sexual Activity  . Alcohol use: No  . Drug use: No  . Sexual activity: Not Currently  Lifestyle  . Physical activity    Days per week: Not on file    Minutes per session: Not on file  . Stress: Not on file  Relationships  . Social Herbalist on phone: Not on file    Gets together: Not on file    Attends religious service: Not on file    Active member of club or organization: Not on file    Attends meetings of clubs or organizations: Not on file    Relationship status: Not on file  Other Topics Concern  . Not on file  Social History Narrative  . Not on file   Family History  Problem Relation Age of Onset  . Heart attack Mother   . Hypertension Mother   . Heart attack Father    . Hypertension Father   . Hypertension Sister   . Cancer Brother   . Hypertension Brother   . Hypertension Brother   . Hypertension Brother   . Hypertension Sister   . Kidney cancer Neg Hx   . Prostate cancer Neg Hx   . GU problems Neg Hx    Scheduled Meds: . dexamethasone (DECADRON) injection  4 mg Intravenous Q6H  . insulin aspart  0-5 Units Subcutaneous QHS  . insulin aspart  0-9 Units Subcutaneous TID WC  . phenytoin (DILANTIN) IV  100 mg Intravenous Q8H   Continuous Infusions: . levETIRAcetam 1,500 mg (02/17/19 1214)  . valproate sodium 55 mL/hr at 02/17/19 1534   PRN Meds:.clonazepam, labetalol, LORazepam Allergies  Allergen Reactions  . Niacin Itching and Rash    Other reaction(s): Unknown Other reaction(s): UNKNOWN  . Niacin And Related Rash    Vital Signs: BP (!) 147/87 (BP Location: Right Arm)   Pulse 81   Temp 98.1 F (36.7 C)   Resp (!) 22   Ht 5\' 11"  (1.803 m)   Wt 103.6 kg   SpO2 99%   BMI 31.85 kg/m  Pain Scale: PAINAD   Pain Score: 1    SpO2: SpO2: 99 % O2 Device:SpO2: 99 % O2 Flow Rate: .O2 Flow Rate (L/min): 2 L/min  IO: Intake/output summary:   Intake/Output Summary (Last 24 hours) at 02/17/2019 1602 Last data filed at 02/17/2019 1534 Gross per 24 hour  Intake 832.99 ml  Output 2200 ml  Net -1367.01 ml    LBM: Last BM Date: 01/14/19 Baseline Weight: Weight: 103.6 kg Most recent weight: Weight: 103.6 kg     Palliative Assessment/Data: PPS 10% (d/t no PO intake)    The above conversation was completed via telephone due to the visitor restrictions during the COVID-19 pandemic. Thorough chart review and discussion with necessary members of the care team was completed as part of assessment. All issues were discussed and addressed but no physical exam was performed.  Time Total: 70 minutes Greater than 50%  of this time was spent counseling and coordinating care related to the above assessment and plan.  Juel Burrow,  [place], AGNP-C  Palliative Medicine Team [phone] Pager: [phone]

## 2019-02-17 DEATH — deceased

## 2019-02-18 LAB — BASIC METABOLIC PANEL
Anion gap: 18 — ABNORMAL HIGH (ref 5–15)
BUN: 38 mg/dL — ABNORMAL HIGH (ref 8–23)
CO2: 24 mmol/L (ref 22–32)
Calcium: 9.4 mg/dL (ref 8.9–10.3)
Chloride: 102 mmol/L (ref 98–111)
Creatinine, Ser: 1.12 mg/dL (ref 0.61–1.24)
GFR calc Af Amer: >60 mL/min (ref >60)
GFR calc non Af Amer: 60 mL/min — ABNORMAL LOW (ref >60)
Glucose, Bld: 178 mg/dL — ABNORMAL HIGH (ref 70–99)
Potassium: 4.8 mmol/L (ref 3.5–5.1)
Sodium: 144 mmol/L (ref 135–145)

## 2019-02-18 LAB — GLUCOSE, CAPILLARY
Glucose-Capillary: 216 mg/dL — ABNORMAL HIGH (ref 70–99)
Glucose-Capillary: 257 mg/dL — ABNORMAL HIGH (ref 70–99)
Glucose-Capillary: 198 mg/dL — ABNORMAL HIGH (ref 70–99)
Glucose-Capillary: 243 mg/dL — ABNORMAL HIGH (ref 70–99)

## 2019-02-18 LAB — PHENYTOIN LEVEL, TOTAL: Phenytoin Lvl: 19.4 ug/mL (ref 10.0–20.0)

## 2019-02-18 LAB — VALPROIC ACID LEVEL: Valproic Acid Lvl: 66 ug/mL (ref 50.0–100.0)

## 2019-02-18 MED ORDER — LABETALOL HCL 5 MG/ML IV SOLN
5.0000 mg | Freq: Three times a day (TID) | INTRAVENOUS | Status: DC
  Administered 2019-02-18 – 2019-02-19 (×3): 5 mg via INTRAVENOUS
  Filled 2019-02-18 (×4): qty 4

## 2019-02-18 NOTE — Progress Notes (Signed)
Reason for consult: Seizure  Subjective: No acute events overnight.  Bedside nurse denies any further left arm jerking.  However patient continues to be somnolent.  ROS: Unable to obtain due to poor mental status  Examination  Vital signs in last 24 hours: Temp:  [98 F (36.7 C)-98.9 F (37.2 C)] 98.9 F (37.2 C) (09/02 1108) Pulse Rate:  [81-100] 100 (09/02 1125) Resp:  [15-33] 19 (09/02 1125) BP: (137-159)/(87-111) 144/103 (09/02 1108) SpO2:  [94 %-99 %] 97 % (09/02 1125)  General: lying in bed, not in apparent distress CVS: pulse-normal rate and rhythm RS: breathing comfortably, snoring Extremities: normal   Neuro: MS: Lethargic, briefly opens eyes to repeated tactile stimulation but does not follow commands CN: pupils equal and reactive,  oculocephalic reflex intact, face symmetric Motor: Left upper and lower extremity withdraws to noxious stimuli, right upper and lower extremity withdraws to noxious stimuli with antigravity strength Reflexes: 2+ bilaterally over patella, biceps Coordination: Unable to assess as patient is unable to follow commands Gait: not tested  Basic Metabolic Panel: Recent Labs  Lab 02/13/19 0101 02/13/19 0656 02/16/19 0727 02/17/19 0634 02/18/19 0619  NA 136 136 142 143 144  K 4.5 4.4 4.7 4.8 4.8  CL 100 101 104 106 102  CO2 22 21* 22 24 24   GLUCOSE 579FGE* 177* 206* 204* 178*  BUN 20 21 43* 39* 38*  CREATININE 1.08 1.06 1.26* 1.25* 1.12  CALCIUM 9.7 9.8 9.0 9.0 9.4  MG 2.0  --  2.8*  --   --   PHOS 3.8  --  4.6  --   --     CBC: Recent Labs  Lab 02/14/2019 0540 02/13/19 0101 02/16/19 0727  WBC 12.2* 8.7 10.0  NEUTROABS  --  7.7  --   HGB 15.5 16.3 18.2*  HCT 47.8 48.7 56.2*  MCV 95.2 93.1 95.7  PLT 185 194 179     Coagulation Studies: No results for input(s): LABPROT, INR in the last 72 hours.   Imaging MRI brain with and without contrast:Moderate area of enhancement in the right occipital lobe and small areas of  enhancement in the right frontal parietal white matter which is less extensive than the extent of T2 signal. A mass is favored, especially glioblastoma. Subacute right PCA infarct with watershed ischemia in the right cerebral white matter is a ifferential consideration, but would not expect the necrotic enhancement pattern at the level of the occipital lobe   ASSESSMENT AND PLAN 85 year old man with above past medical history presentedwith left side jerking.MRI concerning for GBM in the right occipital lobe plus/minus stroke in that area as well.Patient was in focal status epilepticus which has since resolved.  Epilepsia partialis continua ( resolved) Focal seizures  Possible new GBM Possible new late acute/early subacute occipital and right frontoparietal stroke Encephalopathy -Patient continues to encephali encephalopathic.  LTM did not show any subclinical seizures.  Is likely that this is secondary to GBM/cerebral edema, antiepileptic medications and being in focal status epilepticus for days.   Recommendations: - Will continue AEDs as follow 1. Keppra 1500mg  BID  2. Phenytoin 100mg  TID: will check levels every other day and titrate as needed  3. VPA 500mg  BID -As patient has been seizure-free for the last 48 hours and we do not anticipate weaning down any further antiseizure medications, we will discontinue LTM monitoring. -If patient does not improve by next week and does not have any seizure recurrence, we will attempt to lower phenytoin -We will continue to  use clonazepam 0.5 mg as needed for seizure recurrence. -Continue seizure precautions -PRN IV Ativan 2 mg for generalized tonic-clonic seizure lasting more than 2 minutes  Thank you for allowing [place] to participate in the care of this patient.  Please page neuro hospitalist on call for any further questions.  Darrell Clark Barbra Sarks

## 2019-02-18 NOTE — Progress Notes (Signed)
LTM EEG discontinued - no skin breakdown at unhook.   

## 2019-02-18 NOTE — Procedures (Signed)
Patient Name:Jaret LYCAN CORBIN [id number] Epilepsy Attending:Trevante Tennell Barbra Sarks Referring Physician/Provider:Dr Dana Allan Duration:02/17/2019 [id number] to 02/18/2019 1116  [name] with seizure like activity. EEG to evaluate for seizure.  Level of alertness:awake, asleep  AEDs during EEG study:VPA, LEV,PHT  Technical aspects: This EEG study was done with scalp electrodes positioned according to the 10-20 International system of electrode placement. Electrical activity was acquired at a sampling rate of 500Hz  and reviewed with a high frequency filter of 70Hz  and a low frequency filter of 1Hz . EEG data were recorded continuously and digitally stored.  DESCRIPTION:EEG showed continuous generalized 3 to 6 Hz theta delta slowing, maximal in right posterior quadrant.There were also PLEds ( Periodic lateralized epileptiform discharges) in right posterior quadrant 1-1.5hz .  No clear seizures were seen.Hyperventilation and photic stimulation were not performed.   IMPRESSION: This study issuggestive of epileptogenicity in right posterior quadrant as well as cortical dysfunction due to underlying structural abnormality in the same region.There was also evidence of moderate to severe diffuse encephalopathy. No clear seizures were seen on EEG. Clinical correlation is recommended.

## 2019-02-18 NOTE — Progress Notes (Signed)
PROGRESS NOTE  Darrell Clark [id number] DOB: January 09, 1933 DOA: 02/16/2019 PCP: Maryland Pink, MD  HPI/Recap of past 39 hours: 85 year old male with history of dementia, CHF, CAD, PAD, DM-2, HTN, HLD, prostate cancer and obesity who presented to [facility] on 8/26 with seizure and transferred to: 8/27 for EEG.  Patient was loaded with Depakote 20 mg/KG and continued on 50 mg/KG/day in 3 divided doses at [facility].  CT head w/o contrast at [facility] revealed interval development of ill-defined right-sided subcortical hypodensities concerning for infarction or edema.  MRI brain W/O contrast revealed cortical subcortical edema within right occipital lobe at the right frontoparietal junction concerning for subacute infarction or neoplastic process.  MRI brain W/contrast showed moderate area of enhancement in the right occipital lobe and small areas of enhancement in the right frontoparietal white matter favoring mass especially GBM and a subacute right PCA finding concerning for mass versus infarct.  On arrival here, evaluated by neurology.  Stat EEG and LTM EEG were ordered by neurology.  He was loaded with Keppra 2000 mg IV once.  Continued on Depakote 500 mg 3 times daily, Keppra 500 mg twice daily, and Klonopin 0.25 mg 3 times daily.  He was also started on dexamethasone 4 mg every 6 hours IV.  Patient's EEG revealed epileptiform discharge in the right posterior quadrant.    Seen by neuro-oncology.  Neurology following.  02/18/19: Patient was seen and examined at his bedside this morning.  Lethargic and not following commands. LTM EEG discontinued.  PT OT consulted.   Assessment/Plan: Active Problems:   Seizure ([facility])   Goals of care, counseling/discussion   Palliative care by specialist  New onset seizure likely secondary to brain mass possibly GBM  Reviewed MRI done on admission which showed R posterior brain mass Seen by neuro-oncology.  Recommendations for repeat MRI in about a week if the  patient improves considerably Neurology following, antiepileptic medication adjusted Continue close monitoring with seizure precaution Continue Decadron, Keppra 1500 mg twice daily Neuro stopped Vimpat at 100 mg twice daily Klonopin being held due to somnolence Valproic acid 500 mg 3 times daily C/w IV Dilantin load 100 mg 3 times daily LTM EEG discontinued on 02/18/2019  Persistent acute metabolic encephalopathy most likely multifactorial secondary to brain mass with suspicion for GBM versus acute illness in the setting of dementia Treat underlying conditions Neuro oncology consulted Dr. Mickeal Skinner will see the patient. Monitor electrolytes  BMP, mg2+ and phos in good ranges. PT OT to assess  Acute dehydration in the setting of persistent acute metabolic encephalopathy Has not been able to take in po due to somnolence Continue gentle IV fluid hydration LR at 50cc/hr Closely monitor volume status and urine output  Type 2 diabetes with steroid-induced hyperglycemia Hemoglobin A1c 6.5 on 01/31/2019 Continue insulin sliding scale Continue to avoid hypoglycemia  Uncontrolled hypertension Blood pressure not at goal Start scheduled IV labetalol 5 mg 3 times daily Continue to closely monitor vital signs  History of CHF/CAD/PAD: Stable no cardiopulmonary issues. -Resume home meds when able  History of prostate cancer s/p status post radiation in 2011 in remission. -Outpatient follow-up with urologist  DVT prophylaxis:  Subcu Lovenox daily Code Status: Full code Family Communication: Patient and/or RN. Available if any question. Disposition Plan: Remains inpatient Consultants: Neurology, neuro-oncology  Procedures:  8/27-EEG-epileptiform discharge from posterior quadrant 8/27-LVM EEG ongoing.  Microbiology summarized: None    Objective: Vitals:   02/18/19 0900 02/18/19 0930 02/18/19 1108 02/18/19 1125  BP:   (!) 144/103  Pulse: 94 91 96 100  Resp: (!) 21 20 (!) 30 19   Temp:   98.9 F (37.2 C)   TempSrc:   Axillary   SpO2: 97% 95% 97% 97%  Weight:      Height:        Intake/Output Summary (Last 24 hours) at 02/18/2019 1541 Last data filed at 02/18/2019 1120 Gross per 24 hour  Intake 150 ml  Output 1375 ml  Net -1225 ml   Filed Weights   02/17/19 1257  Weight: 103.6 kg    Exam:  . General: 85 y.o. year-old male well-developed well-nourished in no acute distress.  Lethargic and not following commands.   . Cardiovascular: Regular rate and rhythm no rubs or gallops connectivity on telemetry megaly noted.   Marland Kitchen Respiratory: Clear auscultation no wheezes or rales.  Poor inspiratory effort.  . Musculoskeletal: No lower extremity edema.  Peripheral pulses in all 4 extremities. Marland Kitchen Psychiatry: Unable to assess mood due to lethargy.  Data Reviewed: CBC: Recent Labs  Lab 01/22/2019 0540 02/13/19 0101 02/16/19 0727  WBC 12.2* 8.7 10.0  NEUTROABS  --  7.7  --   HGB 15.5 16.3 18.2*  HCT 47.8 48.7 56.2*  MCV 95.2 93.1 95.7  PLT 185 194 0000000   Basic Metabolic Panel: Recent Labs  Lab 02/13/19 0101 02/13/19 0656 02/16/19 0727 02/17/19 0634 02/18/19 0619  NA 136 136 142 143 144  K 4.5 4.4 4.7 4.8 4.8  CL 100 101 104 106 102  CO2 22 21* 22 24 24   GLUCOSE 173* 177* 206* 204* 178*  BUN 20 21 43* 39* 38*  CREATININE 1.08 1.06 1.26* 1.25* 1.12  CALCIUM 9.7 9.8 9.0 9.0 9.4  MG 2.0  --  2.8*  --   --   PHOS 3.8  --  4.6  --   --    GFR: Estimated Creatinine Clearance: 60.1 mL/min (by C-G formula based on SCr of 1.12 mg/dL). Liver Function Tests: Recent Labs  Lab 02/13/19 0101 02/13/19 0656  AST  --  64*  ALT  --  26  ALKPHOS  --  71  BILITOT  --  1.2  PROT  --  7.3  ALBUMIN 3.6 3.7   No results for input(s): LIPASE, AMYLASE in the last 168 hours. Recent Labs  Lab 02/15/19 0657 02/16/19 1712  AMMONIA 37* 24   Coagulation Profile: No results for input(s): INR, PROTIME in the last 168 hours. Cardiac Enzymes: No results for input(s):  CKTOTAL, CKMB, CKMBINDEX, TROPONINI in the last 168 hours. BNP (last 3 results) No results for input(s): PROBNP in the last 8760 hours. HbA1C: No results for input(s): HGBA1C in the last 72 hours. CBG: Recent Labs  Lab 02/17/19 1242 02/17/19 1638 02/17/19 2159 02/18/19 0729 02/18/19 1113  GLUCAP 169* 155* 159* 216* 257*   Lipid Profile: No results for input(s): CHOL, HDL, LDLCALC, TRIG, CHOLHDL, LDLDIRECT in the last 72 hours. Thyroid Function Tests: No results for input(s): TSH, T4TOTAL, FREET4, T3FREE, THYROIDAB in the last 72 hours. Anemia Panel: No results for input(s): VITAMINB12, FOLATE, FERRITIN, TIBC, IRON, RETICCTPCT in the last 72 hours. Urine analysis:    Component Value Date/Time   COLORURINE YELLOW (A) 02/11/2019 1340   APPEARANCEUR CLEAR (A) 02/11/2019 1340   APPEARANCEUR Clear 01/19/2013 1937   LABSPEC 1.019 02/11/2019 1340   LABSPEC 1.017 01/19/2013 1937   PHURINE 5.0 02/11/2019 1340   GLUCOSEU NEGATIVE 02/11/2019 1340   GLUCOSEU Negative 01/19/2013 1937   HGBUR NEGATIVE 02/11/2019 1340  BILIRUBINUR NEGATIVE 02/11/2019 1340   BILIRUBINUR Negative 01/19/2013 1937   KETONESUR 5 (A) 02/11/2019 [place] 02/11/2019 1340   NITRITE NEGATIVE 02/11/2019 1340   LEUKOCYTESUR NEGATIVE 02/11/2019 1340   LEUKOCYTESUR Negative 01/19/2013 1937   Sepsis Labs: @LABRCNTIP (procalcitonin:4,lacticidven:4)  ) Recent Results (from the past 240 hour(s))  Novel Coronavirus, NAA (Hosp order, Send-out to Ref Lab; TAT 18-24 hrs     Status: None   Collection Time: 02/11/19  1:40 PM   Specimen: Nasopharyngeal Swab; Respiratory  Result Value Ref Range Status   SARS-CoV-2, NAA NOT DETECTED NOT DETECTED Final    Comment: (NOTE) This test was developed and its performance characteristics determined by [employer]. This test has not been FDA cleared or approved. This test has been authorized by FDA under an Emergency Use Authorization (EUA). This test is  only authorized for the duration of time the declaration that circumstances exist justifying the authorization of the emergency use of in vitro diagnostic tests for detection of SARS-CoV-2 virus and/or diagnosis of COVID-19 infection under section 564(b)(1) of the Act, 21 U.S.C. KA:123727), unless the authorization is terminated or revoked sooner. When diagnostic testing is negative, the possibility of a false negative result should be considered in the context of a patient's recent exposures and the presence of clinical signs and symptoms consistent with COVID-19. An individual without symptoms of COVID-19 and who is not shedding SARS-CoV-2 virus would expect to have a negative (not detected) result in this assay. Performed  At: [facility] [address] [place], [place] [id number] Rush Farmer MD [id number]    [place]  Final    Comment: Performed at [facility], [address] [place], [place] [address]  MRSA PCR Screening     Status: None   Collection Time: 02/13/19 12:23 AM   Specimen: Nasal Mucosa; Nasopharyngeal  Result Value Ref Range Status   MRSA by PCR NEGATIVE NEGATIVE Final    Comment:        The GeneXpert MRSA Assay (FDA approved for NASAL specimens only), is one component of a comprehensive MRSA colonization surveillance program. It is not intended to diagnose MRSA infection nor to guide or monitor treatment for MRSA infections. Performed at [facility], [place] [address] [place], [place] [address]       Studies: No results found.  Scheduled Meds: . dexamethasone (DECADRON) injection  4 mg Intravenous Q6H  . enoxaparin (LOVENOX) injection  40 mg Subcutaneous Q24H  . insulin aspart  0-5 Units Subcutaneous QHS  . insulin aspart  0-9 Units Subcutaneous TID WC  . phenytoin (DILANTIN) IV  100 mg Intravenous Q8H    Continuous Infusions: . levETIRAcetam 1,500 mg (02/18/19 0947)  . valproate sodium  500 mg (02/18/19 1037)     LOS: 6 days     Kayleen Memos, MD [facility] Pager [phone]  If 7PM-7AM, please contact night-coverage [email or web address] Password [email or web address] 02/18/2019, 3:41 PM

## 2019-02-18 NOTE — Procedures (Addendum)
Patient Name:[name] [id number] Epilepsy Attending:Marceline Napierala Barbra Sarks Referring Physician/Provider:Dr Dana Allan Duration:8/31/20202309 to 02/17/2019 2309  [name] with seizure like activity. EEG to evaluate for seizure.  Level of alertness:awake, asleep  AEDs during EEG study:VPA, LEV,PHT,clonazepam  Technical aspects: This EEG study was done with scalp electrodes positioned according to the 10-20 International system of electrode placement. Electrical activity was acquired at a sampling rate of 500Hz  and reviewed with a high frequency filter of 70Hz  and a low frequency filter of 1Hz . EEG data were recorded continuously and digitally stored.  DESCRIPTION:EEG showed continuous generalized 3 to 6 Hz theta delta slowing, maximal in right posterior quadrant.There were alsosharp waves in right posterior quadrant which were at the rate of 1Hz  in the beginning and were noted be gradually becoming more PLED like at the rate of 1.5Hz . No clear seizures were seen.Hyperventilation and photic stimulation were not performed.  IMPRESSION: This study issuggestive of epileptogenicity in right posterior quadrant as well as cortical dysfunction due to underlying structural abnormality in the same region.There was also evidence of moderate to severe diffuse encephalopathy. No clear seizures were seen on EEG. Clinical correlation is recommended.

## 2019-02-18 NOTE — Progress Notes (Signed)
Daily Progress Note   Patient Name: Darrell Clark       Date: 02/18/2019 DOB: 11-Dec-1932  Age: 85 y.o. MRN#: [id number] Attending Physician: Kayleen Memos, DO Primary Care Physician: Maryland Pink, MD Admit Date: 02/06/2019  Reason for Consultation/Follow-up: Establishing goals of care  Subjective: Spoke with RN - patient not following commands, only responds to pain  Length of Stay: 6  Current Medications: Scheduled Meds:  . dexamethasone (DECADRON) injection  4 mg Intravenous Q6H  . enoxaparin (LOVENOX) injection  40 mg Subcutaneous Q24H  . insulin aspart  0-5 Units Subcutaneous QHS  . insulin aspart  0-9 Units Subcutaneous TID WC  . phenytoin (DILANTIN) IV  100 mg Intravenous Q8H    Continuous Infusions: . levETIRAcetam 1,500 mg (02/18/19 0947)  . valproate sodium 500 mg (02/18/19 1037)    PRN Meds: clonazepam, labetalol, LORazepam        Vital Signs: BP (!) 144/103 (BP Location: Right Arm)   Pulse 100   Temp 98.9 F (37.2 C) (Axillary)   Resp 19   Ht 5\' 11"  (1.803 m)   Wt 103.6 kg   SpO2 97%   BMI 31.85 kg/m  SpO2: SpO2: 97 % O2 Device: O2 Device: Nasal Cannula O2 Flow Rate: O2 Flow Rate (L/min): 2 L/min  Intake/output summary:   Intake/Output Summary (Last 24 hours) at 02/18/2019 1450 Last data filed at 02/18/2019 1120 Gross per 24 hour  Intake 238.59 ml  Output 1375 ml  Net -1136.41 ml   LBM: Last BM Date: 02/18/19 Baseline Weight: Weight: 103.6 kg Most recent weight: Weight: 103.6 kg       Palliative Assessment/Data:PPS 10%    Flowsheet Rows     Most Recent Value  Intake Tab  Referral Department  Hospitalist  Unit at Time of Referral  Intermediate Care Unit  Palliative Care Primary Diagnosis  Neurology  Date Notified  02/16/19  Palliative Care Type  New  Palliative care  Reason for referral  Clarify Goals of Care  Date of Admission  01/20/2019  Date first seen by Palliative Care  02/17/19  # of days Palliative referral response time  1 Day(s)  # of days IP prior to Palliative referral  4  Clinical Assessment  Palliative Performance Scale Score  10%  Psychosocial & Spiritual Assessment  Palliative Care Outcomes  Patient/Family meeting held?  Yes  Who was at the meeting?  son  Palliative Care Outcomes  Clarified goals of care, Counseled regarding hospice, Provided advance care planning, Provided psychosocial or spiritual support      Patient Active Problem List   Diagnosis Date Noted  . Goals of care, counseling/discussion   . Palliative care by specialist   . 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 Seizure ([place]) 02/06/2019  . Myoclonic jerking 02/11/2019  . Ascending aortic aneurysm ([place]) 07/16/2017  . Primary osteoarthritis 06/13/2017  . Acute postoperative pain of left hip 05/13/2017  . Status post total replacement of hip 05/06/2017  . Lumbar radiculopathy, acute 01/01/2017  . Generalized weakness 01/01/2017  . Current moderate episode of major depressive disorder without prior episode ([place]) 12/18/2016  . Major depression in remission ([place]) 12/18/2016  . Dizziness 12/13/2016  . Inguinal  hernia of left side without obstruction or gangrene 09/13/2016  . SOBOE (shortness of breath on exertion) 08/03/2016  . Bilateral carotid artery stenosis 08/03/2016  . Vision, loss, sudden 04/16/2016  . 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 Intracranial vascular stenosis 04/16/2016  . CVA (cerebral vascular accident) ([place]) 04/15/2016  . HTN (hypertension) 04/15/2016  . Diabetes ([place]) 04/15/2016  . GERD (gastroesophageal reflux disease) 04/15/2016  . HLD (hyperlipidemia) 04/15/2016  . Primary osteoarthritis of both hips 09/24/2015  . Moderate tricuspid insufficiency 01/10/2015  . Moderate  mitral insufficiency 08/31/2014  . Mild aortic valve stenosis 08/31/2014  . CAD (coronary artery disease) 08/17/2014  . Spermatocele 04/21/2012  . Sciatica 04/21/2012  . Personal history of prostate cancer 04/21/2012  . Increased frequency of urination 04/21/2012  . Incomplete emptying of bladder 04/21/2012  . Encysted hydrocele 04/21/2012  . ED (erectile dysfunction) of organic origin 04/21/2012    Palliative Care Assessment & Plan   HPI: 85 y.o. male  with past medical history of dementia, stroke, PVD, prostate cancer, HTN, HLD, T2DM, CAD, and CHF admitted on 02/10/2019 with seizures.  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 Seizures have resolved, but remains altered - neuro starting to lower AEDs. PMT consulted for [place].   Assessment: Follow up today with patient's son. He is sitting at patient's bedside and has been for a few hours. He expresses concern that patient's mental status is worse today than yesterday. Gerald Stabs understands current illness and poor prognosis. He remains hopeful for some improvement in mental status - ability to converse with his father. He is also realistic and tells me his desire for truthful information.   We review options for care moving forward - Gerald Stabs shares that he is not interested in SNF placement as he understands he could not visit his father. We discussed hospice facility vs hospice at home. He is interested in either option - if some improvement in mental status hopeful to take him home but if not would be agreeable to hospice facility in [place].   Did not discuss code status today as this was discussed at length yesterday and Gerald Stabs requested a little time prior to making this decision.  Gerald Stabs tells me he reviewed our conversation from yesterday with his sister and also provided her with some information about glioblastoma. He continues to ask to be main point of contact with medical communication and he will update his sister. Again, he is listed  first on HCPOA document, followed by sister.   For now continue watchful waiting but son understands severity of illness and poor prognosis. Hopeful for some neuro recovery to interact with his father.   Recommendations/Plan:  Son, Gerald Stabs, is Media planner and with good understanding of situation  Open to hospice at discharge - either hospice facility or hospice care at home depending on patient's mental status - Gerald Stabs remains hopeful for some recovery in mental status  Not ready to make code status decision - asks for some time to see how his father does  Code Status:  Full code  Prognosis:   < 6 months - potentially much less w/o improvement in mental status  Discharge Planning:  To Be Determined  Care plan was discussed with RN and son, Gerald Stabs  Thank you for allowing the Palliative Medicine Team to assist in the care of this patient.   Total Time 25 minutes Prolonged Time Billed  no    The above conversation was completed via telephone due  to the visitor restrictions during the COVID-19 pandemic. Thorough chart review and discussion with necessary members of the care team was completed as part of assessment. All issues were discussed and addressed but no physical exam was performed.     Greater than 50%  of this time was spent counseling and coordinating care related to the above assessment and plan.  Juel Burrow, DNP, [facility] Palliative Medicine  Team Team Phone # [phone]  Pager [phone]

## 2019-02-19 ENCOUNTER — Inpatient Hospital Stay (HOSPITAL_COMMUNITY): Payer: Medicare Other

## 2019-02-19 DIAGNOSIS — G9340 Encephalopathy, unspecified: Secondary | ICD-10-CM

## 2019-02-19 DIAGNOSIS — J9601 Acute respiratory failure with hypoxia: Secondary | ICD-10-CM

## 2019-02-19 DIAGNOSIS — Z515 Encounter for palliative care: Secondary | ICD-10-CM

## 2019-02-19 LAB — COMPREHENSIVE METABOLIC PANEL
ALT: 35 U/L (ref 0–44)
AST: 33 U/L (ref 15–41)
Albumin: 3.6 g/dL (ref 3.5–5.0)
Alkaline Phosphatase: 70 U/L (ref 38–126)
Anion gap: 23 — ABNORMAL HIGH (ref 5–15)
BUN: 65 mg/dL — ABNORMAL HIGH (ref 8–23)
CO2: 23 mmol/L (ref 22–32)
Calcium: 9.7 mg/dL (ref 8.9–10.3)
Chloride: 105 mmol/L (ref 98–111)
Creatinine, Ser: 2.21 mg/dL — ABNORMAL HIGH (ref 0.61–1.24)
GFR calc Af Amer: 31 mL/min — ABNORMAL LOW (ref >60)
GFR calc non Af Amer: 26 mL/min — ABNORMAL LOW (ref >60)
Glucose, Bld: 227 mg/dL — ABNORMAL HIGH (ref 70–99)
Potassium: 4.7 mmol/L (ref 3.5–5.1)
Sodium: 151 mmol/L — ABNORMAL HIGH (ref 135–145)
Total Bilirubin: 2.1 mg/dL — ABNORMAL HIGH (ref 0.3–1.2)
Total Protein: 7.6 g/dL (ref 6.5–8.1)

## 2019-02-19 LAB — CBC WITH DIFFERENTIAL/PLATELET
Abs Immature Granulocytes: 0.22 10*3/uL — ABNORMAL HIGH (ref 0.00–0.07)
Basophils Absolute: 0.1 10*3/uL (ref 0.0–0.1)
Basophils Relative: 1 %
Eosinophils Absolute: 0.1 10*3/uL (ref 0.0–0.5)
Eosinophils Relative: 0 %
HCT: 71 % — ABNORMAL HIGH (ref 39.0–52.0)
Hemoglobin: 22.6 g/dL (ref 13.0–17.0)
Immature Granulocytes: 1 %
Lymphocytes Relative: 5 %
Lymphs Abs: 0.9 10*3/uL (ref 0.7–4.0)
MCH: 31.3 pg (ref 26.0–34.0)
MCHC: 31.8 g/dL (ref 30.0–36.0)
MCV: 98.2 fL (ref 80.0–100.0)
Monocytes Absolute: 1.7 10*3/uL — ABNORMAL HIGH (ref 0.1–1.0)
Monocytes Relative: 10 %
Neutro Abs: 14.2 10*3/uL — ABNORMAL HIGH (ref 1.7–7.7)
Neutrophils Relative %: 83 %
Platelets: 160 10*3/uL (ref 150–400)
RBC: 7.23 MIL/uL — ABNORMAL HIGH (ref 4.22–5.81)
RDW: 15.9 % — ABNORMAL HIGH (ref 11.5–15.5)
WBC: 17.1 10*3/uL — ABNORMAL HIGH (ref 4.0–10.5)
nRBC: 0.2 % (ref 0.0–0.2)

## 2019-02-19 LAB — PROCALCITONIN: Procalcitonin: 0.53 ng/mL

## 2019-02-19 LAB — LACTIC ACID, PLASMA: Lactic Acid, Venous: 6.1 mmol/L (ref 0.5–1.9)

## 2019-02-19 LAB — BLOOD GAS, ARTERIAL
Acid-Base Excess: 0.7 mmol/L (ref 0.0–2.0)
Bicarbonate: 23.5 mmol/L (ref 20.0–28.0)
Drawn by: 533091
O2 Content: 3 L/min
O2 Saturation: 96.2 %
Patient temperature: 98.6
pCO2 arterial: 29.4 mmHg — ABNORMAL LOW (ref 32.0–48.0)
pH, Arterial: 7.513 — ABNORMAL HIGH (ref 7.350–7.450)
pO2, Arterial: 76.2 mmHg — ABNORMAL LOW (ref 83.0–108.0)

## 2019-02-19 LAB — MAGNESIUM: Magnesium: 3.4 mg/dL — ABNORMAL HIGH (ref 1.7–2.4)

## 2019-02-19 LAB — GLUCOSE, CAPILLARY
Glucose-Capillary: 232 mg/dL — ABNORMAL HIGH (ref 70–99)
Glucose-Capillary: 207 mg/dL — ABNORMAL HIGH (ref 70–99)

## 2019-02-19 LAB — PHOSPHORUS: Phosphorus: 6.2 mg/dL — ABNORMAL HIGH (ref 2.5–4.6)

## 2019-02-19 MED ORDER — SCOPOLAMINE 1 MG/3DAYS TD PT72
1.0000 | MEDICATED_PATCH | TRANSDERMAL | Status: DC
  Administered 2019-02-19: 1.5 mg via TRANSDERMAL
  Filled 2019-02-19: qty 1

## 2019-02-19 MED ORDER — HYDROMORPHONE HCL 1 MG/ML IJ SOLN: 1.0000 mg | INTRAMUSCULAR | Status: DC | PRN

## 2019-02-19 MED ORDER — HYDROMORPHONE HCL 1 MG/ML IJ SOLN: 0.5000 mg | INTRAMUSCULAR | Status: DC | PRN

## 2019-02-19 MED ORDER — MORPHINE SULFATE (PF) 2 MG/ML IV SOLN
2.0000 mg | INTRAVENOUS | Status: DC | PRN
  Administered 2019-02-19: 12:00:00 2 mg via INTRAVENOUS
  Filled 2019-02-19: qty 1

## 2019-02-19 MED ORDER — SODIUM CHLORIDE 0.9 % IV SOLN
1.0000 mg/h | INTRAVENOUS | Status: DC
  Filled 2019-02-19: qty 5

## 2019-02-19 MED ORDER — HYDROMORPHONE BOLUS VIA INFUSION
1.0000 mg | INTRAVENOUS | Status: DC | PRN
  Filled 2019-02-19: qty 1

## 2019-02-19 MED ORDER — LORAZEPAM 2 MG/ML IJ SOLN: 1.0000 mg | INTRAMUSCULAR | Status: DC | PRN

## 2019-02-19 MED ORDER — MORPHINE SULFATE (PF) 2 MG/ML IV SOLN: 1.0000 mg | INTRAVENOUS | Status: DC | PRN

## 2019-02-19 MED ORDER — GLYCOPYRROLATE 0.2 MG/ML IJ SOLN: 0.4000 mg | Freq: Four times a day (QID) | INTRAMUSCULAR | Status: DC

## 2019-02-19 MED ORDER — GLYCOPYRROLATE 0.2 MG/ML IJ SOLN
0.2000 mg | Freq: Four times a day (QID) | INTRAMUSCULAR | Status: DC
  Filled 2019-02-19: qty 1

## 2019-03-12 ENCOUNTER — Ambulatory Visit: Payer: Self-pay | Admitting: Urology

## 2019-03-13 ENCOUNTER — Ambulatory Visit: Payer: Self-pay | Admitting: Urology

## 2019-03-19 NOTE — Progress Notes (Signed)
PT Cancellation Note  Patient Details Name: Darrell Clark MRN: [id number] DOB: Jan 31, 1933   Cancelled Treatment:    Reason Eval/Treat Not Completed: Patient not medically ready (dyspneic at rest - respiratory rate up to 57 when sleeping. RN aware).  Ellamae Sia, PT, DPT Acute Rehabilitation Services Pager [phone] Office [phone]    Willy Eddy Feb 26, 2019, 8:15 AM

## 2019-03-19 NOTE — Progress Notes (Addendum)
Inpatient Diabetes Program Recommendations  AACE/ADA: New Consensus Statement on Inpatient Glycemic Control (2015)  Target Ranges:  Prepandial:   less than 140 mg/dL      Peak postprandial:   less than 180 mg/dL (1-2 hours)      Critically ill patients:  140 - 180 mg/dL   Lab Results  Component Value Date   GLUCAP 232 (H) 02-23-2019   HGBA1C 6.5 (H) 02/15/2019    Review of Glycemic Control  Diabetes history: DM 2 Outpatient Diabetes medications: None Current orders for Inpatient glycemic control:  Novolog 0-9 units tid Novolog 0-5 units qhs  Decadron 4 mg Q6 hours  A1c 6.5% on 8/27  Inpatient Diabetes Program Recommendations:    Glucose trends in 200's. Palliative involved.  If in plan of care, consider starting Lantus 8 units. Will need to titrate when steroids are decreased.  Noted poor PO intake. Poor prognosis.  Thanks,  Tama Headings RN, MSN, BC-ADM Inpatient Diabetes Coordinator Team Pager [phone] (8a-5p)

## 2019-03-19 NOTE — Progress Notes (Addendum)
Daily Progress Note   Patient Name: Darrell Clark       Date: 2019/02/28 DOB: 11-24-1932  Age: 85 y.o. MRN#: [id number] Attending Physician: Kayleen Memos, DO Primary Care Physician: Maryland Pink, MD Admit Date: 02/03/2019  Reason for Consultation/Follow-up: Establishing goals of care  Subjective: Spoke with RN - patient appears to be actively dying, tachypneic, unresponsive, increased HR - family has spoken with CCM this morning regarding intubation and elected comfort care  Length of Stay: 7  Current Medications: Scheduled Meds:  . dexamethasone (DECADRON) injection  4 mg Intravenous Q6H  . enoxaparin (LOVENOX) injection  40 mg Subcutaneous Q24H  . glycopyrrolate  0.2 mg Intravenous QID  . insulin aspart  0-5 Units Subcutaneous QHS  . insulin aspart  0-9 Units Subcutaneous TID WC  . labetalol  5 mg Intravenous Q8H  . phenytoin (DILANTIN) IV  100 mg Intravenous Q8H  . scopolamine  1 patch Transdermal Q72H    Continuous Infusions: . levETIRAcetam 1,500 mg (02-28-19 1101)    PRN Meds: clonazepam, LORazepam, morphine injection        Vital Signs: BP (!) 126/99 (BP Location: Right Arm)   Pulse (!) 33   Temp (!) 100.9 F (38.3 C) (Axillary)   Resp (!) 29   Ht 5\' 11"  (1.803 m)   Wt 103.6 kg   SpO2 93%   BMI 31.85 kg/m  SpO2: SpO2: 93 % O2 Device: O2 Device: Nasal Cannula O2 Flow Rate: O2 Flow Rate (L/min): 2 L/min  Intake/output summary:   Intake/Output Summary (Last 24 hours) at 2019/02/28 1316 Last data filed at 02-28-2019 0745 Gross per 24 hour  Intake 350 ml  Output 750 ml  Net -400 ml   LBM: Last BM Date: 02/18/19 Baseline Weight: Weight: 103.6 kg Most recent weight: Weight: 103.6 kg       Palliative Assessment/Data:PPS 10%    Flowsheet Rows     Most Recent Value   Intake Tab  Referral Department  Hospitalist  Unit at Time of Referral  Intermediate Care Unit  Palliative Care Primary Diagnosis  Neurology  Date Notified  02/16/19  Palliative Care Type  New Palliative care  Reason for referral  Clarify Goals of Care  Date of Admission  01/29/2019  Date first seen by Palliative Care  02/17/19  # of days Palliative referral response time  1 Day(s)  # of days IP prior to Palliative referral  4  Clinical Assessment  Palliative Performance Scale Score  10%  Psychosocial & Spiritual Assessment  Palliative Care Outcomes  Patient/Family meeting held?  Yes  Who was at the meeting?  son  Palliative Care Outcomes  Clarified goals of care, Counseled regarding hospice, Provided advance care planning, Provided psychosocial or spiritual support      Patient Active Problem List   Diagnosis Date Noted  . Acute hypoxemic respiratory failure ([place])   . Encephalopathy acute   . Goals of care, counseling/discussion   . Palliative care by specialist   . 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 Seizure ([place]) 02/06/2019  . Myoclonic jerking 02/11/2019  . Ascending aortic aneurysm ([place]) 07/16/2017  . Primary osteoarthritis 06/13/2017  . Acute postoperative pain of left hip 05/13/2017  .  Status post total replacement of hip 05/06/2017  . Lumbar radiculopathy, acute 01/01/2017  . Generalized weakness 01/01/2017  . Current moderate episode of major depressive disorder without prior episode ([place]) 12/18/2016  . Major depression in remission ([place]) 12/18/2016  . Dizziness 12/13/2016  . Inguinal hernia of left side without obstruction or gangrene 09/13/2016  . SOBOE (shortness of breath on exertion) 08/03/2016  . Bilateral carotid artery stenosis 08/03/2016  . Vision, loss, sudden 04/16/2016  . 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 Intracranial vascular stenosis 04/16/2016  . CVA (cerebral vascular accident) ([place])  04/15/2016  . HTN (hypertension) 04/15/2016  . Diabetes ([place]) 04/15/2016  . GERD (gastroesophageal reflux disease) 04/15/2016  . HLD (hyperlipidemia) 04/15/2016  . Primary osteoarthritis of both hips 09/24/2015  . Moderate tricuspid insufficiency 01/10/2015  . Moderate mitral insufficiency 08/31/2014  . Mild aortic valve stenosis 08/31/2014  . CAD (coronary artery disease) 08/17/2014  . Spermatocele 04/21/2012  . Sciatica 04/21/2012  . Personal history of prostate cancer 04/21/2012  . Increased frequency of urination 04/21/2012  . Incomplete emptying of bladder 04/21/2012  . Encysted hydrocele 04/21/2012  . ED (erectile dysfunction) of organic origin 04/21/2012    Palliative Care Assessment & Plan   HPI: 85 y.o. male  with past medical history of dementia, stroke, PVD, prostate cancer, HTN, HLD, T2DM, CAD, and CHF admitted on 01/20/2019 with seizures.  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 Seizures have resolved, but remains altered - neuro starting to lower AEDs. PMT consulted for [place].   Assessment: Patient appears to be actively dying and has been transitioned to comfort care this AM, made DNR. Family on their way to hospital.  Reached out to son, Gerald Stabs, to provide support. He has good understanding of this situation - tells me he was hopeful for some meaningful interaction with his father prior to him passing - emotional support provided. Affirmed his decisions to focus on comfort, avoid intubation - we discussed how these interventions would not change the overall outcome. We discussed measures being provided to promote comfort. Gerald Stabs has my number to call with any further questions or concerns.   Addendum: Dilaudid infusion initiated d/t concern of continued dyspnea unmanaged by current PRNs, will avoid morphine in setting of AKI  Recommendations/Plan:  Transitioned to comfort care this AM  Family with good understanding of situation - understand hospital death is likely  PRN  dilaudid, PRN ativan, scheduled robinul  Code Status:  DNR  Prognosis:   Hours - Days   Discharge Planning:  Anticipated Hospital Death  Care plan was discussed with RN and son, Gerald Stabs  Thank you for allowing the Palliative Medicine Team to assist in the care of this patient.   Total Time 25 minutes Prolonged Time Billed  no    The above conversation was completed via telephone due to the visitor restrictions during the COVID-19 pandemic. Thorough chart review and discussion with necessary members of the care team was completed as part of assessment. All issues were discussed and addressed but no physical exam was performed.     Greater than 50%  of this time was spent counseling and coordinating care related to the above assessment and plan.  Juel Burrow, DNP, [facility] Palliative Medicine  Team Team Phone # [phone]  Pager [phone]

## 2019-03-19 NOTE — Progress Notes (Addendum)
PROGRESS NOTE  Darrell Clark [id number] DOB: March 05, 1933 DOA: 01/27/2019 PCP: Maryland Pink, MD  HPI/Recap of past 32 hours: 85 year old male with history of dementia, CHF, CAD, PAD, DM-2, HTN, HLD, prostate cancer and obesity who presented to [facility] on 8/26 with seizure and transferred to: 8/27 for EEG.  Patient was loaded with Depakote 20 mg/KG and continued on 50 mg/KG/day in 3 divided doses at [facility].  CT head w/o contrast at [facility] revealed interval development of ill-defined right-sided subcortical hypodensities concerning for infarction or edema.  MRI brain W/O contrast revealed cortical subcortical edema within right occipital lobe at the right frontoparietal junction concerning for subacute infarction or neoplastic process.  MRI brain W/contrast showed moderate area of enhancement in the right occipital lobe and small areas of enhancement in the right frontoparietal white matter favoring mass especially GBM and a subacute right PCA finding concerning for mass versus infarct.  On arrival here, evaluated by neurology.  Stat EEG and LTM EEG were ordered by neurology.  He was loaded with Keppra 2000 mg IV once.  Continued on Depakote 500 mg 3 times daily, Keppra 500 mg twice daily, and Klonopin 0.25 mg 3 times daily.  He was also started on dexamethasone 4 mg every 6 hours IV.  Patient's EEG revealed epileptiform discharge in the right posterior quadrant.    Seen by neuro-oncology.  Neurology following.  LTM EEG dced on 02/18/19.  2019/02/28: Patient seen and examined at his bedside.  He does not respond to painful stimuli and has episodes of apnea.  Tachypneic with respiratory rate up to 53. ABG showed respiratory alkalosis.  Unable to protect his airway per RT. called his son multiple times to give update but no answer.  CCM contacted for possible intubation.    Assessment/Plan: Active Problems:   Seizure ([place])   Goals of care, counseling/discussion   Palliative care by specialist   New onset seizure likely secondary to brain mass possibly GBM  Reviewed MRI done on admission which showed R posterior brain mass Seen by neuro-oncology.  Recommendations for repeat MRI in about a week if the patient improves considerably Neurology following, antiepileptic medication adjusted Continue close monitoring with seizure precaution Continue Decadron, Keppra 1500 mg twice daily Neuro stopped Vimpat at 100 mg twice daily Klonopin being held due to somnolence On Valproic acid 500 mg 3 times daily, IV Dilantin load 100 mg 3 times daily LTM EEG discontinued on 02/18/2019  Severe acute hypoxic respiratory failure with respiratory alkalosis likely secondary to brain mass Chest x-ray done on 28-Feb-2019 unrevealing ABG done on Feb 28, 2019 revealed PH7.513/29/76 on 3L [place] Unable to protect airway per RT CCM consulted for possible intubation  Persistent acute metabolic encephalopathy most likely multifactorial secondary to brain mass with suspicion for GBM versus acute illness in the setting of dementia Very poor prognosis Neuro oncology consulted Dr. Mickeal Skinner  Last BMP, mg2+ and phos in good ranges. Repeat labs.  Acute dehydration in the setting of persistent acute metabolic encephalopathy Has not been able to take in po due to somnolence Continue gentle IV fluid hydration LR at 50cc/hr Closely monitor volume status and urine output  Type 2 diabetes with steroid-induced hyperglycemia Hemoglobin A1c 6.5 on 01/24/2019 Persistent hyperglycemia Increase insulin sliding scale tomorrow Avoid hypoglycemia  Uncontrolled hypertension Blood pressure not at goal Continue scheduled IV labetalol 5 mg 3 times daily Continue to closely monitor vital signs  Sinus tachycardia Obtain twelve-lead EKG Obtain TSH level Continue IV labetalol  History of CHF/CAD/PAD: Stable no  cardiopulmonary issues. -N.p.o. due to lethargy  History of prostate cancer s/p status post radiation in 2011 in remission.  -Outpatient follow-up with urologist  Goals of care Palliative care team following to establish goals of care Currently full code     Risks: Patient is at high risk for decompensation and intubation due to acute respiratory alkalosis and inability to protect airway.  Very poor prognosis in the setting of presumed glioblastoma.    DVT prophylaxis:  Subcu Lovenox daily Code Status: Full code Family Communication:  Called his son multiple times, no answer. Disposition Plan:  Undetermined at this time. Consultants: Neurology, neuro-oncology  Procedures:  8/27-EEG-epileptiform discharge from posterior quadrant 8/27-LVM EEG ongoing.  Microbiology summarized: None    Objective: Vitals:   02/18/19 2338 16-Mar-2019 0321 2019-03-16 0735 2019-03-16 0741  BP:   (!) 126/99   Pulse:   (!) 105 (!) 33  Resp:   (!) 46 (!) 29  Temp: 98.8 F (37.1 C) 98.4 F (36.9 C) 97.6 F (36.4 C)   TempSrc: Oral Oral Oral   SpO2:   95% 93%  Weight:      Height:        Intake/Output Summary (Last 24 hours) at 2019/03/16 [id number] Last data filed at 16-Mar-2019 0745 Gross per 24 hour  Intake 400 ml  Output 1000 ml  Net -600 ml   Filed Weights   02/17/19 1257  Weight: 103.6 kg    Exam:  . General: 85 y.o. year-old male developed well-nourished.  Lethargic.  Does not respond to stimuli. . Cardiovascular: Tachycardic no rubs or gallops no JVD or thyromegaly. Marland Kitchen Respiratory: Mild rales at bases.  Poor inspiratory effort.  Periods of apnea noted. . Musculoskeletal: No lower extremity edema.  Peripheral pulses 2 out of 4. . Psychiatry: Unable to assess mood due to lethargy.  Data Reviewed: CBC: Recent Labs  Lab 02/13/19 0101 02/16/19 0727  WBC 8.7 10.0  NEUTROABS 7.7  --   HGB 16.3 18.2*  HCT 48.7 56.2*  MCV 93.1 95.7  PLT 194 0000000   Basic Metabolic Panel: Recent Labs  Lab 02/13/19 0101 02/13/19 0656 02/16/19 0727 02/17/19 0634 02/18/19 0619  NA 136 136 142 143 144  K 4.5 4.4 4.7 4.8  4.8  CL 100 101 104 106 102  CO2 22 21* 22 24 24   GLUCOSE 173* 177* 206* 204* 178*  BUN 20 21 43* 39* 38*  CREATININE 1.08 1.06 1.26* 1.25* 1.12  CALCIUM 9.7 9.8 9.0 9.0 9.4  MG 2.0  --  2.8*  --   --   PHOS 3.8  --  4.6  --   --    GFR: Estimated Creatinine Clearance: 60.1 mL/min (by C-G formula based on SCr of 1.12 mg/dL). Liver Function Tests: Recent Labs  Lab 02/13/19 0101 02/13/19 0656  AST  --  64*  ALT  --  26  ALKPHOS  --  71  BILITOT  --  1.2  PROT  --  7.3  ALBUMIN 3.6 3.7   No results for input(s): LIPASE, AMYLASE in the last 168 hours. Recent Labs  Lab 02/15/19 0657 02/16/19 1712  AMMONIA 37* 24   Coagulation Profile: No results for input(s): INR, PROTIME in the last 168 hours. Cardiac Enzymes: No results for input(s): CKTOTAL, CKMB, CKMBINDEX, TROPONINI in the last 168 hours. BNP (last 3 results) No results for input(s): PROBNP in the last 8760 hours. HbA1C: No results for input(s): HGBA1C in the last 72 hours. CBG: Recent Labs  Lab  02/18/19 0729 02/18/19 1113 02/18/19 1756 02/18/19 2144 15-Mar-2019 0738  GLUCAP 216* 257* 198* 243* 232*   Lipid Profile: No results for input(s): CHOL, HDL, LDLCALC, TRIG, CHOLHDL, LDLDIRECT in the last 72 hours. Thyroid Function Tests: No results for input(s): TSH, T4TOTAL, FREET4, T3FREE, THYROIDAB in the last 72 hours. Anemia Panel: No results for input(s): VITAMINB12, FOLATE, FERRITIN, TIBC, IRON, RETICCTPCT in the last 72 hours. Urine analysis:    Component Value Date/Time   COLORURINE YELLOW (A) 02/11/2019 1340   APPEARANCEUR CLEAR (A) 02/11/2019 1340   APPEARANCEUR Clear 01/19/2013 1937   LABSPEC 1.019 02/11/2019 1340   LABSPEC 1.017 01/19/2013 1937   PHURINE 5.0 02/11/2019 1340   GLUCOSEU NEGATIVE 02/11/2019 1340   GLUCOSEU Negative 01/19/2013 1937   HGBUR NEGATIVE 02/11/2019 1340   BILIRUBINUR NEGATIVE 02/11/2019 1340   BILIRUBINUR Negative 01/19/2013 1937   KETONESUR 5 (A) 02/11/2019 1340    PROTEINUR NEGATIVE 02/11/2019 1340   NITRITE NEGATIVE 02/11/2019 1340   LEUKOCYTESUR NEGATIVE 02/11/2019 1340   LEUKOCYTESUR Negative 01/19/2013 1937   Sepsis Labs: @LABRCNTIP (procalcitonin:4,lacticidven:4)  ) Recent Results (from the past 240 hour(s))  Novel Coronavirus, NAA (Hosp order, Send-out to Ref Lab; TAT 18-24 hrs     Status: None   Collection Time: 02/11/19  1:40 PM   Specimen: Nasopharyngeal Swab; Respiratory  Result Value Ref Range Status   SARS-CoV-2, NAA NOT DETECTED NOT DETECTED Final    Comment: (NOTE) This test was developed and its performance characteristics determined by [employer]. This test has not been FDA cleared or approved. This test has been authorized by FDA under an Emergency Use Authorization (EUA). This test is only authorized for the duration of time the declaration that circumstances exist justifying the authorization of the emergency use of in vitro diagnostic tests for detection of SARS-CoV-2 virus and/or diagnosis of COVID-19 infection under section 564(b)(1) of the Act, 21 U.S.C. KA:123727), unless the authorization is terminated or revoked sooner. When diagnostic testing is negative, the possibility of a false negative result should be considered in the context of a patient's recent exposures and the presence of clinical signs and symptoms consistent with COVID-19. An individual without symptoms of COVID-19 and who is not shedding SARS-CoV-2 virus would expect to have a negative (not detected) result in this assay. Performed  At: [facility] [address] [place], [place] [id number] Rush Farmer MD [id number]    [place]  Final    Comment: Performed at [facility], [address] [place], [place] [address]  MRSA PCR Screening     Status: None   Collection Time: 02/13/19 12:23 AM   Specimen: Nasal Mucosa; Nasopharyngeal  Result Value Ref Range Status   MRSA by PCR NEGATIVE  NEGATIVE Final    Comment:        The GeneXpert MRSA Assay (FDA approved for NASAL specimens only), is one component of a comprehensive MRSA colonization surveillance program. It is not intended to diagnose MRSA infection nor to guide or monitor treatment for MRSA infections. Performed at [facility], [place] [address], [place],  [address]       Studies: Dg Chest Port 1 View  Result Date: 03-15-19 CLINICAL DATA:  Abnormal respirations, history coronary artery disease, CHF, diabetes mellitus, GERD, hypertension, prostate cancer EXAM: PORTABLE CHEST 1 VIEW COMPARISON:  Portable exam 0853 hours compared to 07/12/2018 FINDINGS: Upper normal heart size. Mediastinal contours and pulmonary vascularity normal. Atherosclerotic calcification aorta. Bibasilar atelectasis. Remaining lungs clear. No pleural effusion or  pneumothorax. Bones unremarkable. Slight rotation to the LEFT. IMPRESSION: Bibasilar atelectasis. Electronically Signed   By: Lavonia Dana M.D.   On: 2019-03-12 08:59    Scheduled Meds: . dexamethasone (DECADRON) injection  4 mg Intravenous Q6H  . enoxaparin (LOVENOX) injection  40 mg Subcutaneous Q24H  . insulin aspart  0-5 Units Subcutaneous QHS  . insulin aspart  0-9 Units Subcutaneous TID WC  . labetalol  5 mg Intravenous Q8H  . phenytoin (DILANTIN) IV  100 mg Intravenous Q8H    Continuous Infusions: . levETIRAcetam 1,500 mg (02/18/19 2253)  . valproate sodium 500 mg (02/18/19 2245)     LOS: 7 days     Kayleen Memos, MD [facility] Pager [phone]  If 7PM-7AM, please contact night-coverage [email or web address] Password [facility] 2019-03-12, 9:38 AM

## 2019-03-19 NOTE — Progress Notes (Signed)
   Mar 13, 2019 1430  Clinical Encounter Type  Visited With Patient and family together  Visit Type Death  Referral From Nurse  Consult/Referral To Chaplain  Stress Factors  Family Stress Factors Loss   Chaplain supported the family by being there during end of life.  Chaplain assess that no follow-up is needed.  Brion Aliment Chaplain Resident For questions concerning this note please contact me by pager [phone]

## 2019-03-19 NOTE — Consult Note (Signed)
NAME:  Darrell Clark, MRN:  [id number], DOB:  1933/02/19, LOS: 7 ADMISSION DATE:  02/02/2019, CONSULTATION DATE:  9/3 REFERRING MD:  Dr. Lynetta Mare, CHIEF COMPLAINT:  AMS    History of present illness   85 year old male with past medical history as below, which is significant for dementia, CHF, CAD, diabetes, and prostate cancer.  He presented to [facility] on 8/26 with complaints of seizure and was transferred to [facility] 8/27 for EEG.  He was successfully treated with antiepileptics.  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  Which is thought to be the cause of his focal status epilepticus.  His course has since been complicated for ongoing lethargy which has worsened over the days preceding our consultation.  In the morning hours of 9/3 he was noted to be essentially obtunded with no response to pain and concern for poor airway protection.  PCCM was consulted for potential intubation.  Of note the patient has been followed by palliative care medicine for several days given his poor prognosis.  There were plans to transfer to hospice facility if he was able to improve from mental status standpoint.  Past Medical History   has a past medical history of Acquired cyst of kidney, Allergy, Anxiety, Arthritis, Atherosclerosis, BPH (benign prostatic hyperplasia), CAD (coronary artery disease), Cardiac arrhythmia, CHF (congestive heart failure) ([place]), Chronic prostatitis, Degenerative arthritis, Dementia ([place]), Depression, Diabetes mellitus without complication ([place]), Dyspnea, Elevated prostate specific antigen (PSA), Encysted hydrocele, GERD (gastroesophageal reflux disease), Glaucoma (increased eye pressure), Gout, Gross hematuria, Hemorrhoids, HLD (hyperlipidemia), Hyperlipidemia, unspecified, Hypertension, Hypertrophy of breast, Impotence of organic origin, Incomplete bladder emptying, Malignant neoplasm of prostate ([place]),  Osteoarthritis, Other specified disorders of the male genital organs, Prostate cancer ([place]), PVD (peripheral vascular disease) ([place]), Sciatica, Spermatocele, Spinal stenosis of lumbar region with radiculopathy (11/11/2015), Stroke [facility]), Testicular hypofunction, Urinary frequency, Urinary obstruction, and Vertigo.   Significant Hospital Events   8/26 admit 9/3 mental status and respiratory decline. Made DNR  Consults:  Neurology PCCM  Procedures:    Significant Diagnostic Tests:     Objective   Blood pressure (!) 126/99, pulse (!) 33, temperature 97.6 F (36.4 C), temperature source Oral, resp. rate (!) 29, height 5\' 11"  (1.803 m), weight 103.6 kg, SpO2 93 %.       Intake/Output Summary (Last 24 hours) at 03/16/19 1008 Last data filed at 03/16/2019 0745 Gross per 24 hour  Intake 400 ml  Output 1000 ml  Net -600 ml   Filed Weights   02/17/19 1257  Weight: 103.6 kg    Examination: General: Elderly appearing male in respiratory distress HENT: [place]/AT, PERRL, no JVD Lungs: diminished bases.  Cardiovascular: Tachy, Regular, no MRG Abdomen: Soft, non-tender, non-distended Extremities: No acute deformity.  Neuro: Obtunded. No pain response. Gag, corneal intact.   Assessment & Plan:    85 year old male admitted with focal status epilepticus and found to have likely R occipital glioblastoma vs CVA, possible stroke, and cerebral edema. His obtundation has worsened despite de-escalation of sedating med/AEDs. EEG has been negative and been discontinued. At this point he is essentially unresponsive, not protecting his airway, and has almost certainly been aspirating. After review of the medical record and the discussions between the patient's son and the palliative team, it seems as though the patient would not want mechanical ventilation, which would be indicated at this time.   I have had long discussion  with son Darrell Clark, who has relayed information to other family members in  real-time. They have come to the conclusion that we should focus on Darrell Clark comfort at this time and forego life-sustaining measures such as mechanical ventilation.  Acute hypoxemic respiratory failure Focal status epilepticus (resolved) Acute encephalopathy Suspected aspiration Possible GBM vs CVA History of Dementia - DNR - Comfort based care only - Family is coming in to spend time with patient - PRN morphine for pain, work of breathing - Continued care per the hospitalists team - Appreciate palliative medicine team guidance   Labs   CBC: Recent Labs  Lab 02/13/19 0101 02/16/19 0727  WBC 8.7 10.0  NEUTROABS 7.7  --   HGB 16.3 18.2*  HCT 48.7 56.2*  MCV 93.1 95.7  PLT 194 0000000    Basic Metabolic Panel: Recent Labs  Lab 02/13/19 0101 02/13/19 0656 02/16/19 0727 02/17/19 0634 02/18/19 0619  NA 136 136 142 143 144  K 4.5 4.4 4.7 4.8 4.8  CL 100 101 104 106 102  CO2 22 21* 22 24 24   GLUCOSE 173* 177* 206* 204* 178*  BUN 20 21 43* 39* 38*  CREATININE 1.08 1.06 1.26* 1.25* 1.12  CALCIUM 9.7 9.8 9.0 9.0 9.4  MG 2.0  --  2.8*  --   --   PHOS 3.8  --  4.6  --   --    GFR: Estimated Creatinine Clearance: 60.1 mL/min (by C-G formula based on SCr of 1.12 mg/dL). Recent Labs  Lab 02/13/19 0101 02/16/19 0727  WBC 8.7 10.0  LATICACIDVEN 1.7  --     Liver Function Tests: Recent Labs  Lab 02/13/19 0101 02/13/19 0656  AST  --  64*  ALT  --  26  ALKPHOS  --  71  BILITOT  --  1.2  PROT  --  7.3  ALBUMIN 3.6 3.7   No results for input(s): LIPASE, AMYLASE in the last 168 hours. Recent Labs  Lab 02/15/19 0657 02/16/19 1712  AMMONIA 37* 24    ABG    Component Value Date/Time   PHART 7.513 (H) 2019-03-14 0842   PCO2ART 29.4 (L) 03-14-2019 0842   PO2ART 76.2 (L) 2019/03/14 0842   HCO3 23.5 03/14/2019 0842   O2SAT 96.2 2019/03/14 0842     Coagulation Profile: No results for input(s): INR, PROTIME in the last 168 hours.  Cardiac Enzymes: No results  for input(s): CKTOTAL, CKMB, CKMBINDEX, TROPONINI in the last 168 hours.  HbA1C: Hgb A1c MFr Bld  Date/Time Value Ref Range Status  01/19/2019 05:40 AM 6.5 (H) 4.8 - 5.6 % Final    Comment:    (NOTE) Pre diabetes:          5.7%-6.4% Diabetes:              >6.4% Glycemic control for   <7.0% adults with diabetes   02/11/2019 06:34 PM 6.4 (H) 4.8 - 5.6 % Final    Comment:    (NOTE) Pre diabetes:          5.7%-6.4% Diabetes:              >6.4% Glycemic control for   <7.0% adults with diabetes     CBG: Recent Labs  Lab 02/18/19 0729 02/18/19 1113 02/18/19 1756 02/18/19 2144 03-14-2019 0738  GLUCAP 216* 257* 198* 243* 232*    Review of Systems:   Unable as patient is encephalopathic  Past Medical History  He,  has a past medical history of Acquired  cyst of kidney, Allergy, Anxiety, Arthritis, Atherosclerosis, BPH (benign prostatic hyperplasia), CAD (coronary artery disease), Cardiac arrhythmia, CHF (congestive heart failure) ([place]), Chronic prostatitis, Degenerative arthritis, Dementia ([place]), Depression, Diabetes mellitus without complication ([place]), Dyspnea, Elevated prostate specific antigen (PSA), Encysted hydrocele, GERD (gastroesophageal reflux disease), Glaucoma (increased eye pressure), Gout, Gross hematuria, Hemorrhoids, HLD (hyperlipidemia), Hyperlipidemia, unspecified, Hypertension, Hypertrophy of breast, Impotence of organic origin, Incomplete bladder emptying, Malignant neoplasm of prostate ([place]), Osteoarthritis, Other specified disorders of the male genital organs, Prostate cancer ([place]), PVD (peripheral vascular disease) ([place]), Sciatica, Spermatocele, Spinal stenosis of lumbar region with radiculopathy (11/11/2015), Stroke [facility]), Testicular hypofunction, Urinary frequency, Urinary obstruction, and Vertigo.   Surgical History    Past Surgical History:  Procedure Laterality Date   APPENDECTOMY     CARDIAC CATHETERIZATION  11/2013   FIDUCIAL SEED PLACEMENT  10/14/2009    PROSTATE BIOPSY  08/11/2009   RADIATION FOR PROSTATE CANCER     TOTAL HIP ARTHROPLASTY Left 05/06/2017   Procedure: TOTAL HIP ARTHROPLASTY;  Surgeon: Dereck Leep, MD;  Location: [facility] ORS;  Service: Orthopedics;  Laterality: Left;     Social History   reports that he quit smoking about 11 years ago. His smoking use included cigarettes. He has a 50.00 pack-year smoking history. He has never used smokeless tobacco. He reports that he does not drink alcohol or use drugs.   Family History   His family history includes Cancer in his brother; Heart attack in his father and mother; Hypertension in his brother, brother, brother, father, mother, sister, and sister. There is no history of Kidney cancer, Prostate cancer, or GU problems.   Allergies Allergies  Allergen Reactions   Niacin Itching and Rash    Other reaction(s): Unknown Other reaction(s): UNKNOWN   Niacin And Related Rash     Home Medications  Prior to Admission medications   Medication Sig Start Date End Date Taking? Authorizing Provider  acetaminophen (TYLENOL) 500 MG tablet Take 1,000 mg by mouth 2 (two) times daily.     [provider]  amLODipine (NORVASC) 5 MG tablet Take 5 mg by mouth daily.  02/21/13   [provider]  aspirin EC 81 MG EC tablet Take 1 tablet (81 mg total) by mouth daily. 04/16/16   Theodoro Grist, MD  benazepril (LOTENSIN) 20 MG tablet Take 20 mg by mouth daily.     [provider]  brimonidine (ALPHAGAN) 0.2 % ophthalmic solution Place 1 drop into both eyes daily. 07/10/18   [provider]  Brinzolamide-Brimonidine (SIMBRINZA) 1-0.2 % SUSP Apply 1 drop to eye daily.    [provider]  clonazepam (KLONOPIN) 0.25 MG disintegrating tablet Take 1 tablet (0.25 mg total) by mouth 2 (two) times daily. 02/09/2019   Dustin Flock, MD  donepezil (ARICEPT) 10 MG tablet Take 10 mg by mouth at bedtime.  02/21/13   [provider]  latanoprost (XALATAN) 0.005  % ophthalmic solution Place 1 drop into the right eye at bedtime. 12/17/16   Dustin Flock, MD  LORazepam (ATIVAN) 2 MG/ML injection Inject 0.5 mLs (1 mg total) into the vein every 4 (four) hours as needed for seizure. 01/24/2019   Dustin Flock, MD  valproate 518 mg in dextrose 5 % 50 mL Inject 518 mg into the vein every 8 (eight) hours. 01/30/2019   Dustin Flock, MD     Critical care time: 26 min's required due to acute hypoxic respiratory failure, acute encephalopathy, aspiration, goals of care planning.      Georgann Housekeeper,  AGACNP-BC  [place] Pager [phone] or [phone]  02-26-2019 10:58 AM

## 2019-03-19 NOTE — Progress Notes (Signed)
Patient made DNR at 1007 and [place] initiated. Advised Family to visit and obtained authorization for up to four person. Son Gerald Stabs, his Wife, Daughter Alleen Borne arrived to floor around noon. All present at the bedside along with this Writer at the time patient expired. Pronounced at 1430 by Leveda Anna, RN and Dominica Severin, RN Post mortem Care and checklist completed. Remains sent to the [facility] to release to [employer] per family. Elita Boone, BSN, RN

## 2019-03-19 NOTE — Progress Notes (Signed)
OT Cancellation Note  Patient Details Name: Darrell Clark MRN: [id number] DOB: May 02, 1933   Cancelled Treatment:    Reason Eval/Treat Not Completed: Medical issues which prohibited therapy OT order received, at this time pt dyspneic at rest - respiratory rate up to 57 when sleeping. RN aware. Will hold OT at this time and initiate POC as available and appropriate.  Zenovia Jarred, MSOT, OTR/L Behavioral Health OT/ Acute Relief OT [facility] Office: [place] 03-11-2019, 8:34 AM

## 2019-03-19 NOTE — Progress Notes (Signed)
Palliative Medicine RN Note: Pt visit for symptom check. Son Gerald Stabs and his partner, pt's daughter at bedside. Spoke with PMT NP Kathie Rhodes during my visit.  Pt is breathing 45-50 times per minute, very labored. He got 2mg  morphine at 1220. BUE cold, BLE cool, no palpable pulse. Respirations are very wet.  Confirmed with family that comfort and dignity are the goal, as we no longer have curative options for Darrell Clark. Spoke with Darol Destine, who adjusted medication regimen. Spoke with RN & family to update them on changes. RN will bring prn dilaudid now, and I asked her to give a bolus dose once bag is up from pharmacy.   Marjie Skiff Juniel Groene, RN, BSN, [facility] Palliative Medicine Team 2019/03/13 2:38 PM Office [phone]

## 2019-03-19 NOTE — Death Summary Note (Signed)
Death Summary  Darrell Clark [id number] DOB: 11/16/1932 DOA: March 13, 2019  PCP: Darrell Pink, MD  Admit date: Mar 13, 2019 Date of Death: 03-20-2019 Time of Death: Oct 09, 1428 Notification: Darrell Pink, MD notified of death of 03-20-19   History of present illness:  Darrell Clark is a 85 y.o. male with history of dementia, chronic diastolic CHF, CAD, PAD, DM-2, HTN, HLD, prostate cancer and obesity who presented to [facility] on 02/11/19 with seizure and transferred to [facility] on Mar 13, 2019 for EEG and neurology evaluation.  Patient was loaded with Depakote 20 mg/KG and continued on 50 mg/KG/day in 3 divided doses at [facility].  CT head w/o contrast at [facility] revealed interval development of ill-defined right-sided subcortical hypodensities concerning for infarction or edema. MRI brain W/O contrast revealed cortical subcortical edema within right occipital lobe at the right frontoparietal junction concerning for subacute infarction or neoplastic process. MRI brain W/contrast showed moderate area of enhancement in the right occipital lobe and small areas of enhancement in the right frontoparietal white matter favoring mass especially GBM and a subacute right PCA finding concerning for mass versus infarct.  On arrival at [facility], evaluated by neurology. Stat EEG and LTM EEG were ordered. He was loaded with Keppra 2000 mg IV once. Continued on Depakote 500 mg 3 times daily, Keppra 500 mg twice daily, and Klonopin 0.25 mg 3 times daily. He was also started on IV dexamethasone 4 mg every 6 hours.  EEG revealed epileptiform discharge in the right posterior quadrant. Was seen by neuro-oncology.  LTM EEG dced on 02/18/19.  Overall poor prognosis with persistent encephalopathy.  Was seen by palliative care team.  On 2019-03-20, patient developed respiratory failure and family made decision for Com[facility].   Darrell Clark expired on 03-20-2019 at 1430.  His son and daughter were present in the room.    Final Diagnoses:    1.   Cardiopulmonary arrest 2.  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  Possible new late acute/early subacute occipital and right frontoparietal stroke 4.  Encephalopathy   The results of significant diagnostics from this hospitalization (including imaging, microbiology, ancillary and laboratory) are listed below for reference.    Significant Diagnostic Studies: Ct Angio Head W Or Wo Contrast  Result Date: 02/11/2019 CLINICAL DATA:  Seizure-like activity. EXAM: CT ANGIOGRAPHY HEAD TECHNIQUE: Multidetector CT imaging of the head was performed using the standard protocol during bolus administration of intravenous contrast. Multiplanar CT image reconstructions and MIPs were obtained to evaluate the vascular anatomy. CONTRAST:  9mL OMNIPAQUE IOHEXOL 350 MG/ML SOLN COMPARISON:  04/15/2016 FINDINGS: Anterior circulation: The internal carotid arteries are patent from skull base to carotid termini. Calcified plaque results in mild-to-moderate proximal supraclinoid stenosis bilaterally with detailed assessment limited by motion artifact. The MCAs are patent without evidence of proximal branch occlusion or significant M1 stenosis. Focal calcified plaque or a chronic embolus in the proximal left M2 inferior division is unchanged with associated moderate stenosis. Both A1 segments are widely patent. There is unchanged chronic occlusion of the left A2 segment. No aneurysm is identified. Posterior circulation: The visualized distal vertebral arteries are patent to the basilar with the left being dominant. There are severe multifocal right and distal left V4 stenoses which are similar to the prior CTA. The basilar artery is patent with moderate to severe multifocal stenosis. Patent SCA is are seen bilaterally. They are fetal origins of both PCAs with moderate branch vessel irregularity but no evidence of flow limiting proximal stenosis. No aneurysm is identified. Venous sinuses: Poorly evaluated due  to arterial phase contrast  timing. Anatomic variants: Fetal PCAs. IMPRESSION: 1. No major intracranial arterial occlusion. 2. 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 3. Chronic left A2 occlusion. Electronically Signed   By: Darrell Clark M.D.   On: 02/11/2019 18:26   Ct Head Wo Contrast  Result Date: 03-20-19 CLINICAL DATA:  Altered mental status EXAM: CT HEAD WITHOUT CONTRAST TECHNIQUE: Contiguous axial images were obtained from the base of the skull through the vertex without intravenous contrast. COMPARISON:  MRI from 01/31/2019 FINDINGS: Brain: Mild atrophic changes are noted commenced with the patient's given age. Chronic white matter ischemic changes are seen. The area of abnormality in the right occipital lobe is less well appreciated on this noncontrast CT. No areas of hemorrhage are seen. No infarct is noted. Vascular: No hyperdense vessel or unexpected calcification. Skull: Normal. Negative for fracture or focal lesion. Sinuses/Orbits: No acute finding. Other: None. IMPRESSION: Mild atrophic changes. The area of abnormality seen on recent MRI is not as well appreciated on this noncontrast head CT. Electronically Signed   By: Darrell Clark M.D.   On: Mar 20, 2019 10:52   Ct Head Wo Contrast  Result Date: 02/11/2019 CLINICAL DATA:  Seizures. EXAM: CT HEAD WITHOUT CONTRAST TECHNIQUE: Contiguous axial images were obtained from the base of the skull through the vertex without intravenous contrast. COMPARISON:  CT scan of June 22, 2018. FINDINGS: Brain: Ventricular size is within normal limits. No midline shift is noted. Interval development of several right-sided subcortical white matter low densities are noted concerning for infarction of indeterminate age or edema due to other pathology. No hemorrhage is noted. Vascular: No hyperdense vessel or unexpected calcification. Skull: Normal. Negative for fracture or focal lesion. Sinuses/Orbits: No  acute finding. Other: None. IMPRESSION: Interval development of several ill-defined right sided subcortical white matter low densities concerning for infarction of indeterminate age or edema due to other pathology. MRI is recommended for further evaluation. Electronically Signed   By: Marijo Conception M.D.   On: 02/11/2019 13:31   Mr Brain Wo Contrast  Result Date: 02/11/2019 CLINICAL DATA:  Recent fall.  Tremor. EXAM: MRI HEAD WITHOUT CONTRAST TECHNIQUE: Multiplanar, multiecho pulse sequences of the brain and surrounding structures were obtained without intravenous contrast. COMPARISON:  Head CT 02/11/2019 Brain MRI 07/12/2018 FINDINGS: BRAIN: There is no acute infarct, acute hemorrhage or extra-axial collection. There is a new area hyperintense T2-weighted signal within the right occipital cortex and within the subcortical white matter at the base of the pre and postcentral gyri. The cerebral and cerebellar volume are age-appropriate. There is no hydrocephalus. VASCULAR: The major intracranial arterial and venous sinus flow voids are normal. Susceptibility-sensitive sequences show no chronic microhemorrhage or superficial siderosis. SKULL AND UPPER CERVICAL SPINE: Calvarial bone marrow signal is normal. There is no skull base mass. The visualized upper cervical spine and soft tissues are normal. SINUSES/ORBITS: There are no fluid levels or advanced mucosal thickening. The mastoid air cells and middle ear cavities are free of fluid. The orbits are normal. IMPRESSION: 1. New areas of cortical/subcortical edema within the right occipital lobe and at the right frontoparietal junction. No associated diffusion restriction. These may indicate areas of subacute infarction. A neoplastic or infectious/inflammatory process could also cause these findings. Further imaging with intravenous contrast administration may be helpful. 2. Chronic small vessel ischemia. Electronically Signed   By: Ulyses Jarred M.D.   On:  02/11/2019 22:53   Mr Brain W Contrast  Result Date: 02/01/2019 CLINICAL DATA:  Subacute neuro deficit EXAM: MRI HEAD WITH CONTRAST TECHNIQUE: Multiplanar, multiecho pulse sequences of the brain and surrounding structures were obtained with intravenous contrast. CONTRAST:  10 cc Gadavist intravenous COMPARISON:  Brain MRI from yesterday and CT CTA from the day before. Brain MRI 07/12/2018 FINDINGS: Moderate area of contiguous enhancement in the right occipital lobe extending from the occipital horn of the ventricle to the cortex. There is central non enhancement at this level. There are aligned patchy areas of white matter enhancement in the right posterior frontal and parietal region. Enhancement is much less extensive than the extent of T2 and FLAIR signal. No other areas of enhancement. IMPRESSION: Moderate area of enhancement in the right occipital lobe and small areas of enhancement in the right frontal parietal white matter which is less extensive than the extent of T2 signal. A mass is favored, especially glioblastoma. Subacute right PCA infarct with watershed ischemia in the right cerebral white matter is a differential consideration, but would not expect the necrotic enhancement pattern at the level of the occipital lobe. Follow-up in 2-3 weeks may be helpful. Electronically Signed   By: Monte Fantasia M.D.   On: 01/17/2019 11:38   Dg Chest Port 1 View  Result Date: Mar 03, 2019 CLINICAL DATA:  Abnormal respirations, history coronary artery disease, CHF, diabetes mellitus, GERD, hypertension, prostate cancer EXAM: PORTABLE CHEST 1 VIEW COMPARISON:  Portable exam 0853 hours compared to 07/12/2018 FINDINGS: Upper normal heart size. Mediastinal contours and pulmonary vascularity normal. Atherosclerotic calcification aorta. Bibasilar atelectasis. Remaining lungs clear. No pleural effusion or pneumothorax. Bones unremarkable. Slight rotation to the LEFT. IMPRESSION: Bibasilar atelectasis. Electronically  Signed   By: Lavonia Dana M.D.   On: 03-03-2019 08:59    Microbiology: Recent Results (from the past 240 hour(s))  Novel Coronavirus, NAA (Hosp order, Send-out to Ref Lab; TAT 18-24 hrs     Status: None   Collection Time: 02/11/19  1:40 PM   Specimen: Nasopharyngeal Swab; Respiratory  Result Value Ref Range Status   SARS-CoV-2, NAA NOT DETECTED NOT DETECTED Final    Comment: (NOTE) This test was developed and its performance characteristics determined by [employer]. This test has not been FDA cleared or approved. This test has been authorized by FDA under an Emergency Use Authorization (EUA). This test is only authorized for the duration of time the declaration that circumstances exist justifying the authorization of the emergency use of in vitro diagnostic tests for detection of SARS-CoV-2 virus and/or diagnosis of COVID-19 infection under section 564(b)(1) of the Act, 21 U.S.C. KA:123727), unless the authorization is terminated or revoked sooner. When diagnostic testing is negative, the possibility of a false negative result should be considered in the context of a patient's recent exposures and the presence of clinical signs and symptoms consistent with COVID-19. An individual without symptoms of COVID-19 and who is not shedding SARS-CoV-2 virus would expect to have a negative (not detected) result in this assay. Performed  At: [facility] [address] [place], [place] [id number] Rush Farmer MD [id number]    [place]  Final    Comment: Performed at [facility], [address] [place], [place] [address]  MRSA PCR Screening     Status: None   Collection Time: 02/13/19 12:23 AM   Specimen: Nasal Mucosa; Nasopharyngeal  Result Value Ref Range Status   MRSA by PCR NEGATIVE NEGATIVE Final    Comment:        The GeneXpert MRSA  Assay (FDA approved for NASAL specimens only), is one component of a comprehensive MRSA  colonization surveillance program. It is not intended to diagnose MRSA infection nor to guide or monitor treatment for MRSA infections. Performed at [facility], [place] [address] [place], [place] [address]      Labs: Basic Metabolic Panel: Recent Labs  Lab 02/13/19 0101 02/13/19 [id number] 02/16/19 0727 02/17/19 0634 02/18/19 0619 Mar 18, 2019 1124  NA 136 136 142 143 144 151*  K 4.5 4.4 4.7 4.8 4.8 4.7  CL 100 101 104 106 102 105  CO2 22 21* 22 24 24 23   GLUCOSE 173* 177* 206* 204* 178* 227*  BUN 20 21 43* 39* 38* 65*  CREATININE 1.08 1.06 1.26* 1.25* 1.12 2.21*  CALCIUM 9.7 9.8 9.0 9.0 9.4 9.7  MG 2.0  --  2.8*  --   --  3.4*  PHOS 3.8  --  4.6  --   --  6.2*   Liver Function Tests: Recent Labs  Lab 02/13/19 0101 02/13/19 0656 03/18/2019 1124  AST  --  64* 33  ALT  --  26 35  ALKPHOS  --  71 70  BILITOT  --  1.2 2.1*  PROT  --  7.3 7.6  ALBUMIN 3.6 3.7 3.6   No results for input(s): LIPASE, AMYLASE in the last 168 hours. Recent Labs  Lab 02/15/19 0657 02/16/19 1712  AMMONIA 37* 24   CBC: Recent Labs  Lab 02/13/19 0101 02/16/19 0727 18-Mar-2019 1124  WBC 8.7 10.0 17.1*  NEUTROABS 7.7  --  14.2*  HGB 16.3 18.2* 22.6*  HCT 48.7 56.2* 71.0*  MCV 93.1 95.7 98.2  PLT 194 179 160   Cardiac Enzymes: No results for input(s): CKTOTAL, CKMB, CKMBINDEX, TROPONINI in the last 168 hours. D-Dimer No results for input(s): DDIMER in the last 72 hours. BNP: Invalid input(s): POCBNP CBG: Recent Labs  Lab 02/18/19 1113 02/18/19 1756 02/18/19 2144 Mar 18, 2019 0738 03/18/2019 1150  GLUCAP 257* 198* 243* 232* 207*   Anemia work up No results for input(s): VITAMINB12, FOLATE, FERRITIN, TIBC, IRON, RETICCTPCT in the last 72 hours. Urinalysis    Component Value Date/Time   COLORURINE YELLOW (A) 02/11/2019 1340   APPEARANCEUR CLEAR (A) 02/11/2019 1340   APPEARANCEUR Clear 01/19/2013 1937   LABSPEC 1.019 02/11/2019 1340   LABSPEC 1.017 01/19/2013 1937   PHURINE 5.0  02/11/2019 1340   GLUCOSEU NEGATIVE 02/11/2019 1340   GLUCOSEU Negative 01/19/2013 1937   HGBUR NEGATIVE 02/11/2019 1340   BILIRUBINUR NEGATIVE 02/11/2019 1340   BILIRUBINUR Negative 01/19/2013 1937   KETONESUR 5 (A) 02/11/2019 1340   PROTEINUR NEGATIVE 02/11/2019 1340   NITRITE NEGATIVE 02/11/2019 1340   LEUKOCYTESUR NEGATIVE 02/11/2019 1340   LEUKOCYTESUR Negative 01/19/2013 1937   Sepsis Labs Invalid input(s): PROCALCITONIN,  WBC,  LACTICIDVEN     SIGNED:  Kayleen Memos, MD  [facility] 03/18/2019, 3:07 PM Pager   If 7PM-7AM, please contact night-coverage [email or web address] Password [email or web address]

## 2019-03-19 NOTE — Significant Event (Addendum)
Rapid Response Event Note  Overview: MEWS 5   Initial Focused Assessment: Primary nurse called me and informed that patient has a MEWS - 5. Per nurse she did not need me to see the patient. I came to see the patient at [place], nurse informed me that [facility] MD had ordered ABG/CXR and consulted PCCM. I went in to see Mr. Mercedes, his RR was in the upper 40s - lower 50s, he was diaphoretic, very cold to touch, HR in the 115s, SBP 100, and 93% on 2L [place]. Rhonchi throughout - thick white secretions were suctioned multiple times from the oropharynx. Very weak gag, very weak cough, CR present. Unresponsive overall. Per nurse, patient was having periods of apnea. Appears to be actively dying.   Interventions: -- Deep suctioning   Plan of Care: -- PCCM NP spoke with the son/family, decision made to transition patient to DNR, MD to order Morphine for dyspnea/comfort. Family is on the way to the hospital to see the patient.   Event Summary:  Start Time 0940 End Time [place], [place]

## 2019-03-19 NOTE — Progress Notes (Signed)
Reason for consult: seizure  Subjective: Patient appears dyspneic. Continues to be encephalopathic, not responding to commands.  ROS: Unable to obtain due to poor mental status  Examination  Vital signs in last 24 hours: Temp:  [97.6 F (36.4 C)-100.9 F (38.3 C)] 100.9 F (38.3 C) (09/03 1153) Pulse Rate:  [33-105] 33 (09/03 0741) Resp:  [21-46] 29 (09/03 0741) BP: (126-185)/(86-118) 126/99 (09/03 0735) SpO2:  [93 %-99 %] 93 % (09/03 0741)  General: lying in bed,not in apparent distress CVS: pulse-normal rate and rhythm RS: tachypneic Extremities: +pedal edema, cold  Neuro: [id number],  does not follow commands CN: pupils equal and reactive, oculocephalic reflex intact,face symmetric Motor:Left upper and lower extremity doesn't withdraw to noxious stimuli,right upper and lower extremity withdraws to noxious stimuli with antigravity strength Reflexes: 1+ bilaterally over patella, biceps Coordination:Unable to assess as patient is unable to follow commands Gait: not tested   Basic Metabolic Panel: Recent Labs  Lab 02/13/19 0101 02/13/19 0656 02/16/19 0727 02/17/19 0634 02/18/19 0619 11-Mar-2019 1124  NA 136 136 142 143 144 151*  K 4.5 4.4 4.7 4.8 4.8 4.7  CL 100 101 104 106 102 105  CO2 22 21* 22 24 24 23   GLUCOSE 173* 177* 206* 204* 178* 227*  BUN 20 21 43* 39* 38* 65*  CREATININE 1.08 1.06 1.26* 1.25* 1.12 2.21*  CALCIUM 9.7 9.8 9.0 9.0 9.4 9.7  MG 2.0  --  2.8*  --   --  3.4*  PHOS 3.8  --  4.6  --   --  6.2*    CBC: Recent Labs  Lab 02/13/19 0101 02/16/19 0727 2019-03-11 1124  WBC 8.7 10.0 17.1*  NEUTROABS 7.7  --  14.2*  HGB 16.3 18.2* 22.6*  HCT 48.7 56.2* 71.0*  MCV 93.1 95.7 98.2  PLT 194 179 160     Coagulation Studies: No results for input(s): LABPROT, INR in the last 72 hours.  Imaging MRI brain with and without contrast:Moderate area of enhancement in the right occipital lobe and small areas of enhancement in the right frontal  parietal white matter which is less extensive than the extent of T2 signal. A mass is favored, especially glioblastoma. Subacute right PCA infarct with watershed ischemia in the right cerebral white matter is a ifferential consideration, but would not expect the necrotic enhancement pattern at the level of the occipital lobe   ASSESSMENT AND PLAN 85 year old man with above past medical history presentedwith left side jerking.MRI concerning for GBM in the right occipital lobe plus/minus stroke in that area as well.Patient was in focal status epilepticus which has since resolved.  Epilepsia partialis continua( resolved) Focal seizures Possible new GBM Possible new late acute/early subacute occipital and right frontoparietal stroke Encephalopathy -Patient was more encephalopathic today. He was also tachypneic. Ordered CT head which didn't show any worsening cerebral edema. Critical care team evaluated and dicussed goals of care with patient family. Patient ws made DNR/DNI with goal to eventually transition to comfort care.   Recommendations: - Will continue AEDs as follow 1. Keppra 1500mg  BID 2. Phenytoin 100mg  TID: will check levels every other day and titrate as needed - AS patient is now DNR/DNI and may transition to comfort care, will also hold VPA to minimize sedation.  -Continue to use clonazepam0.5mg  as needed for seizure recurrence. -Continue seizure precautions -PRN IV Ativan 2 mg for generalized tonic-clonic seizure lasting more than 2 minutes  Thank you for allowing [place] to participate in the care of this patient.  Please  page neuro hospitalist on call for any further questions.  Tehilla Coffel Barbra Sarks

## 2019-03-19 DEATH — deceased

## 2019-04-15 ENCOUNTER — Ambulatory Visit: Payer: Medicare Other | Admitting: Podiatry
